# Patient Record
Sex: Male | Born: 1965
Health system: Southern US, Community
[De-identification: ages and names within clinical notes are randomized; demographics above are authoritative.]

## PROBLEM LIST (undated history)

## (undated) DIAGNOSIS — M7710 Lateral epicondylitis, unspecified elbow: Secondary | ICD-10-CM

## (undated) DIAGNOSIS — R918 Other nonspecific abnormal finding of lung field: Secondary | ICD-10-CM

## (undated) DIAGNOSIS — M5126 Other intervertebral disc displacement, lumbar region: Secondary | ICD-10-CM

## (undated) DIAGNOSIS — K222 Esophageal obstruction: Secondary | ICD-10-CM

## (undated) DIAGNOSIS — J45991 Cough variant asthma: Secondary | ICD-10-CM

## (undated) DIAGNOSIS — M5416 Radiculopathy, lumbar region: Secondary | ICD-10-CM

## (undated) DIAGNOSIS — K219 Gastro-esophageal reflux disease without esophagitis: Secondary | ICD-10-CM

## (undated) DIAGNOSIS — Z5189 Encounter for other specified aftercare: Secondary | ICD-10-CM

## (undated) DIAGNOSIS — K449 Diaphragmatic hernia without obstruction or gangrene: Secondary | ICD-10-CM

## (undated) DIAGNOSIS — Z86010 Personal history of colonic polyps: Secondary | ICD-10-CM

## (undated) DIAGNOSIS — Z9889 Other specified postprocedural states: Secondary | ICD-10-CM

## (undated) DIAGNOSIS — T7840XA Allergy, unspecified, initial encounter: Secondary | ICD-10-CM

## (undated) DIAGNOSIS — R945 Abnormal results of liver function studies: Secondary | ICD-10-CM

## (undated) DIAGNOSIS — I10 Essential (primary) hypertension: Secondary | ICD-10-CM

## (undated) DIAGNOSIS — K589 Irritable bowel syndrome without diarrhea: Secondary | ICD-10-CM

## (undated) DIAGNOSIS — R112 Nausea with vomiting, unspecified: Secondary | ICD-10-CM

## (undated) DIAGNOSIS — E785 Hyperlipidemia, unspecified: Secondary | ICD-10-CM

## (undated) DIAGNOSIS — K769 Liver disease, unspecified: Secondary | ICD-10-CM

## (undated) DIAGNOSIS — R7989 Other specified abnormal findings of blood chemistry: Secondary | ICD-10-CM

## (undated) DIAGNOSIS — E538 Deficiency of other specified B group vitamins: Secondary | ICD-10-CM

## (undated) HISTORY — DX: Cough variant asthma: J45.991

## (undated) HISTORY — DX: Irritable bowel syndrome, unspecified: K58.9

## (undated) HISTORY — PX: POLYPECTOMY: SHX149

## (undated) HISTORY — PX: COLON SURGERY: SHX602

## (undated) HISTORY — PX: HERNIA REPAIR: SHX51

## (undated) HISTORY — PX: LUMBAR LAMINECTOMY: SHX95

## (undated) HISTORY — DX: Diaphragmatic hernia without obstruction or gangrene: K44.9

## (undated) HISTORY — DX: Essential (primary) hypertension: I10

## (undated) HISTORY — DX: Hyperlipidemia, unspecified: E78.5

## (undated) HISTORY — DX: Other specified abnormal findings of blood chemistry: R79.89

## (undated) HISTORY — PX: SPINE SURGERY: SHX786

## (undated) HISTORY — DX: Lateral epicondylitis, unspecified elbow: M77.10

## (undated) HISTORY — DX: Gastro-esophageal reflux disease without esophagitis: K21.9

## (undated) HISTORY — DX: Deficiency of other specified B group vitamins: E53.8

## (undated) HISTORY — DX: Esophageal obstruction: K22.2

## (undated) HISTORY — DX: Personal history of colonic polyps: Z86.010

## (undated) HISTORY — PX: LEG SURGERY: SHX1003

## (undated) HISTORY — DX: Liver disease, unspecified: K76.9

## (undated) HISTORY — PX: VENTRAL HERNIA REPAIR: SHX424

## (undated) HISTORY — DX: Abnormal results of liver function studies: R94.5

## (undated) HISTORY — DX: Encounter for other specified aftercare: Z51.89

## (undated) HISTORY — PX: UPPER GASTROINTESTINAL ENDOSCOPY: SHX188

## (undated) HISTORY — PX: COLONOSCOPY: SHX174

## (undated) HISTORY — DX: Allergy, unspecified, initial encounter: T78.40XA

## (undated) HISTORY — PX: OTHER SURGICAL HISTORY: SHX169

---
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
## 1999-07-27 ENCOUNTER — Encounter: Payer: Self-pay | Admitting: Emergency Medicine

## 1999-07-27 ENCOUNTER — Inpatient Hospital Stay (HOSPITAL_COMMUNITY): Admission: EM | Admit: 1999-07-27 | Discharge: 1999-08-04 | Payer: Self-pay | Admitting: Emergency Medicine

## 1999-07-27 ENCOUNTER — Encounter (INDEPENDENT_AMBULATORY_CARE_PROVIDER_SITE_OTHER): Payer: Self-pay | Admitting: *Deleted

## 1999-07-28 ENCOUNTER — Encounter: Payer: Self-pay | Admitting: Orthopaedic Surgery

## 2000-05-22 ENCOUNTER — Encounter (INDEPENDENT_AMBULATORY_CARE_PROVIDER_SITE_OTHER): Payer: Self-pay | Admitting: *Deleted

## 2000-05-23 ENCOUNTER — Inpatient Hospital Stay (HOSPITAL_COMMUNITY): Admission: RE | Admit: 2000-05-23 | Discharge: 2000-05-24 | Payer: Self-pay | Admitting: Surgery

## 2000-10-29 ENCOUNTER — Ambulatory Visit (HOSPITAL_COMMUNITY): Admission: RE | Admit: 2000-10-29 | Discharge: 2000-10-29 | Payer: Self-pay | Admitting: Neurosurgery

## 2000-10-29 ENCOUNTER — Encounter: Payer: Self-pay | Admitting: Neurosurgery

## 2000-11-12 ENCOUNTER — Encounter: Payer: Self-pay | Admitting: Neurosurgery

## 2000-11-12 ENCOUNTER — Ambulatory Visit (HOSPITAL_COMMUNITY): Admission: RE | Admit: 2000-11-12 | Discharge: 2000-11-12 | Payer: Self-pay | Admitting: Neurosurgery

## 2000-11-26 ENCOUNTER — Ambulatory Visit (HOSPITAL_COMMUNITY): Admission: RE | Admit: 2000-11-26 | Discharge: 2000-11-26 | Payer: Self-pay | Admitting: Neurosurgery

## 2000-11-26 ENCOUNTER — Encounter: Payer: Self-pay | Admitting: Neurosurgery

## 2003-10-17 ENCOUNTER — Encounter (INDEPENDENT_AMBULATORY_CARE_PROVIDER_SITE_OTHER): Payer: Self-pay | Admitting: *Deleted

## 2003-10-17 ENCOUNTER — Inpatient Hospital Stay (HOSPITAL_COMMUNITY): Admission: RE | Admit: 2003-10-17 | Discharge: 2003-10-19 | Payer: Self-pay | Admitting: Surgery

## 2004-07-24 ENCOUNTER — Ambulatory Visit: Payer: Self-pay | Admitting: Internal Medicine

## 2004-09-03 ENCOUNTER — Ambulatory Visit: Payer: Self-pay | Admitting: Internal Medicine

## 2004-09-27 ENCOUNTER — Encounter: Admission: RE | Admit: 2004-09-27 | Discharge: 2004-10-17 | Payer: Self-pay | Admitting: Internal Medicine

## 2004-12-26 ENCOUNTER — Ambulatory Visit: Payer: Self-pay | Admitting: Internal Medicine

## 2005-01-02 ENCOUNTER — Ambulatory Visit: Payer: Self-pay | Admitting: Internal Medicine

## 2005-01-29 ENCOUNTER — Ambulatory Visit: Payer: Self-pay | Admitting: Internal Medicine

## 2005-03-06 ENCOUNTER — Emergency Department (HOSPITAL_COMMUNITY): Admission: EM | Admit: 2005-03-06 | Discharge: 2005-03-06 | Payer: Self-pay | Admitting: *Deleted

## 2005-05-22 ENCOUNTER — Ambulatory Visit: Payer: Self-pay | Admitting: Internal Medicine

## 2005-07-18 ENCOUNTER — Ambulatory Visit: Payer: Self-pay | Admitting: Internal Medicine

## 2005-07-29 ENCOUNTER — Ambulatory Visit: Payer: Self-pay | Admitting: Internal Medicine

## 2005-08-19 ENCOUNTER — Ambulatory Visit: Payer: Self-pay | Admitting: Internal Medicine

## 2005-10-16 ENCOUNTER — Ambulatory Visit: Payer: Self-pay | Admitting: Internal Medicine

## 2005-11-27 ENCOUNTER — Encounter: Admission: RE | Admit: 2005-11-27 | Discharge: 2005-11-27 | Payer: Self-pay | Admitting: Occupational Medicine

## 2006-02-05 ENCOUNTER — Ambulatory Visit: Payer: Self-pay | Admitting: Internal Medicine

## 2006-04-15 ENCOUNTER — Ambulatory Visit: Payer: Self-pay | Admitting: Internal Medicine

## 2006-05-01 ENCOUNTER — Ambulatory Visit: Payer: Self-pay | Admitting: Internal Medicine

## 2006-05-07 ENCOUNTER — Encounter: Admission: RE | Admit: 2006-05-07 | Discharge: 2006-05-07 | Payer: Self-pay | Admitting: Internal Medicine

## 2006-11-03 ENCOUNTER — Ambulatory Visit (HOSPITAL_COMMUNITY): Admission: RE | Admit: 2006-11-03 | Discharge: 2006-11-05 | Payer: Self-pay | Admitting: Neurosurgery

## 2007-03-05 ENCOUNTER — Ambulatory Visit: Payer: Self-pay | Admitting: Internal Medicine

## 2007-03-05 LAB — CONVERTED CEMR LAB
ALT: 40 units/L (ref 0–40)
Albumin: 4.4 g/dL (ref 3.5–5.2)
Bilirubin, Direct: 0.1 mg/dL (ref 0.0–0.3)
CO2: 32 meq/L (ref 19–32)
Direct LDL: 170.9 mg/dL
GFR calc non Af Amer: 87 mL/min
HDL: 38.6 mg/dL — ABNORMAL LOW (ref >39.0)
Lymphocytes Relative: 29.2 % (ref 12.0–46.0)
MCV: 91.2 fL (ref 78.0–100.0)
Monocytes Relative: 8.3 % (ref 3.0–11.0)
Neutro Abs: 3.4 10*3/uL (ref 1.4–7.7)
Neutrophils Relative %: 59.2 % (ref 43.0–77.0)
Platelets: 257 10*3/uL (ref 150–400)
Potassium: 4.2 meq/L (ref 3.5–5.1)
RBC: 5.05 M/uL (ref 4.22–5.81)
RDW: 11.8 % (ref 11.5–14.6)
Sodium: 143 meq/L (ref 135–145)
Total Bilirubin: 1.2 mg/dL (ref 0.3–1.2)
Total CHOL/HDL Ratio: 6.6
Total Protein: 7.1 g/dL (ref 6.0–8.3)
VLDL: 32 mg/dL (ref 0–40)

## 2007-03-12 ENCOUNTER — Ambulatory Visit: Payer: Self-pay | Admitting: Internal Medicine

## 2007-05-12 DIAGNOSIS — E785 Hyperlipidemia, unspecified: Secondary | ICD-10-CM | POA: Insufficient documentation

## 2007-05-12 DIAGNOSIS — K219 Gastro-esophageal reflux disease without esophagitis: Secondary | ICD-10-CM | POA: Insufficient documentation

## 2007-08-24 ENCOUNTER — Ambulatory Visit: Payer: Self-pay | Admitting: Internal Medicine

## 2007-08-24 DIAGNOSIS — M545 Low back pain, unspecified: Secondary | ICD-10-CM | POA: Insufficient documentation

## 2007-09-02 ENCOUNTER — Encounter: Payer: Self-pay | Admitting: Internal Medicine

## 2007-09-02 ENCOUNTER — Encounter: Admission: RE | Admit: 2007-09-02 | Discharge: 2007-09-02 | Payer: Self-pay | Admitting: Neurosurgery

## 2007-09-03 ENCOUNTER — Ambulatory Visit: Payer: Self-pay | Admitting: Internal Medicine

## 2007-09-15 ENCOUNTER — Encounter: Payer: Self-pay | Admitting: Internal Medicine

## 2007-09-27 ENCOUNTER — Encounter: Payer: Self-pay | Admitting: Internal Medicine

## 2007-10-12 ENCOUNTER — Ambulatory Visit: Payer: Self-pay | Admitting: Internal Medicine

## 2007-10-12 DIAGNOSIS — J45991 Cough variant asthma: Secondary | ICD-10-CM

## 2007-10-12 HISTORY — DX: Cough variant asthma: J45.991

## 2007-11-01 ENCOUNTER — Encounter: Payer: Self-pay | Admitting: Internal Medicine

## 2007-12-31 ENCOUNTER — Encounter: Payer: Self-pay | Admitting: Internal Medicine

## 2008-02-24 ENCOUNTER — Encounter: Payer: Self-pay | Admitting: Internal Medicine

## 2008-03-21 ENCOUNTER — Ambulatory Visit (HOSPITAL_COMMUNITY): Admission: RE | Admit: 2008-03-21 | Discharge: 2008-03-21 | Payer: Self-pay | Admitting: Neurosurgery

## 2008-04-03 ENCOUNTER — Encounter: Payer: Self-pay | Admitting: Internal Medicine

## 2008-05-08 ENCOUNTER — Encounter: Payer: Self-pay | Admitting: Internal Medicine

## 2008-06-07 ENCOUNTER — Ambulatory Visit: Payer: Self-pay | Admitting: Internal Medicine

## 2008-06-07 DIAGNOSIS — K644 Residual hemorrhoidal skin tags: Secondary | ICD-10-CM | POA: Insufficient documentation

## 2008-06-07 DIAGNOSIS — L255 Unspecified contact dermatitis due to plants, except food: Secondary | ICD-10-CM | POA: Insufficient documentation

## 2008-06-07 DIAGNOSIS — K5909 Other constipation: Secondary | ICD-10-CM | POA: Insufficient documentation

## 2008-08-14 ENCOUNTER — Encounter: Payer: Self-pay | Admitting: Internal Medicine

## 2008-08-18 ENCOUNTER — Ambulatory Visit: Payer: Self-pay | Admitting: Internal Medicine

## 2008-08-18 LAB — CONVERTED CEMR LAB
BUN: 20 mg/dL (ref 6–23)
Bilirubin Urine: NEGATIVE
Blood in Urine, dipstick: NEGATIVE
CO2: 30 meq/L (ref 19–32)
Calcium: 8.9 mg/dL (ref 8.4–10.5)
Chloride: 106 meq/L (ref 96–112)
Creatinine, Ser: 1.1 mg/dL (ref 0.4–1.5)
Direct LDL: 133.7 mg/dL
GFR calc Af Amer: 94 mL/min
GFR calc non Af Amer: 77 mL/min
HCT: 42.1 % (ref 39.0–52.0)
Ketones, urine, test strip: NEGATIVE
Lymphocytes Relative: 30 % (ref 12.0–46.0)
MCHC: 35 g/dL (ref 30.0–36.0)
MCV: 90.5 fL (ref 78.0–100.0)
Monocytes Absolute: 0.5 10*3/uL (ref 0.1–1.0)
Monocytes Relative: 8 % (ref 3.0–12.0)
Platelets: 193 10*3/uL (ref 150–400)
RDW: 11.7 % (ref 11.5–14.6)
Sodium: 142 meq/L (ref 135–145)
Specific Gravity, Urine: 1.01
Total Bilirubin: 0.9 mg/dL (ref 0.3–1.2)
Total CHOL/HDL Ratio: 4.9
Total Protein: 6.5 g/dL (ref 6.0–8.3)
Triglycerides: 151 mg/dL — ABNORMAL HIGH (ref 0–149)
Urobilinogen, UA: 0.2
VLDL: 30 mg/dL (ref 0–40)
WBC Urine, dipstick: NEGATIVE

## 2008-08-25 ENCOUNTER — Ambulatory Visit: Payer: Self-pay | Admitting: Internal Medicine

## 2009-02-01 ENCOUNTER — Ambulatory Visit: Payer: Self-pay | Admitting: Family Medicine

## 2009-02-01 DIAGNOSIS — J029 Acute pharyngitis, unspecified: Secondary | ICD-10-CM | POA: Insufficient documentation

## 2009-02-01 DIAGNOSIS — J069 Acute upper respiratory infection, unspecified: Secondary | ICD-10-CM | POA: Insufficient documentation

## 2009-02-13 ENCOUNTER — Ambulatory Visit: Payer: Self-pay | Admitting: Internal Medicine

## 2009-02-13 LAB — CONVERTED CEMR LAB
ALT: 66 units/L — ABNORMAL HIGH (ref 0–53)
Alkaline Phosphatase: 64 units/L (ref 39–117)
Cholesterol: 245 mg/dL — ABNORMAL HIGH (ref 0–200)
HDL: 45.3 mg/dL (ref >39.00)
Total Bilirubin: 1.1 mg/dL (ref 0.3–1.2)
Total CHOL/HDL Ratio: 5
VLDL: 55.6 mg/dL — ABNORMAL HIGH (ref 0.0–40.0)

## 2009-02-20 ENCOUNTER — Ambulatory Visit: Payer: Self-pay | Admitting: Internal Medicine

## 2009-04-30 ENCOUNTER — Telehealth: Payer: Self-pay | Admitting: Internal Medicine

## 2009-04-30 ENCOUNTER — Ambulatory Visit: Payer: Self-pay | Admitting: Internal Medicine

## 2009-04-30 DIAGNOSIS — T169XXA Foreign body in ear, unspecified ear, initial encounter: Secondary | ICD-10-CM | POA: Insufficient documentation

## 2009-04-30 DIAGNOSIS — M542 Cervicalgia: Secondary | ICD-10-CM | POA: Insufficient documentation

## 2009-04-30 DIAGNOSIS — IMO0002 Reserved for concepts with insufficient information to code with codable children: Secondary | ICD-10-CM | POA: Insufficient documentation

## 2009-07-24 ENCOUNTER — Ambulatory Visit: Payer: Self-pay | Admitting: Internal Medicine

## 2009-07-24 DIAGNOSIS — J301 Allergic rhinitis due to pollen: Secondary | ICD-10-CM | POA: Insufficient documentation

## 2009-07-26 ENCOUNTER — Encounter (INDEPENDENT_AMBULATORY_CARE_PROVIDER_SITE_OTHER): Payer: Self-pay | Admitting: *Deleted

## 2009-07-27 ENCOUNTER — Encounter (INDEPENDENT_AMBULATORY_CARE_PROVIDER_SITE_OTHER): Payer: Self-pay | Admitting: *Deleted

## 2009-08-06 ENCOUNTER — Encounter (INDEPENDENT_AMBULATORY_CARE_PROVIDER_SITE_OTHER): Payer: Self-pay | Admitting: *Deleted

## 2009-08-31 ENCOUNTER — Ambulatory Visit: Payer: Self-pay | Admitting: Gastroenterology

## 2009-08-31 ENCOUNTER — Encounter (INDEPENDENT_AMBULATORY_CARE_PROVIDER_SITE_OTHER): Payer: Self-pay | Admitting: *Deleted

## 2009-08-31 DIAGNOSIS — R1311 Dysphagia, oral phase: Secondary | ICD-10-CM | POA: Insufficient documentation

## 2009-08-31 DIAGNOSIS — R7401 Elevation of levels of liver transaminase levels: Secondary | ICD-10-CM | POA: Insufficient documentation

## 2009-08-31 DIAGNOSIS — R740 Nonspecific elevation of levels of transaminase and lactic acid dehydrogenase [LDH]: Secondary | ICD-10-CM

## 2009-08-31 DIAGNOSIS — K589 Irritable bowel syndrome without diarrhea: Secondary | ICD-10-CM | POA: Insufficient documentation

## 2009-08-31 DIAGNOSIS — R079 Chest pain, unspecified: Secondary | ICD-10-CM | POA: Insufficient documentation

## 2009-08-31 DIAGNOSIS — R197 Diarrhea, unspecified: Secondary | ICD-10-CM | POA: Insufficient documentation

## 2009-08-31 DIAGNOSIS — R110 Nausea: Secondary | ICD-10-CM | POA: Insufficient documentation

## 2009-08-31 LAB — CONVERTED CEMR LAB: Hepatitis B Surface Ag: NEGATIVE

## 2009-09-05 DIAGNOSIS — E538 Deficiency of other specified B group vitamins: Secondary | ICD-10-CM | POA: Insufficient documentation

## 2009-09-05 LAB — CONVERTED CEMR LAB
ALT: 83 units/L — ABNORMAL HIGH (ref 0–53)
AST: 54 units/L — ABNORMAL HIGH (ref 0–37)
Albumin: 4.6 g/dL (ref 3.5–5.2)
BUN: 16 mg/dL (ref 6–23)
Bilirubin, Direct: 0.2 mg/dL (ref 0.0–0.3)
CO2: 31 meq/L (ref 19–32)
Calcium: 9.7 mg/dL (ref 8.4–10.5)
Ferritin: 101.8 ng/mL (ref 22.0–322.0)
Folate: 10.5 ng/mL
GFR calc non Af Amer: 85.87 mL/min (ref >60)
HCT: 47.1 % (ref 39.0–52.0)
Hemoglobin: 16.4 g/dL (ref 13.0–17.0)
IgA: 197 mg/dL (ref 68–378)
Lymphs Abs: 1.5 10*3/uL (ref 0.7–4.0)
MCV: 92.1 fL (ref 78.0–100.0)
Monocytes Absolute: 0.4 10*3/uL (ref 0.1–1.0)
Neutro Abs: 3.3 10*3/uL (ref 1.4–7.7)
RBC: 5.11 M/uL (ref 4.22–5.81)
Saturation Ratios: 21.6 % (ref 20.0–50.0)
Sodium: 141 meq/L (ref 135–145)
TSH: 1.5 microintl units/mL (ref 0.35–5.50)
Total Protein: 7.3 g/dL (ref 6.0–8.3)

## 2009-09-07 ENCOUNTER — Ambulatory Visit: Payer: Self-pay | Admitting: Gastroenterology

## 2009-09-13 ENCOUNTER — Ambulatory Visit: Payer: Self-pay | Admitting: Gastroenterology

## 2009-09-24 ENCOUNTER — Ambulatory Visit: Payer: Self-pay | Admitting: Gastroenterology

## 2009-09-28 ENCOUNTER — Ambulatory Visit: Payer: Self-pay | Admitting: Gastroenterology

## 2009-09-28 DIAGNOSIS — Z860100 Personal history of colon polyps, unspecified: Secondary | ICD-10-CM

## 2009-09-28 HISTORY — DX: Personal history of colon polyps, unspecified: Z86.0100

## 2009-09-28 HISTORY — DX: Personal history of colonic polyps: Z86.010

## 2009-10-02 ENCOUNTER — Encounter: Payer: Self-pay | Admitting: Gastroenterology

## 2009-10-25 ENCOUNTER — Ambulatory Visit: Payer: Self-pay | Admitting: Gastroenterology

## 2009-11-22 ENCOUNTER — Ambulatory Visit: Payer: Self-pay | Admitting: Gastroenterology

## 2009-11-28 ENCOUNTER — Ambulatory Visit: Payer: Self-pay | Admitting: Family Medicine

## 2009-12-24 ENCOUNTER — Ambulatory Visit: Payer: Self-pay | Admitting: Gastroenterology

## 2010-01-23 ENCOUNTER — Ambulatory Visit: Payer: Self-pay | Admitting: Gastroenterology

## 2010-02-25 ENCOUNTER — Ambulatory Visit: Payer: Self-pay | Admitting: Gastroenterology

## 2010-03-07 ENCOUNTER — Ambulatory Visit: Payer: Self-pay | Admitting: Family Medicine

## 2010-03-07 DIAGNOSIS — M7710 Lateral epicondylitis, unspecified elbow: Secondary | ICD-10-CM

## 2010-03-07 HISTORY — DX: Lateral epicondylitis, unspecified elbow: M77.10

## 2010-03-26 ENCOUNTER — Ambulatory Visit: Payer: Self-pay | Admitting: Gastroenterology

## 2010-04-25 ENCOUNTER — Ambulatory Visit: Payer: Self-pay | Admitting: Gastroenterology

## 2010-05-23 ENCOUNTER — Ambulatory Visit: Payer: Self-pay | Admitting: Gastroenterology

## 2010-06-13 ENCOUNTER — Ambulatory Visit: Payer: Self-pay | Admitting: Internal Medicine

## 2010-06-20 ENCOUNTER — Ambulatory Visit: Payer: Self-pay | Admitting: Gastroenterology

## 2010-07-18 ENCOUNTER — Ambulatory Visit: Payer: Self-pay | Admitting: Gastroenterology

## 2010-08-14 ENCOUNTER — Ambulatory Visit: Payer: Self-pay | Admitting: Gastroenterology

## 2010-09-10 ENCOUNTER — Encounter: Payer: Self-pay | Admitting: Internal Medicine

## 2010-09-11 ENCOUNTER — Ambulatory Visit: Payer: Self-pay | Admitting: Gastroenterology

## 2010-09-11 ENCOUNTER — Telehealth: Payer: Self-pay | Admitting: Internal Medicine

## 2010-09-12 ENCOUNTER — Encounter: Payer: Self-pay | Admitting: Internal Medicine

## 2010-09-18 ENCOUNTER — Ambulatory Visit
Admission: RE | Admit: 2010-09-18 | Discharge: 2010-09-18 | Payer: Self-pay | Source: Home / Self Care | Attending: Family Medicine | Admitting: Family Medicine

## 2010-10-03 ENCOUNTER — Ambulatory Visit
Admission: RE | Admit: 2010-10-03 | Discharge: 2010-10-03 | Payer: Self-pay | Source: Home / Self Care | Attending: Gastroenterology | Admitting: Gastroenterology

## 2010-10-03 ENCOUNTER — Other Ambulatory Visit: Payer: Self-pay | Admitting: Gastroenterology

## 2010-10-03 DIAGNOSIS — Z86010 Personal history of colonic polyps: Secondary | ICD-10-CM | POA: Insufficient documentation

## 2010-10-03 LAB — HEPATIC FUNCTION PANEL
ALT: 61 U/L — ABNORMAL HIGH (ref 0–53)
AST: 34 U/L (ref 0–37)
Albumin: 4.3 g/dL (ref 3.5–5.2)
Alkaline Phosphatase: 86 U/L (ref 39–117)
Bilirubin, Direct: 0.1 mg/dL (ref 0.0–0.3)
Total Bilirubin: 0.8 mg/dL (ref 0.3–1.2)
Total Protein: 7.1 g/dL (ref 6.0–8.3)

## 2010-10-03 LAB — IBC PANEL
Iron: 116 ug/dL (ref 42–165)
Saturation Ratios: 24.2 % (ref 20.0–50.0)
Transferrin: 342.1 mg/dL (ref 212.0–360.0)

## 2010-10-03 LAB — CBC WITH DIFFERENTIAL/PLATELET
Basophils Absolute: 0 10*3/uL (ref 0.0–0.1)
Basophils Relative: 0.5 % (ref 0.0–3.0)
Eosinophils Absolute: 0.1 10*3/uL (ref 0.0–0.7)
Eosinophils Relative: 1.3 % (ref 0.0–5.0)
HCT: 45.3 % (ref 39.0–52.0)
Hemoglobin: 15.9 g/dL (ref 13.0–17.0)
Lymphocytes Relative: 25.4 % (ref 12.0–46.0)
Lymphs Abs: 1.4 10*3/uL (ref 0.7–4.0)
MCHC: 35 g/dL (ref 30.0–36.0)
MCV: 91.2 fl (ref 78.0–100.0)
Monocytes Absolute: 0.5 10*3/uL (ref 0.1–1.0)
Monocytes Relative: 8.8 % (ref 3.0–12.0)
Neutro Abs: 3.5 10*3/uL (ref 1.4–7.7)
Neutrophils Relative %: 64 % (ref 43.0–77.0)
Platelets: 229 10*3/uL (ref 150.0–400.0)
RBC: 4.97 Mil/uL (ref 4.22–5.81)
RDW: 13 % (ref 11.5–14.6)
WBC: 5.5 10*3/uL (ref 4.5–10.5)

## 2010-10-03 LAB — BASIC METABOLIC PANEL
BUN: 20 mg/dL (ref 6–23)
CO2: 31 mEq/L (ref 19–32)
Calcium: 9.3 mg/dL (ref 8.4–10.5)
Chloride: 100 mEq/L (ref 96–112)
Creatinine, Ser: 1 mg/dL (ref 0.4–1.5)
GFR: 85.45 mL/min (ref >60.00)
Glucose, Bld: 83 mg/dL (ref 70–99)
Potassium: 4.5 mEq/L (ref 3.5–5.1)
Sodium: 138 mEq/L (ref 135–145)

## 2010-10-03 LAB — FERRITIN: Ferritin: 106.1 ng/mL (ref 22.0–322.0)

## 2010-10-03 LAB — TSH: TSH: 1.68 u[IU]/mL (ref 0.35–5.50)

## 2010-10-03 LAB — FOLATE: Folate: 9.3 ng/mL

## 2010-10-03 LAB — VITAMIN B12: Vitamin B-12: 417 pg/mL (ref 211–911)

## 2010-10-07 ENCOUNTER — Ambulatory Visit
Admission: RE | Admit: 2010-10-07 | Discharge: 2010-10-07 | Payer: Self-pay | Source: Home / Self Care | Attending: Gastroenterology | Admitting: Gastroenterology

## 2010-10-07 ENCOUNTER — Encounter: Payer: Self-pay | Admitting: Gastroenterology

## 2010-10-07 LAB — CONVERTED CEMR LAB
Anti Nuclear Antibody(ANA): NEGATIVE
Ceruloplasmin: 26 mg/dL (ref 21–63)

## 2010-10-14 ENCOUNTER — Ambulatory Visit
Admission: RE | Admit: 2010-10-14 | Discharge: 2010-10-14 | Payer: Self-pay | Source: Home / Self Care | Attending: Internal Medicine | Admitting: Internal Medicine

## 2010-10-17 ENCOUNTER — Ambulatory Visit (HOSPITAL_COMMUNITY)
Admission: RE | Admit: 2010-10-17 | Discharge: 2010-10-17 | Payer: Self-pay | Source: Home / Self Care | Attending: Gastroenterology | Admitting: Gastroenterology

## 2010-10-17 ENCOUNTER — Encounter: Payer: Self-pay | Admitting: Gastroenterology

## 2010-10-21 ENCOUNTER — Ambulatory Visit (HOSPITAL_COMMUNITY)
Admission: RE | Admit: 2010-10-21 | Discharge: 2010-10-21 | Payer: Self-pay | Source: Home / Self Care | Attending: Gastroenterology | Admitting: Gastroenterology

## 2010-10-22 NOTE — Assessment & Plan Note (Signed)
Summary: elbow pain/njr   Vital Signs:  Patient profile:   45 year old male Height:      71 inches Weight:      192 pounds BMI:     26.88 Temp:     98.2 degrees F oral Pulse rate:   76 / minute Resp:     14 per minute BP sitting:   140 / 84  (left arm)  Vitals Entered By: Willy Eddy, LPN (June 13, 2010 11:41 AM) CC: c/o rt elbow pain- was given depo injection in June by Dr Caryl Never wchich helped and now pain has returned Is Patient Diabetic? No   Primary Care Provider:  Darryll Capers, MD  CC:  c/o rt elbow pain- was given depo injection in June by Dr Caryl Never wchich helped and now pain has returned.  History of Present Illness: Has been seen befor for epicodylitis prior injection helped but  the injury has recurred acute increase in pain no redness or cellulitis  Preventive Screening-Counseling & Management  Alcohol-Tobacco     Smoking Status: never     Tobacco Counseling: not indicated; no tobacco use  Problems Prior to Update: 1)  Lateral Epicondylitis  (ICD-726.32) 2)  B12 Deficiency  (ICD-266.2) 3)  Dysphagia Oral Phase  (ICD-787.21) 4)  Ibs  (ICD-564.1) 5)  Transaminases, Serum, Elevated  (ICD-790.4) 6)  Nausea  (ICD-787.02) 7)  Diarrhea  (ICD-787.91) 8)  Chest Pain Unspecified  (ICD-786.50) 9)  Allergic Rhinitis, Seasonal  (ICD-477.0) 10)  Foreign Body in Ear  (ICD-931) 11)  Fce Nck&sclp No Eye Abras/fric Burn w/o Inf  (ICD-910.0) 12)  Neck Pain, Acute  (ICD-723.1) 13)  Uri  (ICD-465.9) 14)  Acute Pharyngitis  (ICD-462) 15)  Health Maintenance Exam  (ICD-V70.0) 16)  Plant Dermatitis  (ICD-692.6) 17)  Hemorrhoids, External  (ICD-455.3) 18)  Constipation, Chronic  (ICD-564.09) 19)  Cough Variant Asthma  (ICD-493.82) 20)  Low Back Pain  (ICD-724.2) 21)  Hyperlipidemia  (ICD-272.4) 22)  Gerd  (ICD-530.81)  Medications Prior to Update: 1)  Zegerid 40-1100 Mg Caps (Omeprazole-Sodium Bicarbonate) .... One By Mouth Two Times A Day Failed Prilosec  and Protonix 2)  Niacin 500 Mg Tabs (Niacin) .... Take 1 Tablet By Mouth Once Daily 3)  Fish Oil 500 Mg Caps (Omega-3 Fatty Acids) .... Take 1 Capsule By Mouth Once Daily 4)  Flexeril 10 Mg Tabs (Cyclobenzaprine Hcl) .... Take One Tab Every 8 Hours As Needed For Muscle Spasm 5)  Lorcet 10/650 10-650 Mg Tabs (Hydrocodone-Acetaminophen) .... Take One Tab Every 6 Hours As Needed For Pain 6)  Benefiber  Pack (Guar Gum) .... One Tbsp Into Cereal Once Daily  Current Medications (verified): 1)  Zegerid 40-1100 Mg Caps (Omeprazole-Sodium Bicarbonate) .... One By Mouth Two Times A Day Failed Prilosec and Protonix 2)  Niacin 500 Mg Tabs (Niacin) .... Take 1 Tablet By Mouth Once Daily 3)  Fish Oil 500 Mg Caps (Omega-3 Fatty Acids) .... Take 1 Capsule By Mouth Once Daily 4)  Flexeril 10 Mg Tabs (Cyclobenzaprine Hcl) .... Take One Tab Every 8 Hours As Needed For Muscle Spasm 5)  Lorcet 10/650 10-650 Mg Tabs (Hydrocodone-Acetaminophen) .... Take One Tab Every 6 Hours As Needed For Pain 6)  Benefiber  Pack (Guar Gum) .... One Tbsp Into Cereal Once Daily  Allergies (verified): No Known Drug Allergies  Past History:  Family History: Last updated: 08/31/2009 Family History High cholesterol Family History Hypertension No FH of Colon Cancer:  Social History: Last updated: 08/31/2009 Occupation: Personnel officer  Married Never Smoked Alcohol use-yes-occsaion Daily Caffeine Use-1 drink daily Illicit Drug Use - no  Risk Factors: Smoking Status: never (06/13/2010)  Past medical, surgical, family and social histories (including risk factors) reviewed, and no changes noted (except as noted below). Family history reviewed for relevance to current acute and chronic problems.  Past Medical History: Reviewed history from 08/24/2007 and no changes required. GERD Hyperlipidemia Low back pain  Past Surgical History: Reviewed history from 08/31/2009 and no changes required. Ll2 back surgery trauma to  stomach/ Exploration Laparotomy and Small Bowel resection Had ventral hernia repairs Left leg surgery  Family History: Reviewed history from 08/31/2009 and no changes required. Family History High cholesterol Family History Hypertension No FH of Colon Cancer:  Social History: Reviewed history from 08/31/2009 and no changes required. Occupation: Personnel officer Married Never Smoked Alcohol use-yes-occsaion Daily Caffeine Use-1 drink daily Illicit Drug Use - no  Review of Systems  The patient denies anorexia, fever, weight loss, weight gain, vision loss, decreased hearing, hoarseness, chest pain, syncope, dyspnea on exertion, peripheral edema, prolonged cough, headaches, hemoptysis, abdominal pain, melena, hematochezia, severe indigestion/heartburn, hematuria, incontinence, genital sores, muscle weakness, suspicious skin lesions, transient blindness, difficulty walking, depression, unusual weight change, abnormal bleeding, enlarged lymph nodes, angioedema, breast masses, and testicular masses.    Physical Exam  General:  Well-developed,well-nourished,in no acute distress; alert,appropriate and cooperative throughout examination Eyes:  pupils equal and pupils round.   Ears:  R ear normal and L ear normal.   Nose:  no external deformity and no nasal discharge.   Mouth:  good dentition and pharynx pink and moist.   Lungs:  Normal respiratory effort, chest expands symmetrically. Lungs are clear to auscultation, no crackles or wheezes. Heart:  Normal rate and regular rhythm. S1 and S2 normal without gallop, murmur, click, rub or other extra sounds. Abdomen:  incisional hernia.   Msk:  decreased ROM and joint tenderness.   Pulses:  R and L carotid,radial,femoral,dorsalis pedis and posterior tibial pulses are full and equal bilaterally Extremities:  No clubbing, cyanosis, edema, or deformity noted with normal full range of motion of all joints.     Impression & Recommendations:  Problem #  1:  LATERAL EPICONDYLITIS (ICD-726.32) Assessment Deteriorated  instructed to wear the brace on a regular basis Informed consent obtained and then the reight epicodyle area and BR tendont was prepped in a sterile manor and 40 mg depo and 1/2 cc 1% lidocaine injected into the synovial space. After care discussed. Pt tolerated procedure well.  pt to ice the site at nigth fro 15 min  Orders: Trigger Point Injection Single Tendon Origin/Insertion [phone]) Depo- Medrol 40mg  (J1030)  Complete Medication List: 1)  Zegerid 40-1100 Mg Caps (Omeprazole-sodium bicarbonate) .... One by mouth two times a day failed prilosec and protonix 2)  Niacin 500 Mg Tabs (Niacin) .... Take 1 tablet by mouth once daily 3)  Fish Oil 500 Mg Caps (Omega-3 fatty acids) .... Take 1 capsule by mouth once daily 4)  Flexeril 10 Mg Tabs (Cyclobenzaprine hcl) .... Take one tab every 8 hours as needed for muscle spasm 5)  Lorcet 10/650 10-650 Mg Tabs (Hydrocodone-acetaminophen) .... Take one tab every 6 hours as needed for pain 6)  Benefiber Pack (Guar gum) .... One tbsp into cereal once daily

## 2010-10-22 NOTE — Procedures (Signed)
Summary: Upper Endoscopy  Patient: Bradley [name] Note: All result statuses are Final unless otherwise noted.  Tests: (1) Upper Endoscopy (EGD)   EGD Upper Endoscopy       DONE     [facility]     [address]     [place], [place]  [address]           ENDOSCOPY PROCEDURE REPORT           PATIENT:  Bradley [name], Bradley [name]  MR#:  [id number]     BIRTHDATE:  07-18-1966, 43 yrs. old  GENDER:  male           ENDOSCOPIST:  Vania Rea. Jarold Motto, MD, [facility]     Referred by:           PROCEDURE DATE:  09/28/2009     PROCEDURE:  EGD, diagnostic, Maloney Dilation of Esophagus     ASA CLASS:  Class II     INDICATIONS:  abdominal pain, dysphagia, GERD           MEDICATIONS:   Fentanyl 25 mcg IV, Versed 2 mg IV, There was     residual sedation effect present from prior procedure.     TOPICAL ANESTHETIC:  Exactacain Spray           DESCRIPTION OF PROCEDURE:   After the risks benefits and     alternatives of the procedure were thoroughly explained, informed     consent was obtained.  The LB [id number]-H180 [id number] endoscope was     introduced through the mouth and advanced to the second portion of     the duodenum, without limitations.  The instrument was slowly     withdrawn as the mucosa was fully examined.     <<PROCEDUREIMAGES>>           A hiatal hernia was found. large 6cm HH NOTED.FREE PROFUSE REFLUX     NOTED.  A Schatzki's ring was found at the gastroesophageal     junction. Dilation with maloney dilator 18mm NO HEME NOTED OR     RESISTANCE.  Normal duodenal folds were noted.  The stomach was     entered and closely examined. The antrum, angularis, and lesser     curvature were well visualized, including a retroflexed view of     the cardia and fundus. The stomach wall was normally distensable.     The scope passed easily through the pylorus into the duodenum.     Retroflexed views revealed a hiatal hernia.    The scope was then     withdrawn from the patient and the procedure completed.         COMPLICATIONS:  None           ENDOSCOPIC IMPRESSION:     1) Hiatal hernia     2) Schatzki's ring at the gastroesophageal junction     3) Normal duodenal folds     4) Normal stomach     5) A hiatal hernia     CHRONIC GERD AND LARGE HH     RECOMMENDATIONS:     1) anti-reflux regimen to be follow     2) continue PPI     3) post dilation instructions     CONSIDER FUNDOPLICATION.           REPEAT EXAM:  No           ______________________________     Vania Rea. Jarold Motto, MD, [facility]  CC:  Stacie Glaze, MD           n.     Rosalie DoctorVania Rea. Patterson at 09/28/2009 02:35 PM           [name], [id number]  Note: An exclamation mark (!) indicates a result that was not dispersed into the flowsheet. Document Creation Date: 09/28/2009 2:35 PM _______________________________________________________________________  (1) Order result status: Final Collection or observation date-time: 09/28/2009 14:26 Requested date-time:  Receipt date-time:  Reported date-time:  Referring Physician:   Ordering Physician: Sheryn Bison [phone]) Specimen Source:  Source: Launa Grill Order Number: [id number] Lab site:

## 2010-10-22 NOTE — Procedures (Signed)
Summary: Colonoscopy  Patient: [name] Note: All result statuses are Final unless otherwise noted.  Tests: (1) Colonoscopy (COL)   COL Colonoscopy           DONE     [facility]     [address]     [place], [place]  [address]           COLONOSCOPY PROCEDURE REPORT           PATIENT:  Bradley [name], Bradley [name]  MR#:  #[id number]     BIRTHDATE:  ,  yrs. old  GENDER:           ENDOSCOPIST:  Vania Rea. Jarold Motto, MD, [facility]     Referred by:           PROCEDURE DATE:  09/28/2009     PROCEDURE:  Average-risk screening colonoscopy     G0121     ASA CLASS:  Class II     INDICATIONS:  change in bowel habits, constipation, Routine Risk     Screening           MEDICATIONS:   Fentanyl 75 mcg IV, Versed 10 mg           DESCRIPTION OF PROCEDURE:   After the risks benefits and     alternatives of the procedure were thoroughly explained, informed     consent was obtained.  Digital rectal exam was performed and     revealed no abnormalities.   The  endoscope was introduced through     the anus and advanced to the cecum, which was identified by both     the appendix and ileocecal valve, without limitations.  The     quality of the prep was excellent, using MoviPrep.  The instrument     was then slowly withdrawn as the colon was fully examined.     <<PROCEDUREIMAGES>>           FINDINGS:  A sessile polyp was found. 5mm polyp cold snare     excised.  This was otherwise a normal examination of the colon.     Retroflexed views in the rectum revealed hypertrophied anal     papillae.    The scope was then withdrawn from the patient and the     procedure completed.           COMPLICATIONS:  None           ENDOSCOPIC IMPRESSION:     1) Sessile polyp     2) Otherwise normal examination     3) Hypertrophied anal papillae     RECOMMENDATIONS:     1) continue current medications     5y followup           REPEAT EXAM:  No           ______________________________     Vania Rea. Jarold Motto, MD,  [facility]           CC:  Stacie Glaze, MD           n.     Rosalie DoctorMarland Kitchen   Vania Rea. Lennis Korb at 09/28/2009 02:21 PM           [name], #[id number]  Note: An exclamation mark (!) indicates a result that was not dispersed into the flowsheet. Document Creation Date: 09/28/2009 3:44 PM _______________________________________________________________________  (1) Order result status: Final Collection or observation date-time: 09/28/2009 14:11 Requested date-time:  Receipt date-time:  Reported date-time:  Referring Physician:   Ordering  Physician: Sheryn Bison [phone]) Specimen Source:  Source: Launa Grill Order Number: [id number] Lab site:   Appended Document: Colonoscopy     Procedures Next Due Date:    Colonoscopy: 09/2014

## 2010-10-22 NOTE — Assessment & Plan Note (Signed)
Summary: R ELBOW PAIN // RS   Vital Signs:  Patient profile:   45 year old male Temp:     97.8 degrees F oral BP sitting:   150 / 100  (left arm) Cuff size:   regular  Vitals Entered By: Sid Falcon LPN (March 07, 2010 1:53 PM) CC: Right medial elbow pain   History of Present Illness: Patient seen right elbow pain. Right hand dominant. No injury. Duration 2 months. Moderate severity. Worse with gripping. Achy soreness. Pain is lateral elbow region. No significant radiation.  over-the-counter medications without relief. 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  Allergies (verified): No Known Drug Allergies  Past History:  Past Medical History: Last updated: 08/24/2007 GERD Hyperlipidemia Low back pain  Review of Systems      See HPI  Physical Exam  General:  Well-developed,well-nourished,in no acute distress; alert,appropriate and cooperative throughout examination Extremities:  right elbow reveals no visible swelling or erythema. Full range of motion. Tenderness lateral epicondylar region. No medial tenderness. Pain with wrist extension against resistance but not with flexion.   Impression & Recommendations:  Problem # 1:  LATERAL EPICONDYLITIS (ICD-726.32) Assessment New  risk and benefits of corticosteroid injection reviewed and patient consents.  Prepped right elbow with Betadine. Injected 40 mg Depo-Medrol and one and 1/2 cc plain Xylocaine. Recommend icing and tennis elbow strap.  Orders: Injection, Tendon / Ligament (04540) Depo- Medrol 40mg  (J1030)  Complete Medication List: 1)  Zegerid 40-1100 Mg Caps (Omeprazole-sodium bicarbonate) .... One by mouth two times a day failed prilosec and protonix 2)  Niacin 500 Mg Tabs (Niacin) .... Take 1 tablet by mouth once daily 3)  Fish Oil 500 Mg Caps (Omega-3 fatty acids) .... Take 1 capsule by mouth once daily 4)  Flexeril 10 Mg Tabs (Cyclobenzaprine hcl) .... Take one tab  every 8 hours as needed for muscle spasm 5)  Lorcet 10/650 10-650 Mg Tabs (Hydrocodone-acetaminophen) .... Take one tab every 6 hours as needed for pain 6)  Benefiber Pack (Guar gum) .... One tbsp into cereal once daily  Patient Instructions: 1)  Ice right elbow 2-3 times daily. 2)  Consider tennis elbow strap 3)  Avoid gripping with right hand is much as possible.

## 2010-10-22 NOTE — Assessment & Plan Note (Signed)
Summary: MONTHLY B12 SHOT...LSW.  Nurse Visit   Medication Administration  Injection # 1:    Medication: Vit B12 1000 mcg    Diagnosis: B12 DEFICIENCY (ICD-266.2)    Route: IM    Site: L deltoid    Exp Date: 04/2012    Lot #: [id number]    Mfr: [employer]    Comments: PT WILL RETURN ON 09/11/10 FOR NEXT INJECTION.    Patient tolerated injection without complications    Given by: Francee Piccolo CMA Duncan Dull) (August 14, 2010 8:48 AM)  Orders Added: 1)  Vit B12 1000 mcg [J3420]

## 2010-10-22 NOTE — Assessment & Plan Note (Signed)
Summary: MONTHLY B12 SHOT..LSW.  Nurse Visit   Allergies: No Known Drug Allergies  Medication Administration  Injection # 1:    Medication: Vit B12 1000 mcg    Diagnosis: B12 DEFICIENCY (ICD-266.2)    Route: IM    Site: L deltoid    Exp Date: 03/22/2012    Lot #: [id number]    Mfr: [employer]    Comments: Made pt appt for next monthly B12 on 08-14-10 @ 8:30 AM.    Patient tolerated injection without complications    Given by: Lowry Ram NCMA (July 19, 2010 8:21 AM)  Orders Added: 1)  Vit B12 1000 mcg [J3420]

## 2010-10-22 NOTE — Assessment & Plan Note (Signed)
Summary: MONTHLY B12 SHOT...LSW.  Nurse Visit   Medication Administration  Injection # 1:    Medication: Vit B12 1000 mcg    Diagnosis: B12 DEFICIENCY (ICD-266.2)    Route: IM    Site: R deltoid    Exp Date: 12/2011    Lot #: [id number]    Mfr: [employer]    Comments: Pt will return on 05/23/10 for next injection.    Patient tolerated injection without complications    Given by: Francee Piccolo CMA Duncan Dull) (April 25, 2010 9:16 AM)  Orders Added: 1)  Vit B12 1000 mcg [J3420]

## 2010-10-22 NOTE — Assessment & Plan Note (Signed)
Summary: MONTHLY B12 SHOT...LSW.  Nurse Visit  Medication Administration  Injection # 1:    Medication: Vit B12 1000 mcg    Diagnosis: B12 DEFICIENCY (ICD-266.2)    Route: IM    Site: L deltoid    Exp Date: 07/2011    Lot #: [id number]    Mfr: [employer]    Comments: Pt will return on 11/22/09 for next injection.    Patient tolerated injection without complications    Given by: Francee Piccolo CMA Duncan Dull) (October 25, 2009 8:38 AM)  Orders Added: 1)  Vit B12 1000 mcg [J3420]

## 2010-10-22 NOTE — Assessment & Plan Note (Signed)
Summary: MONTHLY B12 SHOT...LSW.  Nurse Visit   Allergies: No Known Drug Allergies  Medication Administration  Injection # 1:    Medication: Vit B12 1000 mcg    Diagnosis: B12 DEFICIENCY (ICD-266.2)    Route: IM    Site: L deltoid    Exp Date: 03/2012    Lot #: [id number]    Mfr: [employer]    Patient tolerated injection without complications    Given by: Milford Cage NCMA (June 20, 2010 8:36 AM)  Orders Added: 1)  Vit B12 1000 mcg [J3420]

## 2010-10-22 NOTE — Letter (Signed)
Summary: Patient Notice- Polyp Results  [facility]  [address] [place], [place] [address]   Phone: [phone]  Fax: [phone]        October 02, 2009 MRN: [id number]    Bradley [name] [address] [place], [place]  [address]    Dear Bradley [name],  I am pleased to inform you that the colon polyp(s) removed during your recent colonoscopy was (were) found to be benign (no cancer detected) upon pathologic examination.  I recommend you have a repeat colonoscopy examination in 5_ years to look for recurrent polyps, as having colon polyps increases your risk for having recurrent polyps or even colon cancer in the future.  Should you develop new or worsening symptoms of abdominal pain, bowel habit changes or bleeding from the rectum or bowels, please schedule an evaluation with either your primary care physician or with me.  Additional information/recommendations:  __ No further action with gastroenterology is needed at this time. Please      follow-up with your primary care physician for your other healthcare      needs.  __ Please call [phone] to schedule a return visit to review your      situation.  __ Please keep your follow-up visit as already scheduled.  x__ Continue treatment plan as outlined the day of your exam.  Please call us if you are having persistent problems or have questions about your condition that have not been fully answered at this time.  Sincerely,  Mardella Layman MD [facility]  This letter has been electronically signed by your physician.  Appended Document: Patient Notice- Polyp Results Letter mailed 01.12.11

## 2010-10-22 NOTE — Assessment & Plan Note (Signed)
Summary: Monthly B12  Nurse Visit   Allergies: No Known Drug Allergies  Medication Administration  Injection # 1:    Medication: Vit B12 1000 mcg    Diagnosis: B12 DEFICIENCY (ICD-266.2)    Route: IM    Site: L deltoid    Exp Date: 03/22/2012    Lot #: [id number]    Mfr: [employer]    Comments: Made pt appointment for next monthly B12 on 08-14-10 at 8:30 AM.     Patient tolerated injection without complications    Given by: Lowry Ram NCMA (July 18, 2010 8:39 AM)  Orders Added: 1)  Vit B12 1000 mcg [J3420]

## 2010-10-22 NOTE — Assessment & Plan Note (Signed)
Summary: Monthly B12/dfs  Nurse Visit   Allergies: No Known Drug Allergies  Medication Administration  Injection # 1:    Medication: Vit B12 1000 mcg    Diagnosis: B12 DEFICIENCY (ICD-266.2)    Route: IM    Site: L deltoid    Exp Date: 4/13    Lot #: [id number]    Mfr: [employer]    Patient tolerated injection without complications    Given by: Milford Cage NCMA (March 26, 2010 8:38 AM)  Orders Added: 1)  Vit B12 1000 mcg [J3420]

## 2010-10-22 NOTE — Assessment & Plan Note (Signed)
Summary: MONTHLY B12 SHOT...LSW.  Nurse Visit   Allergies: No Known Drug Allergies  Medication Administration  Injection # 1:    Medication: Vit B12 1000 mcg    Diagnosis: B12 DEFICIENCY (ICD-266.2)    Route: IM    Site: L deltoid    Exp Date: 12/12    Lot #: [id number]    Mfr: [employer]    Patient tolerated injection without complications    Given by: Milford Cage NCMA (Jan 23, 2010 9:02 AM)  Orders Added: 1)  Vit B12 1000 mcg [J3420]

## 2010-10-22 NOTE — Assessment & Plan Note (Signed)
Summary: B12 SHOT...AM.  Nurse Visit   Allergies: No Known Drug Allergies  Medication Administration  Injection # 1:    Medication: Vit B12 1000 mcg    Diagnosis: B12 DEFICIENCY (ICD-266.2)    Route: IM    Site: L deltoid    Exp Date: 08/23/2011    Lot #: [id number]    Mfr: [employer]    Patient tolerated injection without complications    Given by: Harlow Mares CMA (AAMA) (December 24, 2009 8:42 AM)

## 2010-10-22 NOTE — Assessment & Plan Note (Signed)
Summary: MONTHLY B12/266.2//SP  Nurse Visit   Allergies: No Known Drug Allergies  Medication Administration  Injection # 1:    Medication: Vit B12 1000 mcg    Diagnosis: B12 DEFICIENCY (ICD-266.2)    Route: IM    Site: L deltoid    Exp Date: 12/2011    Lot #: [id number]    Mfr: [employer]    Comments: pt to schedule the next monthly b12 at front desk    Patient tolerated injection without complications    Given by: Chales Abrahams CMA Duncan Dull) (May 23, 2010 8:37 AM)  Orders Added: 1)  Vit B12 1000 mcg [J3420]

## 2010-10-22 NOTE — Assessment & Plan Note (Signed)
Summary: 3RD B12 SHOT...AM.  Nurse Visit   Allergies: No Known Drug Allergies  Medication Administration  Injection # 1:    Medication: Vit B12 1000 mcg    Diagnosis: B12 DEFICIENCY (ICD-266.2)    Route: IM    Site: L deltoid    Exp Date: 8/12    Lot #: [id number]    Mfr: [employer]    Patient tolerated injection without complications    Given by: Milford Cage NCMA (September 24, 2009 10:14 AM)  Orders Added: 1)  Vit B12 1000 mcg [J3420]

## 2010-10-22 NOTE — Assessment & Plan Note (Signed)
Summary: MONTHLY B12 SHOT...LSW.  Nurse Visit   Allergies: No Known Drug Allergies  Medication Administration  Injection # 1:    Medication: Vit B12 1000 mcg    Diagnosis: B12 DEFICIENCY (ICD-266.2)    Route: IM    Site: L deltoid    Exp Date: 04/13    Lot #: [id number]    Mfr: [employer]    Patient tolerated injection without complications    Given by: Ashok Cordia RN (February 25, 2010 8:39 AM)  Orders Added: 1)  Vit B12 1000 mcg [J3420]

## 2010-10-22 NOTE — Assessment & Plan Note (Signed)
Summary: MONTHLY B12 INJ..266.2/SP  Nurse Visit   Allergies: No Known Drug Allergies  Medication Administration  Injection # 1:    Medication: Vit B12 1000 mcg    Diagnosis: B12 DEFICIENCY (ICD-266.2)    Route: IM    Site: L deltoid    Exp Date: 8/12    Lot #: [id number]    Mfr: [employer]    Patient tolerated injection without complications    Given by: Milford Cage NCMA (November 22, 2009 8:38 AM)  Orders Added: 1)  Vit B12 1000 mcg [J3420]

## 2010-10-22 NOTE — Assessment & Plan Note (Signed)
Summary: SEVERE LOWER BACK PAIN/RUPTURED DISK/CJR   Vital Signs:  Patient profile:   45 year old male Temp:     97.9 degrees F oral BP sitting:   130 / 88  (left arm) Cuff size:   large  Vitals Entered By: Sid Falcon LPN (November 29, 3555 12:02 PM) CC: severe low back pain, left side X 1 week   History of Present Illness: Acute visit for low back pain.  Started one week ago. No recurrent injury. Pain is sharp at times and severe in intensity at times. Radiates down toward the knee but not below. No weakness or numbness. No loss of bladder or bowel control. Worse with any movement.  Pain improved with Flexeril and Lorcet but recently ran out. History of back surgery approximately 2 years ago with right-sided herniated disc. Current symptoms somewhat similar but left side.  Allergies (verified): No Known Drug Allergies  Past History:  Past Medical History: Last updated: 08/24/2007 GERD Hyperlipidemia Low back pain  Past Surgical History: Last updated: 08/31/2009 Ll2 back surgery trauma to stomach/ Exploration Laparotomy and Small Bowel resection Had ventral hernia repairs Left leg surgery PMH reviewed for relevance  Review of Systems  The patient denies anorexia, fever, weight loss, hematuria, incontinence, and muscle weakness.    Physical Exam  General:  Well-developed,well-nourished,in no acute distress; alert,appropriate and cooperative throughout examination Heart:  Normal rate and regular rhythm. S1 and S2 normal without gallop, murmur, click, rub or other extra sounds. Msk:  Limited back flexion or extension secondary to pain Extremities:  straight leg raise is negative. No edema Neurologic:  patient has symmetric 2+ reflexes knee and ankle bilaterally. Possibly minimal weakness with dorsiflexion left compared with right. Full strength plantar flexion. No sensory impairment.   Impression & Recommendations:  Problem # 1:  LOW BACK PAIN (ICD-724.2) refill  medications below His updated medication list for this problem includes:    Flexeril 10 Mg Tabs (Cyclobenzaprine hcl) .Marland Kitchen... Take one tab every 8 hours as needed for muscle spasm    Lorcet 10/650 10-650 Mg Tabs (Hydrocodone-acetaminophen) .Marland Kitchen... Take one tab every 6 hours as needed for pain  Complete Medication List: 1)  Zegerid 40-1100 Mg Caps (Omeprazole-sodium bicarbonate) .... One by mouth two times a day failed prilosec and protonix 2)  Niacin 500 Mg Tabs (Niacin) .... Take 1 tablet by mouth once daily 3)  Fish Oil 500 Mg Caps (Omega-3 fatty acids) .... Take 1 capsule by mouth once daily 4)  Flexeril 10 Mg Tabs (Cyclobenzaprine hcl) .... Take one tab every 8 hours as needed for muscle spasm 5)  Lorcet 10/650 10-650 Mg Tabs (Hydrocodone-acetaminophen) .... Take one tab every 6 hours as needed for pain 6)  Benefiber Pack (Guar gum) .... One tbsp into cereal once daily  Patient Instructions: 1)  Followup with Dr. Lovell Sheehan in 2 weeks if pain not improving. Avoid any heavy lifting or back flexion in the meantime 2)  Most patients (90%) with low back pain will improve with time ( 2-6 weeks). Keep active but avoid activities that are painful. Apply moist heat and/or ice to lower back several times a day.  Prescriptions: LORCET 10/650 10-650 MG TABS (HYDROCODONE-ACETAMINOPHEN) Take as needed  #40 x 0   Entered and Authorized by:   Evelena Peat MD   Signed by:   Evelena Peat MD on 11/28/2009   Method used:   Print then Give to Patient   RxID:   [id number] FLEXERIL 10 MG TABS (CYCLOBENZAPRINE HCL)  Take as needed  #30 x 0   Entered and Authorized by:   Evelena Peat MD   Signed by:   Evelena Peat MD on 11/28/2009   Method used:   Print then Give to Patient   RxID:   [id number]

## 2010-10-24 NOTE — Assessment & Plan Note (Signed)
Summary: monthly b12 injection/dns  Nurse Visit   Allergies: No Known Drug Allergies  Medication Administration  Injection # 1:    Medication: Vit B12 1000 mcg    Diagnosis: B12 DEFICIENCY (ICD-266.2)    Route: IM    Site: L deltoid    Exp Date: 06/2012    Lot #: [id number]    Mfr: American Regent    Patient tolerated injection without complications    Given by: Harlow Mares CMA (AAMA) (October 14, 2010 9:50 AM)  Orders Added: 1)  Vit B12 1000 mcg [J3420]

## 2010-10-24 NOTE — Assessment & Plan Note (Signed)
Summary: cough//ccm   Vital Signs:  Patient profile:   45 year old male Temp:     98.2 degrees F BP sitting:   150 / 98  (left arm) Cuff size:   regular  Vitals Entered By: Sid Falcon LPN (September 18, 2010 2:05 PM)  History of Present Illness: Persistent cough for 2-3 weeks.  Mostly dry cough. Some PND symptoms.  Tried Delsym, Tussend without relief. No fever.  No dyspnea.  Also tried Allegra D.  Nonsmoker. Overall feels well.  No wheezing.  On Zegerid for GERD. GERD well controlled.  Allergies (verified): No Known Drug Allergies  Past History:  Past Medical History: Last updated: 08/24/2007 GERD Hyperlipidemia Low back pain  Social History: Last updated: 08/31/2009 Occupation: Personnel officer Married Never Smoked Alcohol use-yes-occsaion Daily Caffeine Use-1 drink daily Illicit Drug Use - no PMH reviewed for relevance, SH/Risk Factors reviewed for relevance  Review of Systems  The patient denies anorexia, fever, weight loss, hoarseness, chest pain, prolonged cough, and hemoptysis.    Physical Exam  General:  Well-developed,well-nourished,in no acute distress; alert,appropriate and cooperative throughout examination Ears:  External ear exam shows no significant lesions or deformities.  Otoscopic examination reveals clear canals, tympanic membranes are intact bilaterally without bulging, retraction, inflammation or discharge. Hearing is grossly normal bilaterally. Mouth:  Oral mucosa and oropharynx without lesions or exudates.  Teeth in good repair. Neck:  No deformities, masses, or tenderness noted. Lungs:  Normal respiratory effort, chest expands symmetrically. Lungs are clear to auscultation, no crackles or wheezes. Heart:  Normal rate and regular rhythm. S1 and S2 normal without gallop, murmur, click, rub or other extra sounds.   Impression & Recommendations:  Problem # 1:  COUGH (ICD-786.2) suspect viral bronchitis.  Cough supressant given.  Complete  Medication List: 1)  Zegerid 40-1100 Mg Caps (Omeprazole-sodium bicarbonate) .... One by mouth two times a day failed prilosec and protonix 2)  Niacin 500 Mg Tabs (Niacin) .... Take 1 tablet by mouth once daily 3)  Fish Oil 500 Mg Caps (Omega-3 fatty acids) .... Take 1 capsule by mouth once daily 4)  Flexeril 10 Mg Tabs (Cyclobenzaprine hcl) .... Take one tab every 8 hours as needed for muscle spasm 5)  Tussionex Pennkinetic Er 10-8 Mg/31ml Lqcr (Hydrocod polst-chlorphen polst) .... One tsp by mouth q 12 hours as needed cough 6)  Benefiber Pack (Guar gum) .... One tbsp into cereal once daily  Patient Instructions: 1)  Acute Bronchitis symptoms for less then 10 days are not  helped by antibiotics. Take over the counter cough medications. Call if no improvement in 5-7 days, sooner if increasing cough, fever, or new symptoms ( shortness of breath, chest pain) .  Prescriptions: TUSSIONEX PENNKINETIC ER 10-8 MG/5ML LQCR (HYDROCOD POLST-CHLORPHEN POLST) one tsp by mouth q 12 hours as needed cough  #120 ml x 0   Entered and Authorized by:   Evelena Peat MD   Signed by:   Evelena Peat MD on 09/18/2010   Method used:   Print then Give to Patient   RxID:   [id number]    Orders Added: 1)  Est. Patient Level III [14782]

## 2010-10-24 NOTE — Op Note (Signed)
Summary: Dr. Vilma Meckel ventral hernia repair with mesh.                         [facility]  Patient:    Bradley [name], Bradley [name]                            MRN: [id number] Proc. Date: 05/22/00 Adm. Date:  [id number] Attending:  Abigail Miyamoto A                           Operative Report  PREOPERATIVE DIAGNOSIS:  Ventral incisional hernia.  POSTOPERATIVE DIAGNOSIS:  Ventral incisional hernia.  PROCEDURE:  Laparoscopic ventral hernia repair with mesh.  SURGEON:  Abigail Miyamoto, M.D.  ASSISTANT:  Anselm Pancoast. Zachery Dakins, M.D.  ANESTHESIA:  General endotracheal and 1/4% Marcaine plain.  ESTIMATED BLOOD LOSS:  Minimal.  DESCRIPTION OF PROCEDURE:  The patient was brought to the operating room and identified as Bradley [name].  He was placed supine on the operating table, and general anesthesia was induced.  His abdomen was then prepped and draped in the usual sterile fashion.  Using a #15 blade, a small vertical incision was made just above the pubis.  The incision was carried down through the fascia. The peritoneum was then identified and opened.  The Hasson port was then placed into the abdominal cavity, and insufflation of the abdomen was begun. The camera was then inserted, and the patient was found to have a large amount of adhesions to his midline.  A 5 mm port was then placed in the patients left flank.  Blunt dissection was then used along with the electrocautery to take down multiple adhesions to the patients abdominal wall.  Once all adhesions were completely removed, another 5 mm port was placed in the patients right flank.  The 5 mm port in the left flank was changed to a 10 mm port for purposes of manipulating the camera.  The hernia defect in the upper midline was then identified and found to be approximately 9 x 7 cm in size.  A piece of 50 x 19 Gore-Tex DualMesh was brought onto the field.  Four separate 0 Novofil sutures were placed in the four corners of the  mesh.  The mesh was then inserted through the camera port.  Next, the mesh was unfolded in the abdominal cavity.  The suture passer was then used at the 12 oclock, 3 oclock, 6 oclock, and 9 oclock positions, pulling the Novofil up through the perineum and fascia and out of the skin.  All sutures were then tied in place, pulling the mesh up to the abdominal wall.  Next, the suture packer was brought onto the field, and the mesh was tacked down circumferentially to the abdominal wall.  Excellent coverage of the hernia defect appeared to be achieved.  Hemostasis also appeared to be achieved.  At this point, all ports were removed under direct vision, and the abdomen was deflated.  The mesh appeared to lay appropriately as the abdomen was deflated.  Next, all incisions were anesthetized with 1/4% Marcaine.  The fascia at the port at lower midline was then closed with several 0 Vicryl sutures.  All skin incisions were then closed with 4-0 Monocryl subcuticular stitches, and the skin incisions were then covered with Steri-Strips, gauze, and Tegaderm.  The patient tolerated the procedure well.  All sponge, needle and instrument  counts were correct at the end of the procedure.  The patient was then extubated in the operating room and taken in stable condition to the recovery room. DD:  05/22/00 TD:  05/24/00 Job: [id number]

## 2010-10-24 NOTE — Medication Information (Signed)
Summary: Prior Authorization Request for Zegerid  Prior Authorization Request for Zegerid   Imported By: Maryln Gottron 09/17/2010 10:26:58  _____________________________________________________________________  External Attachment:    Type:   Image     Comment:   External Document

## 2010-10-24 NOTE — Medication Information (Signed)
Summary: Approval for Zegerid  Approval for Zegerid   Imported By: Maryln Gottron 09/24/2010 10:25:01  _____________________________________________________________________  External Attachment:    Type:   Image     Comment:   External Document

## 2010-10-24 NOTE — Assessment & Plan Note (Signed)
Summary: MONTHLY B12//266.2//SP  Nurse Visit   Allergies: No Known Drug Allergies  Medication Administration  Injection # 1:    Medication: Vit B12 1000 mcg    Diagnosis: B12 DEFICIENCY (ICD-266.2)    Route: IM    Site: L deltoid    Exp Date: 10/13    Lot #: [id number]    Mfr: American Regent    Patient tolerated injection without complications    Given by: Lamona Curl CMA Duncan Dull) (September 11, 2010 8:37 AM)  Orders Added: 1)  Vit B12 1000 mcg [J3420]  I have asked patient to set up first available return office visit before leaving today (his last visit with Dr Jarold Motto was 08/31/09). Patient verbalizes understanding and will have b12 injection completed before the office visit if the next available is over 1 months time. Dottie Nelson-Smith CMA Duncan Dull)  September 11, 2010 8:37 AM

## 2010-10-24 NOTE — Assessment & Plan Note (Signed)
Summary: 1 YR FU/JMS   History of Present Illness Visit Type: Follow-up Visit Primary GI MD: Sheryn Bison MD FACP FAGA Primary Provider: Darryll Capers, MD Chief Complaint: Patient here for routine follow up GERD. He states that he is doing well without any current complaints. History of Present Illness:   Bradley [name] is a 45 year old Caucasian male with chronic acid reflux and a prominent hiatal hernia. He currently is asymptomatic on Zegerid 40 mg a day but does have some nocturnal breakthrough symptoms if he overeats. He denies dysphagia or any hepatobiliary complaints. He has had abnormal liver function tests with negative hepatitis C. antibody and negative hepatitis B surface antigen. He denies any hepatobiliary complaints.  Patient also had removal of adenomatous colon polyps at the time of colonoscopy a year ago. He had abdominal trauma 10 years ago and had a bowel resection by Dr. Rayburn Ma, and we will request those records. He denies abdominal pain, back irregularity, melena or hematochezia. He recently has had a pulmonary infection and was treated with antibiotics and decongestants. He does suffer from hyperlipidemia.Previous Endoscopy Had Shown a Large Hiatal Hernia, peptic stricture was dilated, but no evidence of Barrett's mucosa.   GI Review of Systems    Reports acid reflux and  chest pain.      Denies abdominal pain, belching, bloating, dysphagia with liquids, dysphagia with solids, heartburn, loss of appetite, nausea, vomiting, vomiting blood, weight loss, and  weight gain.        Denies anal fissure, black tarry stools, change in bowel habit, constipation, diarrhea, diverticulosis, fecal incontinence, heme positive stool, hemorrhoids, irritable bowel syndrome, jaundice, light color stool, liver problems, rectal bleeding, and  rectal pain.    Current Medications (verified): 1)  Zegerid 40-1100 Mg Caps (Omeprazole-Sodium Bicarbonate) .... One By Mouth Two Times A Day Failed Prilosec  and Protonix 2)  Niacin 500 Mg Tabs (Niacin) .... Take 1 Tablet By Mouth Once Daily 3)  Fish Oil 500 Mg Caps (Omega-3 Fatty Acids) .... Take 1 Capsule By Mouth Once Daily 4)  Flexeril 10 Mg Tabs (Cyclobenzaprine Hcl) .... Take One Tab Every 8 Hours As Needed For Muscle Spasm 5)  Tussionex Pennkinetic Er 10-8 Mg/24ml Lqcr (Hydrocod Polst-Chlorphen Polst) .... One Tsp By Mouth Q 12 Hours As Needed Cough 6)  Benefiber  Pack (Guar Gum) .... One Tbsp Into Cereal Once Daily  Allergies (verified): No Known Drug Allergies  Past History:  Past medical, surgical, family and social histories (including risk factors) reviewed for relevance to current acute and chronic problems.  Past Medical History: Reviewed history from 08/24/2007 and no changes required. GERD Hyperlipidemia Low back pain  Past Surgical History: Ll2 back surgery trauma to stomach/ Exploration Laparotomy and Small Bowel resection Ventral hernia repairs Left leg surgery  Family History: Reviewed history from 08/31/2009 and no changes required. Family History High cholesterol Family History Hypertension No FH of Colon Cancer:  Social History: Reviewed history from 08/31/2009 and no changes required. Occupation: Personnel officer Married Never Smoked Alcohol use-yes-occsaion Daily Caffeine Use-1 drink daily Illicit Drug Use - no  Review of Systems       The patient complains of back pain.  The patient denies allergy/sinus, anemia, anxiety-new, arthritis/joint pain, blood in urine, breast changes/lumps, change in vision, confusion, cough, coughing up blood, depression-new, fainting, fatigue, fever, headaches-new, hearing problems, heart murmur, heart rhythm changes, itching, menstrual pain, muscle pains/cramps, night sweats, nosebleeds, pregnancy symptoms, shortness of breath, skin rash, sleeping problems, sore throat, swelling of feet/legs, swollen lymph glands, thirst -  excessive , urination - excessive , urination  changes/pain, urine leakage, vision changes, and voice change.    Vital Signs:  Patient profile:   45 year old male Height:      71 inches Weight:      193.19 pounds BMI:     27.04 BSA:     2.08 Pulse rate:   80 / minute Pulse rhythm:   regular BP sitting:   124 / 84  (left arm)  Vitals Entered By: Lamona Curl CMA Duncan Dull) (October 03, 2010 8:56 AM)  Physical Exam  General:  Well developed, well nourished, no acute distress.healthy appearing.   Head:  Normocephalic and atraumatic. Eyes:  PERRLA, no icterus.exam deferred to patient's ophthalmologist.   Neck:  Supple; no masses or thyromegaly. Lungs:  Clear throughout to auscultation. Heart:  Regular rate and rhythm; no murmurs, rubs,  or bruits. Abdomen:  Soft, nontender and nondistended. No masses, hepatosplenomegaly or hernias noted. Normal bowel sounds.Prominent midline scar from upper abdomen to below the umbilicus. There is no evidence of an incisional hernia, organomegaly, masses or tenderness. Extremities:  No clubbing, cyanosis, edema or deformities noted. Neurologic:  Alert and  oriented x4;  grossly normal neurologically. Psych:  Alert and cooperative. Normal mood and affect.   Impression & Recommendations:  Problem # 1:  B12 DEFICIENCY (ICD-266.2) Assessment Improved Continue parenteral replacement therapy an anemia profile and CBC ordered today.  Problem # 2:  TRANSAMINASES, SERUM, ELEVATED (ICD-790.4) Assessment: Unchanged repeat liver enzymes ordered. If these are still elevated we'll proceed with further hepatic workup and upper abdominal ultrasound exam. Orders: TLB-CBC Platelet - w/Differential (85025-CBCD) TLB-BMP (Basic Metabolic Panel-BMET) (80048-METABOL) TLB-Hepatic/Liver Function Pnl (80076-HEPATIC) TLB-TSH (Thyroid Stimulating Hormone) (84443-TSH) TLB-B12, Serum-Total ONLY (45409-W11) TLB-Ferritin (82728-FER) TLB-IBC Pnl (Iron/FE;Transferrin) (83550-IBC) TLB-Folic Acid (Folate)  (82746-FOL)  Problem # 3:  GERD (ICD-530.81) Assessment: Improved Continue antireflux regime with the Zegerid 40 mg q.a.m. and p.r.n. Pepcid AC at bedtime. Orders: TLB-CBC Platelet - w/Differential (85025-CBCD) TLB-BMP (Basic Metabolic Panel-BMET) (80048-METABOL) TLB-Hepatic/Liver Function Pnl (80076-HEPATIC) TLB-TSH (Thyroid Stimulating Hormone) (84443-TSH) TLB-B12, Serum-Total ONLY (91478-G95) TLB-Ferritin (82728-FER) TLB-IBC Pnl (Iron/FE;Transferrin) (83550-IBC) TLB-Folic Acid (Folate) (82746-FOL)  Problem # 4:  PERSONAL HX COLONIC POLYPS (ICD-V12.72) Assessment: Unchanged Colonoscopy followup as per clinical protocol.  Patient Instructions: 1)  Copy sent to : Darryll Capers, MD 2)  Please go to the basement today for your labs.  3)  Please schedule a follow-up appointment in 1 year. 4)  Your prescription(s) have been sent to you pharmacy.  5)  The medication list was reviewed and reconciled.  All changed / newly prescribed medications were explained.  A complete medication list was provided to the patient / caregiver. 6)  Avoid foods high in acid content ( tomatoes, citrus juices, spicy foods) . Avoid eating within 3 to 4 hours of lying down or before exercising. Do not over eat; try smaller more frequent meals. Elevate head of bed four inches when sleeping.

## 2010-10-24 NOTE — Op Note (Signed)
Summary: Laparoscopic converted to open ventral hernia repair   NAME:  Bradley [name], Bradley [name]                             ACCOUNT NO.:  [id number]   MEDICAL RECORD NO.:  [id number]                   PATIENT TYPE:  OBV   LOCATION:  0452                                 [facility]ACILITY[facility]  [facility]   PHYSICIAN:  Abigail Miyamoto, M.D.              DATE OF BIRTH:  [birth year]   DATE OF PROCEDURE:  10/17/2003  DATE OF DISCHARGE:                                 OPERATIVE REPORT   PREOPERATIVE DIAGNOSIS:  Ventral incisional hernia.   POSTOPERATIVE DIAGNOSIS:  Ventral incisional hernia.   OPERATION/PROCEDURE:  Laparoscopic converted to open ventral hernia repair  with mesh.   SURGEON:  Abigail Miyamoto, M.D.   ASSISTANT:  Barney Drain, M.D.   ANESTHESIA:  General endotracheal anesthesia.   ESTIMATED BLOOD LOSS:  Minimal.   FINDINGS:  The patient was found to have multiple adhesions of the small  bowel to the abdominal wall necessitating conversion to an open procedure.   DESCRIPTION OF PROCEDURE:  The patient was brought to the operating room  identified as [name].  He was placed upon the operating table and general  anesthesia was induced.  His abdomen was then prepped and draped in the  usual sterile fashion.  Using a #15 blade, a small transverse incision was  made in the patient's left leg through a previous scar.  Incision was  carried down through the fascia which was opened with electrocautery.  Peritoneum was then opened with a scalpel.  The hemostat was able to be  passed into the peritoneal cavity.  A 0 Vicryl pursestring suture was then  placed around the fascia opening.  The Hasson port was placed through the  opening and insufflation of the abdomen was begun.  After the camera was  inserted in the abdominal cavity, a small fascial defect was identified and  small bowel was found to be stuck to the abdominal wall circumferentially  around this.  The patient's transverse colon was also stuck  to the abdominal  wall and the patient had multiple adhesions.  A 5 mm port was placed in the  patient's left lower quadrant under direct vision.  The small bowel was  manipulated slightly but was still stuck to the abdominal wall.  Decision  was made to terminate the laparoscopic approach and convert to an open  procedure.   At this point both ports were removed.  The midline scar around the  umbilicus was opened with a #10 scalpel.  Incision was carried down to the  hernia sac which was easily opened.  The fascia was identified and opened  further with the electrocautery as well as the Metzenbaum scissors.  Small  bowel was stuck to the abdominal wall.  Circumferentially was taken down  with Metzenbaum scissors, freeing up the fascia in its entirety.  The small  bowel was examined and no injury  was identified.  At this point the  overlying fascia was identified circumferentially, undermining the skin with  the electrocautery.  The midline fascia was then closed with a running #1  PDS suture.  A piece of 7.5 x 15 cm of mesh was then brought onto the field.  It was placed in on-lay fashion over the fascia and then sewn in place to  the fascia with interrupted 0 Novofil pop-off sutures.  Excellent coverage  of the fascia  appeared to be achieved.  A separate skin incision was then  made and a 19-French Blake drain was placed into the incision.  The  subcutaneous tissue layer was then closed with interrupted 3-0 Vicryl  sutures.  The skin was then closed with skin staples.  The drain was sewn in  place with a 3-0 nylon suture.  Another 0 Novofil suture was also placed in  the fascia at the lateral port site.  The port site incision was likewise  closed with staples.  The patient tolerated the procedure well.  All sponge,  needle and instrument counts were correct at the end of the procedure.  The  patient was then extubated in the operating room and taken in stable  condition to the  recovery room.                                               Abigail Miyamoto, M.D.    DB/MEDQ  D:  10/16/2003  T:  10/16/2003  Job:  [id number]

## 2010-10-24 NOTE — Miscellaneous (Signed)
Summary: Orders Update  Clinical Lists Changes  Orders: Added new Referral order of MRI Abdomen (MRI Abdomen) - Signed

## 2010-10-24 NOTE — Progress Notes (Signed)
Summary: cough  Phone Note Call from Patient   Caller: Patient Call For: Stacie Glaze MD Reason for Call: Acute Illness Summary of Call: Pt has had post nasal drainage and cough x one month, and needs RX.  Not sick.  Need cough RX.  Sleeps sitting up. [facility] [place]) [phone] Initial call taken by: Lynann Beaver CMA AAMA,  September 11, 2010 9:50 AM  Follow-up for Phone Call        use saline nasal spray, delsym and allegra d  Follow-up by: Willy Eddy, LPN,  September 11, 2010 10:00 AM

## 2010-11-04 ENCOUNTER — Encounter: Payer: Self-pay | Admitting: Gastroenterology

## 2010-11-04 ENCOUNTER — Encounter (INDEPENDENT_AMBULATORY_CARE_PROVIDER_SITE_OTHER): Payer: 59

## 2010-11-04 DIAGNOSIS — E538 Deficiency of other specified B group vitamins: Secondary | ICD-10-CM

## 2010-11-13 NOTE — Assessment & Plan Note (Signed)
Summary: MONTHLY B12 SHOT  Nurse Visit   Allergies: No Known Drug Allergies  Medication Administration  Injection # 1:    Medication: Vit B12 1000 mcg    Diagnosis: B12 DEFICIENCY (ICD-266.2)    Route: IM    Site: L deltoid    Exp Date: 07/2012    Lot #: [id number]    Mfr: American Regent    Patient tolerated injection without complications    Given by: Christie Nottingham CMA (AAMA) (November 04, 2010 9:07 AM)  Orders Added: 1)  Vit B12 1000 mcg [J3420]

## 2010-11-29 ENCOUNTER — Encounter: Payer: Self-pay | Admitting: Family Medicine

## 2010-11-29 ENCOUNTER — Ambulatory Visit (INDEPENDENT_AMBULATORY_CARE_PROVIDER_SITE_OTHER): Payer: 59 | Admitting: Family Medicine

## 2010-11-29 VITALS — BP 140/90 | Temp 98.3°F | Ht 70.75 in | Wt 199.0 lb

## 2010-11-29 DIAGNOSIS — M79606 Pain in leg, unspecified: Secondary | ICD-10-CM

## 2010-11-29 DIAGNOSIS — M79609 Pain in unspecified limb: Secondary | ICD-10-CM

## 2010-11-29 DIAGNOSIS — S8410XA Injury of peroneal nerve at lower leg level, unspecified leg, initial encounter: Secondary | ICD-10-CM

## 2010-11-29 NOTE — Progress Notes (Signed)
  Subjective:    Patient ID: Bradley [name], male    DOB: 1966-02-19, 45 y.o.   MRN: [id number]  HPI  Patient seen with left leg pain. Poorly localized. Describes achy pain from the knee down toward the foot. Prior history of peroneal nerve injury with surgery 2 years ago. Had some improvement recently. Feels that his foot drop is starting to progress again. He has weakness with dorsi flexion. Pain is relatively mild. Achy quality. Denies any burning. No recent injury.  No significant low back pain.   Review of Systems     Objective:   Physical Exam  patient is alert and healthy in appearance. Chest clear to auscultation Heart regular rhythm and rate Extremities no edema. Left knee full range of motion. Scar from previous surgery-lateral knee. No edema, erythema , or ecchymosis. No rash.  Neuro patient has some weakness with dorsiflexion on the left compared to the right.  reflexes are 2+ knee and ankle. Normal sensory function feet and legs.       Assessment & Plan:   recurrent left leg pain and weakness with dorsiflexion with prior history of peroneal nerve injury. Referral back to neurosurgeon for further evaluation

## 2010-12-01 ENCOUNTER — Encounter: Payer: Self-pay | Admitting: Family Medicine

## 2010-12-04 ENCOUNTER — Encounter: Payer: Self-pay | Admitting: Gastroenterology

## 2010-12-04 ENCOUNTER — Encounter (INDEPENDENT_AMBULATORY_CARE_PROVIDER_SITE_OTHER): Payer: 59

## 2010-12-04 DIAGNOSIS — E538 Deficiency of other specified B group vitamins: Secondary | ICD-10-CM

## 2010-12-10 NOTE — Assessment & Plan Note (Signed)
Summary: MONTHLY B 12 SHOT  Nurse Visit   Allergies: No Known Drug Allergies  Medication Administration  Injection # 1:    Medication: Vit B12 1000 mcg    Diagnosis: B12 DEFICIENCY (ICD-266.2)    Route: IM    Site: L deltoid    Exp Date: 07/2012    Lot #: [id number]    Mfr: American Regent    Patient tolerated injection without complications    Given by: Milford Cage NCMA (December 04, 2010 9:04 AM)  Orders Added: 1)  Vit B12 1000 mcg [J3420]

## 2011-01-01 ENCOUNTER — Encounter (HOSPITAL_COMMUNITY)
Admission: RE | Admit: 2011-01-01 | Discharge: 2011-01-01 | Disposition: A | Discharge status: Home / Self Care | Payer: 59 | Source: Ambulatory Visit | Attending: Neurosurgery | Admitting: Neurosurgery

## 2011-01-01 LAB — URINALYSIS, ROUTINE W REFLEX MICROSCOPIC
Glucose, UA: NEGATIVE mg/dL
Hgb urine dipstick: NEGATIVE
Ketones, ur: NEGATIVE mg/dL
Protein, ur: NEGATIVE mg/dL
pH: 7 (ref 5.0–8.0)

## 2011-01-01 LAB — BASIC METABOLIC PANEL
CO2: 30 mEq/L (ref 19–32)
Calcium: 9.1 mg/dL (ref 8.4–10.5)
Chloride: 106 mEq/L (ref 96–112)
GFR calc Af Amer: >60 mL/min (ref >60)
Glucose, Bld: 100 mg/dL — ABNORMAL HIGH (ref 70–99)
Potassium: 4.3 mEq/L (ref 3.5–5.1)

## 2011-01-01 LAB — CBC
HCT: 43.8 % (ref 39.0–52.0)
Hemoglobin: 15.5 g/dL (ref 13.0–17.0)
MCHC: 35.4 g/dL (ref 30.0–36.0)
Platelets: 200 10*3/uL (ref 150–400)

## 2011-01-01 LAB — PROTIME-INR: INR: 0.93 (ref 0.00–1.49)

## 2011-01-01 LAB — SURGICAL PCR SCREEN
MRSA, PCR: NEGATIVE
Staphylococcus aureus: POSITIVE — AB

## 2011-01-07 ENCOUNTER — Observation Stay (HOSPITAL_COMMUNITY)
Admission: RE | Admit: 2011-01-07 | Discharge: 2011-01-07 | Disposition: A | Discharge status: Home / Self Care | Payer: 59 | Source: Ambulatory Visit | Attending: Neurosurgery | Admitting: Neurosurgery

## 2011-01-07 ENCOUNTER — Ambulatory Visit (HOSPITAL_COMMUNITY): Payer: 59

## 2011-01-07 DIAGNOSIS — K219 Gastro-esophageal reflux disease without esophagitis: Secondary | ICD-10-CM | POA: Insufficient documentation

## 2011-01-07 DIAGNOSIS — M129 Arthropathy, unspecified: Secondary | ICD-10-CM | POA: Insufficient documentation

## 2011-01-07 DIAGNOSIS — Z01812 Encounter for preprocedural laboratory examination: Secondary | ICD-10-CM | POA: Insufficient documentation

## 2011-01-07 DIAGNOSIS — M5126 Other intervertebral disc displacement, lumbar region: Principal | ICD-10-CM | POA: Insufficient documentation

## 2011-01-07 DIAGNOSIS — M47817 Spondylosis without myelopathy or radiculopathy, lumbosacral region: Secondary | ICD-10-CM | POA: Insufficient documentation

## 2011-01-10 NOTE — Op Note (Signed)
[name], [name] NO.:  [id number]  MEDICAL RECORD NO.:  [id number]           PATIENT TYPE:  O  LOCATION:  3528                         FACILITY:  [facility]  PHYSICIAN:  Clydene Fake, M.D.  DATE OF BIRTH:  [birth year]  DATE OF PROCEDURE:  01/07/2011 DATE OF DISCHARGE:  01/07/2011                              OPERATIVE REPORT   PREOPERATIVE DIAGNOSIS:  Lumbar stenosis spondylosis, herniated nucleus pulposus with radiculopathy, left L4-5.  POSTOPERATIVE DIAGNOSIS:  Lumbar stenosis spondylosis, herniated nucleus pulposus with radiculopathy, left L4-5.  PROCEDURE:  Left decompressive laminectomy, decompressive of the L4-L5 roots (two levels) good, diskectomy, microdissection with microscope.  SURGEON:  Clydene Fake, MD  ASSISTANT:  Stefani Dama, MD  ANESTHESIA:  General endotracheal tube anesthesia.  ESTIMATED BLOOD LOSS:  Minimal.  BLOOD GIVEN:  None.  COMPLICATIONS:  None.  REASON FOR THE PROCEDURE:  The patient is a 45 year old gentleman who has been complaining of left leg weakness..  He has a history of left peroneal nerve decompression for his weakness in his leg that improved somewhat, but not all the way which was a few years ago, but these have progressive problems.  He has been through MRI lumbar, spine x-rays, EMG nerve conduction velocity of the lower extremities which shows lumbar radiculopathy, no sign of peroneal neuropathy with some multilevel spondylotic changes worse at L4-5 level, spondylitic change causing lateral recess stenosis and central to left-sided disk protrusion.  All this causes left to lateral recess stenosis.  The patient was brought in for decompression of the left L4-5 area.  PROCEDURE IN DETAIL:  The patient was brought to the operating and general anesthesia was induced.  The patient was placed in a prone position on Wilson frame with all pressure points padded.  The patient was prepped and draped in sterile  fashion and site of incision was injected with 18 mL of 1% lidocaine with epinephrine.  Needle was placed in this interspace.  X-rays were obtained showing the needle was pointed at the L5-1 level.  This was done prior to doing the injection of the local.  Incision was made higher than where the needle was placed. Incision was taken down to fascia.  Hemostasis was obtained with Bovie cauterization.  The Bovie was used for a subperiosteal dissection was spinous process and lamina up to the facet.  Self-retaining retractor was placed and was thought to be the L4-5 level and a marker was placed at the interspace and x-rays were obtained and this confirmed our positioning at the L4-5 level.  Microscope was brought in for microdissection.  A high-speed drill was used to start decompressive semi hemilaminectomy removing bottom part of the L4 lamina, medial facets and the top part of the L5 lamina.  Hypertrophic ligament was removed along with the hypertrophic medial facets decompressing the central canal.  Ensure we had good decompression of the L4-5 roots and bilateral foraminotomies were done.  We then explored the epidural space and then the disk space and found a broad-based subligamentous protrusion, boggy disk pushing up into the underside of L5 root and  thecal sac.  The disk space was incised with a 15-blade and diskectomy done with pituitary rongeurs and curettes.  When we were finished, we had good decompression of the central canal in L5-5 roots.  We got hemostasis with bipolar cauterization and Gelfoam thrombin. Gelfoam was irrigated out with antibiotic solution.  We had very good hemostasis. We had good decompression of the nerve roots.  The retractors were removed and fascia was closed with 0 Vicryl interrupted sutures. Subcutaneous tissue was closed with 2-0 and 3-0 Vicryl interrupted sutures.  Skin closed with benzoin and Steri-Strips.  Dressing was placed.  The patient was  placed back in the supine position, awoken from anesthesia and transferred to recovery room in stable condition.          ______________________________ Clydene Fake, M.D.     JRH/MEDQ  D:  01/07/2011  T:  01/07/2011  Job:  [id number]  Electronically Signed by Colon Branch M.D. on 01/10/2011 04:44:46 PM

## 2011-01-13 ENCOUNTER — Ambulatory Visit (INDEPENDENT_AMBULATORY_CARE_PROVIDER_SITE_OTHER): Payer: 59 | Admitting: Gastroenterology

## 2011-01-13 DIAGNOSIS — E538 Deficiency of other specified B group vitamins: Secondary | ICD-10-CM

## 2011-01-13 MED ORDER — CYANOCOBALAMIN 1000 MCG/ML IJ SOLN: 1000.0000 ug | Freq: Once | INTRAMUSCULAR | Status: DC

## 2011-01-13 MED ORDER — CYANOCOBALAMIN 1000 MCG/ML IJ SOLN
1000.0000 ug | INTRAMUSCULAR | Status: DC
  Administered 2011-01-13 – 2011-05-19 (×5): 1000 ug via INTRAMUSCULAR

## 2011-02-04 NOTE — Op Note (Signed)
NAMEELTON, HEID NO.:  [id number]   MEDICAL RECORD NO.:  [id number]          PATIENT TYPE:  AMB   LOCATION:  SDS                          FACILITY:  [facility]   PHYSICIAN:  Payton Doughty, M.D.      DATE OF BIRTH:  11-12-1965   DATE OF PROCEDURE:  03/21/2008  DATE OF DISCHARGE:                               OPERATIVE REPORT   PREOPERATIVE DIAGNOSIS:  Left peroneal nerve compression.   POSTOPERATIVE DIAGNOSIS:  Left peroneal nerve compression.   PROCEDURE:  Left peroneal nerve decompression.   SURGEON:  Payton Doughty, MD, Neurosurgery   ANESTHESIA:  General endotracheal.   PREPARATION:  Prepped and draped with alcohol wipe.   COMPLICATIONS:  None.   ASSISTANT:  [place].   BODY OF TEXT:  A 45 year old gentleman with an EMG diagnosed with left  peroneal neuropathy of his knee, taken to the operating room, had an LMA  placed.  Following shave, prepped and draped in usual sterile fashion,  skin and subcutaneous with 1% lidocaine with 1:100,000 epinephrine and  the skin was incised starting 3 fingerbreadths superomedial to the  fibular head carried down to a fingerbreadth below the fibular head and  then down paralleling the peroneus longus muscle.  The fascial plane was  obtained.  The nerve identified the fascia, over top of it divided.  The  nerve was followed down into the anterior compartment of leg.  The  peroneus longus muscle edge was identified with the compressing force  and divided.  The nerve was then followed down into leg without  difficulty, was free.  The wound was irrigated and hemostasis assured,  successive layer using 3-0 Vicryl and 3-0 nylon were used to close.  Betadine, Telfa dressing was applied and made occlusive with OpSite.  The patient returned to recovery room in good condition.           ______________________________  Payton Doughty, M.D.     MWR/MEDQ  D:  03/21/2008  T:  03/21/2008  Job:  [id number]

## 2011-02-04 NOTE — H&P (Signed)
NAMENEWT, LEVINGSTON NO.:  [id number]   MEDICAL RECORD NO.:  [id number]          PATIENT TYPE:  AMB   LOCATION:  SDS                          FACILITY:  [facility]   PHYSICIAN:  Payton Doughty, M.D.      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   A 45 year old right-handed white gentleman.  He has had 3-4 disk done.  He has also had a 2-3 disk done.  He has developed a foot drop on the  left side.  An EMG shows peroneal neuropathy on the left.  He is  admitted for peroneal nerve decompression.   MEDICAL HISTORY:  None.   MEDICATIONS:  1. Flexeril.  2. Hydrocodone.  3. Protonix.   SURGICAL HISTORY:  Exploration of his abdomen, following his fistula  injury in 2000.   ALLERGIES:  None.   SOCIAL HISTORY:  Does not smoke, drinks on social basis, and is a  Teaching laboratory technician.   FAMILY HISTORY:  Mom is 87 and dad is 51; both in good health.   REVIEW OF SYSTEMS:  Marked for back pain and leg pain.   PHYSICAL EXAMINATION:  HEENT:  Normal limits.  NECK:  Good range of motion.  CHEST:  Clear.  CARDIAC:  Regular rate and rhythm.  ABDOMEN:  Nontender.  No hepatosplenomegaly.  EXTREMITIES:  No clubbing or cyanosis.  Peripheral pulses are good.  GU:  Deferred.  NEUROLOGIC:  He is awake, alert, and oriented.  Cranial nerves are  intact.  Motor exam shows 5/5 strength throughout the upper and lower  extremities except with the dorsiflexion left foot is 4/5.  His  sensation is diminished in the left peroneal distribution.   EMG is positive for peroneal neuropathy of knee.   CLINICAL IMPRESSION:  Left peroneal neuropathy.   PLAN:  Left peroneal nerve decompression.  The risks and benefits have  been discussed with him, and he wishes proceed.           ______________________________  Payton Doughty, M.D.     MWR/MEDQ   D:  03/21/2008  T:  03/21/2008  Job:  [id number]

## 2011-02-07 NOTE — H&P (Signed)
[facility]  Patient:    Bradley [name], Bradley [name]                            MRN: [id number] Adm. Date:  [id number] Attending:  Abigail Miyamoto A                         History and Physical  CHIEF COMPLAINT:  Abdominal pain.  HISTORY OF PRESENT ILLNESS:  The patient is an 45 year old man who has, for the past several weeks, had increasing lower abdominal discomfort.  On the day of admission, he had severe cramping.  He has not vomited, but has felt quite nauseated.  He had had decreased stool and flatus.  Abdominal x-rays were consistent with small bowel obstruction.  CT scan was confirmatory.  This morning, his x-rays looked worse.  His white count had risen from 10.5 to 13.5.  He is admitted for care.  There is no history of chronic GI problems. He has had an appendectomy.  He has had radiation therapy for carcinoma of the prostate.  His PSAs have been running low.  PAST MEDICAL HISTORY:  He is generally healthy.  He takes Advil for pain.  He takes Timoptic for glaucoma.  He uses Ultram also for pain.  He is on Capoten or high blood pressure.  He has no known drug allergies.  The patient drinks about three alcoholic beverages daily.  He does not smoke.  He denies heart disease, lung disease or other serious chronic problems.  He has had transurethral prostate surgery.  No other abdominal operations except as above.  PHYSICAL EXAMINATION:  VITAL SIGNS:  Temperature and vital signs per nursing report.  GENERAL:  Mental status is normal.  HEENT:  Unremarkable.  NECK:  Unremarkable.  CHEST:  Clear to auscultation.  HEART:  Rate and rhythm normal.  No murmur or gallop.  ABDOMEN:  Distended.  Diffusely tender with slight rebound tenderness.  Very active bowel sounds with tinkles and rushes.  RECTAL:  Somewhat patulous sphincter.  Otherwise unremarkable.  The prostate is reduced in size.  EXTREMITIES:  Normal.  NEUROLOGIC:  Normal.  LYMPH NODES:  None  enlarged.  SKIN:  No lesions noted.  IMPRESSION: 1. Small bowel obstruction, possibly due to stricture, possibly due to    adhesions, possibly due to tumor. 2. Hypertension, stable. 3. History of prostate cancer.  PLAN:  Admission and exploratory laparotomy. DD:  05/23/00 TD:  05/25/00 Job: [id number]
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
## 2011-02-07 NOTE — Op Note (Signed)
NAMENESTOR, WIENEKE NO.:  [id number]   MEDICAL RECORD NO.:  [id number]                   PATIENT TYPE:  OBV   LOCATION:  0452                                 FACILITY:  [facility]   PHYSICIAN:  Abigail Miyamoto, M.D.              DATE OF BIRTH:  [birth year]   DATE OF PROCEDURE:  10/17/2003  DATE OF DISCHARGE:                                 OPERATIVE REPORT   PREOPERATIVE DIAGNOSIS:  Ventral incisional hernia.   POSTOPERATIVE DIAGNOSIS:  Ventral incisional hernia.   OPERATION/PROCEDURE:  Laparoscopic converted to open ventral hernia repair  with mesh.   SURGEON:  Abigail Miyamoto, M.D.   ASSISTANT:  Barney Drain, M.D.   ANESTHESIA:  General endotracheal anesthesia.   ESTIMATED BLOOD LOSS:  Minimal.   FINDINGS:  The patient was found to have multiple adhesions of the small  bowel to the abdominal wall necessitating conversion to an open procedure.   DESCRIPTION OF PROCEDURE:  The patient was brought to the operating room  identified as Bradley [name].  He was placed upon the operating table and general  anesthesia was induced.  His abdomen was then prepped and draped in the  usual sterile fashion.  Using a #15 blade, a small transverse incision was  made in the patient's left leg through a previous scar.  Incision was  carried down through the fascia which was opened with electrocautery.  Peritoneum was then opened with a scalpel.  The hemostat was able to be  passed into the peritoneal cavity.  A 0 Vicryl pursestring suture was then  placed around the fascia opening.  The Hasson port was placed through the  opening and insufflation of the abdomen was begun.  After the camera was  inserted in the abdominal cavity, a small fascial defect was identified and  small bowel was found to be stuck to the abdominal wall circumferentially  around this.  The patient's transverse colon was also stuck to the abdominal  wall and the patient had multiple adhesions.   A 5 mm port was placed in the  patient's left lower quadrant under direct vision.  The small bowel was  manipulated slightly but was still stuck to the abdominal wall.  Decision  was made to terminate the laparoscopic approach and convert to an open  procedure.   At this point both ports were removed.  The midline scar around the  umbilicus was opened with a #10 scalpel.  Incision was carried down to the  hernia sac which was easily opened.  The fascia was identified and opened  further with the electrocautery as well as the Metzenbaum scissors.  Small  bowel was stuck to the abdominal wall.  Circumferentially was taken down  with Metzenbaum scissors, freeing up the fascia in its entirety.  The small  bowel was examined and no injury was identified.  At this point the  overlying fascia  was identified circumferentially, undermining the skin with  the electrocautery.  The midline fascia was then closed with a running #1  PDS suture.  A piece of 7.5 x 15 cm of mesh was then brought onto the field.  It was placed in on-lay fashion over the fascia and then sewn in place to  the fascia with interrupted 0 Novofil pop-off sutures.  Excellent coverage  of the fascia  appeared to be achieved.  A separate skin incision was then  made and a 19-French Blake drain was placed into the incision.  The  subcutaneous tissue layer was then closed with interrupted 3-0 Vicryl  sutures.  The skin was then closed with skin staples.  The drain was sewn in  place with a 3-0 nylon suture.  Another 0 Novofil suture was also placed in  the fascia at the lateral port site.  The port site incision was likewise  closed with staples.  The patient tolerated the procedure well.  All sponge,  needle and instrument counts were correct at the end of the procedure.  The  patient was then extubated in the operating room and taken in stable  condition to the recovery room.                                               Abigail Miyamoto, M.D.    DB/MEDQ  D:  10/16/2003  T:  10/16/2003  Job:  [id number]

## 2011-02-07 NOTE — Op Note (Signed)
NAMERODDIE, RIEGLER NO.:  [id number]   MEDICAL RECORD NO.:  [id number]          PATIENT TYPE:  AMB   LOCATION:  SDS                          FACILITY:  [facility]   PHYSICIAN:  Payton Doughty, M.D.      DATE OF BIRTH:  11-20-1965   DATE OF PROCEDURE:  11/03/2006  DATE OF DISCHARGE:                               OPERATIVE REPORT   PREOPERATIVE DIAGNOSIS:  Spondylosis L2-3.   POSTOPERATIVE DIAGNOSIS:  Spondylosis L2-3.   PROCEDURES:  L2-3 laminotomy, foraminotomy.   SURGEON:  Payton Doughty, M.D.   ANESTHESIA:  General endotracheal.   PREP:  Sterile Betadine prep and scrub with alcohol wipe.   COMPLICATIONS:  None.   NURSE ASSISTANT:  [place].   DOCTOR ASSISTANT:  Hilda Lias, M.D.   BODY OF TEXT:  45 year old gentleman with severe lumbar spondylosis at 2-  3, taken to operating room, smoothly anesthetized, intubated, placed  prone on the operating table.  Following shave, prepped and draped in  the usual sterile fashion.  The skin was infiltrated with __________  epinephrine.  Skin was incised from mid L1 to the top of L3 and the  lamina of L2 was exposed in the subperiosteal plane.  Intraoperative x-  ray confirmed correctness of level.  Having confirmed correctness level,  laminotomy and foraminotomies carried out with high-speed drill thinning  the bone up to the top of ligamentum flavum that was then removed in  retrograde fashion laterally undercutting the pars although sparing a  substantial portion of the bone.  Following complete decompression, the  L2 and L3 neural foramina both were both explored and found to be open.  There was a small amount of 2-3 disk but not enough to warrant opening  the disk space.  Wound was irrigated.  Hemostasis assured.  Depo-Medrol  soaked fat was used to fill the laminotomy defects.  Successive layers  of 0-0 Vicryl, 2-0 Vicryl, 3-0 nylon were used to close.  Betadine Telfa  dressing was applied.  The patient returned  recovery room in good  condition.           ______________________________  Payton Doughty, M.D.     MWR/MEDQ  D:  11/03/2006  T:  11/04/2006  Job:  [id number]

## 2011-02-07 NOTE — Op Note (Signed)
[facility]  Patient:    Bradley [name], Bradley [name]                            MRN: [id number] Proc. Date: 05/22/00 Adm. Date:  [id number] Attending:  Abigail Miyamoto A                           Operative Report  PREOPERATIVE DIAGNOSIS:  Ventral incisional hernia.  POSTOPERATIVE DIAGNOSIS:  Ventral incisional hernia.  PROCEDURE:  Laparoscopic ventral hernia repair with mesh.  SURGEON:  Abigail Miyamoto, M.D.  ASSISTANT:  Anselm Pancoast. Zachery Dakins, M.D.  ANESTHESIA:  General endotracheal and 1/4% Marcaine plain.  ESTIMATED BLOOD LOSS:  Minimal.  DESCRIPTION OF PROCEDURE:  The patient was brought to the operating room and identified as Bradley [name].  He was placed supine on the operating table, and general anesthesia was induced.  His abdomen was then prepped and draped in the usual sterile fashion.  Using a #15 blade, a small vertical incision was made just above the pubis.  The incision was carried down through the fascia. The peritoneum was then identified and opened.  The Hasson port was then placed into the abdominal cavity, and insufflation of the abdomen was begun. The camera was then inserted, and the patient was found to have a large amount of adhesions to his midline.  A 5 mm port was then placed in the patients left flank.  Blunt dissection was then used along with the electrocautery to take down multiple adhesions to the patients abdominal wall.  Once all adhesions were completely removed, another 5 mm port was placed in the patients right flank.  The 5 mm port in the left flank was changed to a 10 mm port for purposes of manipulating the camera.  The hernia defect in the upper midline was then identified and found to be approximately 9 x 7 cm in size.  A piece of 50 x 19 Gore-Tex DualMesh was brought onto the field.  Four separate 0 Novofil sutures were placed in the four corners of the mesh.  The mesh was then inserted through the camera port.  Next, the mesh  was unfolded in the abdominal cavity.  The suture passer was then used at the 12 oclock, 3 oclock, 6 oclock, and 9 oclock positions, pulling the Novofil up through the perineum and fascia and out of the skin.  All sutures were then tied in place, pulling the mesh up to the abdominal wall.  Next, the suture packer was brought onto the field, and the mesh was tacked down circumferentially to the abdominal wall.  Excellent coverage of the hernia defect appeared to be achieved.  Hemostasis also appeared to be achieved.  At this point, all ports were removed under direct vision, and the abdomen was deflated.  The mesh appeared to lay appropriately as the abdomen was deflated.  Next, all incisions were anesthetized with 1/4% Marcaine.  The fascia at the port at lower midline was then closed with several 0 Vicryl sutures.  All skin incisions were then closed with 4-0 Monocryl subcuticular stitches, and the skin incisions were then covered with Steri-Strips, gauze, and Tegaderm.  The patient tolerated the procedure well.  All sponge, needle and instrument counts were correct at the end of the procedure.  The patient was then extubated in the operating room and taken in stable condition to the recovery room. DD:  05/22/00 TD:  05/24/00 Job: [id number]
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
## 2011-02-12 ENCOUNTER — Ambulatory Visit (INDEPENDENT_AMBULATORY_CARE_PROVIDER_SITE_OTHER): Payer: 59 | Admitting: Gastroenterology

## 2011-02-12 DIAGNOSIS — E538 Deficiency of other specified B group vitamins: Secondary | ICD-10-CM

## 2011-03-17 ENCOUNTER — Ambulatory Visit (INDEPENDENT_AMBULATORY_CARE_PROVIDER_SITE_OTHER): Payer: 59 | Admitting: Gastroenterology

## 2011-03-17 DIAGNOSIS — E538 Deficiency of other specified B group vitamins: Secondary | ICD-10-CM

## 2011-03-18 NOTE — Progress Notes (Signed)
b12 given

## 2011-04-17 ENCOUNTER — Ambulatory Visit (INDEPENDENT_AMBULATORY_CARE_PROVIDER_SITE_OTHER): Payer: 59 | Admitting: Gastroenterology

## 2011-04-17 DIAGNOSIS — E538 Deficiency of other specified B group vitamins: Secondary | ICD-10-CM

## 2011-04-18 ENCOUNTER — Encounter (HOSPITAL_COMMUNITY): Payer: Self-pay | Admitting: Radiology

## 2011-04-18 ENCOUNTER — Emergency Department (HOSPITAL_COMMUNITY): Payer: Worker's Compensation

## 2011-04-18 ENCOUNTER — Emergency Department (HOSPITAL_COMMUNITY)
Admission: EM | Admit: 2011-04-18 | Discharge: 2011-04-18 | Disposition: A | Discharge status: Home / Self Care | Payer: Worker's Compensation | Attending: General Surgery | Admitting: General Surgery

## 2011-04-18 DIAGNOSIS — M546 Pain in thoracic spine: Secondary | ICD-10-CM | POA: Insufficient documentation

## 2011-04-18 DIAGNOSIS — M545 Low back pain, unspecified: Secondary | ICD-10-CM

## 2011-04-18 DIAGNOSIS — IMO0002 Reserved for concepts with insufficient information to code with codable children: Secondary | ICD-10-CM | POA: Insufficient documentation

## 2011-04-18 DIAGNOSIS — R109 Unspecified abdominal pain: Secondary | ICD-10-CM | POA: Insufficient documentation

## 2011-04-18 DIAGNOSIS — W230XXA Caught, crushed, jammed, or pinched between moving objects, initial encounter: Secondary | ICD-10-CM | POA: Insufficient documentation

## 2011-04-18 DIAGNOSIS — S3981XA Other specified injuries of abdomen, initial encounter: Secondary | ICD-10-CM | POA: Insufficient documentation

## 2011-04-18 LAB — CBC
Hemoglobin: 14.7 g/dL (ref 13.0–17.0)
MCH: 30.4 pg (ref 26.0–34.0)
MCHC: 35.3 g/dL (ref 30.0–36.0)
MCV: 86 fL (ref 78.0–100.0)
Platelets: 270 10*3/uL (ref 150–400)
RBC: 4.84 MIL/uL (ref 4.22–5.81)

## 2011-04-18 LAB — COMPREHENSIVE METABOLIC PANEL
ALT: 49 U/L (ref 0–53)
Alkaline Phosphatase: 78 U/L (ref 39–117)
BUN: 22 mg/dL (ref 6–23)
CO2: 27 mEq/L (ref 19–32)
GFR calc Af Amer: >60 mL/min (ref >60)
GFR calc non Af Amer: >60 mL/min (ref >60)
Glucose, Bld: 127 mg/dL — ABNORMAL HIGH (ref 70–99)
Potassium: 3.2 mEq/L — ABNORMAL LOW (ref 3.5–5.1)
Sodium: 145 mEq/L (ref 135–145)

## 2011-04-18 LAB — POCT I-STAT, CHEM 8
BUN: 23 mg/dL (ref 6–23)
Creatinine, Ser: 1.2 mg/dL (ref 0.50–1.35)
Glucose, Bld: 128 mg/dL — ABNORMAL HIGH (ref 70–99)
Hemoglobin: 15 g/dL (ref 13.0–17.0)
Sodium: 145 meq/L (ref 135–145)
TCO2: 25 mmol/L (ref 0–100)

## 2011-04-18 LAB — TYPE AND SCREEN
ABO/RH(D): A NEG
Antibody Screen: NEGATIVE
Unit division: 0

## 2011-04-18 LAB — PROTIME-INR: Prothrombin Time: 13.2 seconds (ref 11.6–15.2)

## 2011-04-18 LAB — ABO/RH: ABO/RH(D): A NEG

## 2011-04-18 MED ORDER — IOHEXOL 300 MG/ML  SOLN
100.0000 mL | Freq: Once | INTRAMUSCULAR | Status: AC | PRN
  Administered 2011-04-18: 100 mL via INTRAVENOUS

## 2011-04-23 ENCOUNTER — Encounter: Payer: Self-pay | Admitting: Family Medicine
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
## 2011-05-19 ENCOUNTER — Ambulatory Visit (INDEPENDENT_AMBULATORY_CARE_PROVIDER_SITE_OTHER): Payer: 59 | Admitting: Gastroenterology

## 2011-05-19 DIAGNOSIS — E538 Deficiency of other specified B group vitamins: Secondary | ICD-10-CM

## 2011-06-16 ENCOUNTER — Ambulatory Visit (INDEPENDENT_AMBULATORY_CARE_PROVIDER_SITE_OTHER): Payer: 59 | Admitting: Family Medicine

## 2011-06-16 DIAGNOSIS — R059 Cough, unspecified: Secondary | ICD-10-CM

## 2011-06-16 DIAGNOSIS — R05 Cough: Secondary | ICD-10-CM

## 2011-06-16 DIAGNOSIS — J309 Allergic rhinitis, unspecified: Secondary | ICD-10-CM

## 2011-06-16 MED ORDER — METHYLPREDNISOLONE ACETATE 80 MG/ML IJ SUSP
80.0000 mg | Freq: Once | INTRAMUSCULAR | Status: AC
  Administered 2011-06-16: 80 mg via INTRAMUSCULAR

## 2011-06-16 MED ORDER — AZELASTINE HCL 0.1 % NA SOLN: 1.0000 | Freq: Two times a day (BID) | NASAL | Status: DC

## 2011-06-16 NOTE — Progress Notes (Signed)
  Subjective:    Patient ID: Bradley [name], male    DOB: [birth year], 45 y.o.   MRN: [id number]  HPI Patient seen with cough for one week duration. He states he has this every spring and fall. Cough is dry. He has postnasal drip mostly clear. No facial pain or headache. No fever. Mucinex DM without relief. Denies dyspnea. No hemoptysis. Nonsmoker. Takes Zegerid for reflux and reflux symptoms well controlled.   Review of Systems  Constitutional: Negative for fever, chills and unexpected weight change.  HENT: Positive for postnasal drip. Negative for sore throat and voice change.   Respiratory: Positive for cough. Negative for shortness of breath.   Cardiovascular: Negative for chest pain.  Neurological: Negative for headaches.       Objective:   Physical Exam  Constitutional: He appears well-developed and well-nourished.  HENT:  Right Ear: External ear normal.  Left Ear: External ear normal.  Mouth/Throat: Oropharynx is clear and moist.  Neck: Neck supple. No thyromegaly present.  Cardiovascular: Normal rate and regular rhythm.   Pulmonary/Chest: Effort normal and breath sounds normal. No respiratory distress. He has no rales.       Few very faint expiratory wheezes. No rales. No retractions.  Lymphadenopathy:    He has no cervical adenopathy.          Assessment & Plan:  Cough probably related to postnasal drip symptoms. Depo-Medrol 80 mg given. Try over-the-counter antihistamine such as Allegra and add Astelin nasal if no relief with that

## 2011-06-16 NOTE — Patient Instructions (Signed)
Consider over the counter antihistamine such as Allegra or Zyrtec and if no relief with that consider Astelin.

## 2011-06-17 ENCOUNTER — Encounter: Payer: Self-pay | Admitting: Family Medicine

## 2011-06-19 LAB — COMPREHENSIVE METABOLIC PANEL
ALT: 98 — ABNORMAL HIGH
Albumin: 4.2
Alkaline Phosphatase: 87
BUN: 13
Calcium: 9.5
Glucose, Bld: 103 — ABNORMAL HIGH
Potassium: 4
Sodium: 142
Total Protein: 6.5

## 2011-06-19 LAB — URINALYSIS, ROUTINE W REFLEX MICROSCOPIC
Ketones, ur: NEGATIVE
Nitrite: NEGATIVE
Specific Gravity, Urine: 1.018
pH: 7

## 2011-06-19 LAB — DIFFERENTIAL
Basophils Relative: 0
Lymphs Abs: 1.4
Monocytes Absolute: 0.5
Monocytes Relative: 9
Neutro Abs: 4
Neutrophils Relative %: 64

## 2011-06-19 LAB — APTT: aPTT: 27

## 2011-06-19 LAB — CBC
MCHC: 35.6
Platelets: 218
RDW: 12.4

## 2011-06-19 LAB — PROTIME-INR: INR: 0.9

## 2011-06-20 ENCOUNTER — Ambulatory Visit (INDEPENDENT_AMBULATORY_CARE_PROVIDER_SITE_OTHER): Payer: 59 | Admitting: Gastroenterology

## 2011-06-20 DIAGNOSIS — E538 Deficiency of other specified B group vitamins: Secondary | ICD-10-CM

## 2011-07-14 ENCOUNTER — Encounter: Payer: Self-pay | Admitting: Internal Medicine

## 2011-07-14 ENCOUNTER — Ambulatory Visit (INDEPENDENT_AMBULATORY_CARE_PROVIDER_SITE_OTHER): Payer: 59 | Admitting: Internal Medicine

## 2011-07-14 VITALS — BP 120/74 | Temp 98.1°F | Wt 196.0 lb

## 2011-07-14 DIAGNOSIS — Z23 Encounter for immunization: Secondary | ICD-10-CM

## 2011-07-14 DIAGNOSIS — Z0000 Encounter for general adult medical examination without abnormal findings: Secondary | ICD-10-CM

## 2011-07-14 DIAGNOSIS — R197 Diarrhea, unspecified: Secondary | ICD-10-CM

## 2011-07-14 MED ORDER — CIPROFLOXACIN HCL 500 MG PO TABS: 500.0000 mg | ORAL_TABLET | Freq: Two times a day (BID) | ORAL | Status: AC

## 2011-07-14 NOTE — Patient Instructions (Signed)
The main concern with gastroenteritis is dehydration.  Drink  plenty of  fluids, and advance your diet  slowly to solids as you feel improved.  If you are unable to keep anything down or  Show  signs of dehydration such as dry, cracked lips, or  not urinating, please notify our office.  ALIGN  One daily  Cipro-  Twice daily if symptoms worsen

## 2011-07-14 NOTE — Progress Notes (Signed)
  Subjective:    Patient ID: Bradley [name], male    DOB: Dec 01, 1965, 45 y.o.   MRN: [id number]  HPI  45 year old patient who has returned from a trip to [place] and has been plagued with diarrhea. He initially had some nausea and vomiting but presently diarrhea has greatly improved and is occurring only 2 or 3 times per day he's had no further abdominal pain or nausea. He has been using Imodium with some benefit. Denies any fever;  he has returned from his business trip 2 days ago.   Review of Systems  Constitutional: Negative for fever, chills, appetite change and fatigue.  HENT: Negative for hearing loss, ear pain, congestion, sore throat, trouble swallowing, neck stiffness, dental problem, voice change and tinnitus.   Eyes: Negative for pain, discharge and visual disturbance.  Respiratory: Negative for cough, chest tightness, wheezing and stridor.   Cardiovascular: Negative for chest pain, palpitations and leg swelling.  Gastrointestinal: Positive for diarrhea. Negative for nausea, vomiting, abdominal pain, constipation, blood in stool and abdominal distention.  Genitourinary: Negative for urgency, hematuria, flank pain, discharge, difficulty urinating and genital sores.  Musculoskeletal: Negative for myalgias, back pain, joint swelling, arthralgias and gait problem.  Skin: Negative for rash.  Neurological: Negative for dizziness, syncope, speech difficulty, weakness, numbness and headaches.  Hematological: Negative for adenopathy. Does not bruise/bleed easily.  Psychiatric/Behavioral: Negative for behavioral problems and dysphoric mood. The patient is not nervous/anxious.        Objective:   Physical Exam  Constitutional: He is oriented to person, place, and time. He appears well-developed.  HENT:  Head: Normocephalic.  Right Ear: External ear normal.  Left Ear: External ear normal.  Eyes: Conjunctivae and EOM are normal.  Neck: Normal range of motion.  Cardiovascular: Normal  rate and normal heart sounds.   Pulmonary/Chest: Breath sounds normal.  Abdominal: Soft. Bowel sounds are normal. He exhibits no distension. There is no tenderness. There is no rebound and no guarding.  Musculoskeletal: Normal range of motion. He exhibits no edema and no tenderness.  Neurological: He is alert and oriented to person, place, and time.  Psychiatric: He has a normal mood and affect. His behavior is normal.          Assessment & Plan:   Traveler's diarrhea. Improved. Will treat with Align one daily for 7 days. Hopefully his illness has largely resolved. He was given a prescription for Cipro if symptoms relapse. Presently he is tolerated a light diet which he will slowly advance

## 2011-07-22 ENCOUNTER — Ambulatory Visit (INDEPENDENT_AMBULATORY_CARE_PROVIDER_SITE_OTHER): Payer: 59 | Admitting: Gastroenterology

## 2011-07-22 DIAGNOSIS — E538 Deficiency of other specified B group vitamins: Secondary | ICD-10-CM

## 2011-07-22 MED ORDER — CYANOCOBALAMIN 1000 MCG/ML IJ SOLN
1000.0000 ug | INTRAMUSCULAR | Status: DC
  Administered 2011-07-22 – 2011-09-26 (×3): 1000 ug via INTRAMUSCULAR

## 2011-08-26 ENCOUNTER — Ambulatory Visit (INDEPENDENT_AMBULATORY_CARE_PROVIDER_SITE_OTHER): Payer: 59 | Admitting: Gastroenterology

## 2011-08-26 DIAGNOSIS — E538 Deficiency of other specified B group vitamins: Secondary | ICD-10-CM

## 2011-08-31 ENCOUNTER — Other Ambulatory Visit: Payer: Self-pay | Admitting: Internal Medicine

## 2011-09-24 ENCOUNTER — Telehealth: Payer: Self-pay | Admitting: *Deleted

## 2011-09-24 ENCOUNTER — Encounter: Payer: Self-pay | Admitting: Gastroenterology

## 2011-09-24 DIAGNOSIS — K760 Fatty (change of) liver, not elsewhere classified: Secondary | ICD-10-CM

## 2011-09-24 NOTE — Telephone Encounter (Signed)
Message copied by Leonette Monarch on Wed Sep 24, 2011  9:03 AM ------      Message from: Harlow Mares D      Created: Wed Nov 27, 2010 10:02 AM       Needs one year follow up ultrasound see MRI from 10/21/2010...Marland Kitchenfatty liver and gall stones.

## 2011-09-24 NOTE — Telephone Encounter (Signed)
abd ultrasound scheduled for 10/03/2011 9am.  pt aware of information and to be NPO. We will call him when results are back. I have left voicemail with informations. Advised him to call back with questions.

## 2011-09-24 NOTE — Telephone Encounter (Signed)
ERROR

## 2011-09-26 ENCOUNTER — Ambulatory Visit (INDEPENDENT_AMBULATORY_CARE_PROVIDER_SITE_OTHER): Payer: 59 | Admitting: Gastroenterology

## 2011-09-26 DIAGNOSIS — E538 Deficiency of other specified B group vitamins: Secondary | ICD-10-CM

## 2011-09-29 ENCOUNTER — Ambulatory Visit (INDEPENDENT_AMBULATORY_CARE_PROVIDER_SITE_OTHER): Payer: 59 | Admitting: Internal Medicine

## 2011-09-29 ENCOUNTER — Encounter: Payer: Self-pay | Admitting: Internal Medicine

## 2011-09-29 VITALS — BP 150/90 | HR 68 | Temp 98.2°F | Resp 16 | Ht 70.0 in | Wt 200.0 lb

## 2011-09-29 DIAGNOSIS — M479 Spondylosis, unspecified: Secondary | ICD-10-CM

## 2011-09-29 DIAGNOSIS — S139XXA Sprain of joints and ligaments of unspecified parts of neck, initial encounter: Secondary | ICD-10-CM

## 2011-09-29 DIAGNOSIS — S134XXA Sprain of ligaments of cervical spine, initial encounter: Secondary | ICD-10-CM

## 2011-09-29 MED ORDER — CYCLOBENZAPRINE HCL 10 MG PO TABS: 10.0000 mg | ORAL_TABLET | Freq: Three times a day (TID) | ORAL | Status: AC | PRN

## 2011-09-29 MED ORDER — METHYLPREDNISOLONE 4 MG PO KIT: PACK | ORAL | Status: AC
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
## 2011-10-03 ENCOUNTER — Other Ambulatory Visit (HOSPITAL_COMMUNITY): Payer: 59

## 2011-10-20 ENCOUNTER — Other Ambulatory Visit (INDEPENDENT_AMBULATORY_CARE_PROVIDER_SITE_OTHER): Payer: 59

## 2011-10-20 DIAGNOSIS — Z0000 Encounter for general adult medical examination without abnormal findings: Secondary | ICD-10-CM

## 2011-10-20 LAB — BASIC METABOLIC PANEL
BUN: 17 mg/dL (ref 6–23)
CO2: 30 mEq/L (ref 19–32)
Calcium: 9.3 mg/dL (ref 8.4–10.5)
GFR: 86.06 mL/min (ref >60.00)
Glucose, Bld: 98 mg/dL (ref 70–99)
Sodium: 144 mEq/L (ref 135–145)

## 2011-10-20 LAB — POCT URINALYSIS DIPSTICK
Blood, UA: NEGATIVE
Nitrite, UA: NEGATIVE
Protein, UA: NEGATIVE
Spec Grav, UA: 1.02
Urobilinogen, UA: 0.2
pH, UA: 7

## 2011-10-20 LAB — LDL CHOLESTEROL, DIRECT: Direct LDL: 191.1 mg/dL

## 2011-10-20 LAB — CBC WITH DIFFERENTIAL/PLATELET
Basophils Absolute: 0 10*3/uL (ref 0.0–0.1)
Eosinophils Absolute: 0.1 10*3/uL (ref 0.0–0.7)
HCT: 45.3 % (ref 39.0–52.0)
Hemoglobin: 15.7 g/dL (ref 13.0–17.0)
Lymphs Abs: 1.5 10*3/uL (ref 0.7–4.0)
MCHC: 34.7 g/dL (ref 30.0–36.0)
Monocytes Absolute: 0.4 10*3/uL (ref 0.1–1.0)
Monocytes Relative: 8.1 % (ref 3.0–12.0)
Neutro Abs: 3 10*3/uL (ref 1.4–7.7)
Platelets: 216 10*3/uL (ref 150.0–400.0)
RDW: 12.7 % (ref 11.5–14.6)

## 2011-10-20 LAB — LIPID PANEL
Cholesterol: 270 mg/dL — ABNORMAL HIGH (ref 0–200)
HDL: 49 mg/dL (ref >39.00)
Total CHOL/HDL Ratio: 6
Triglycerides: 144 mg/dL (ref 0.0–149.0)
VLDL: 28.8 mg/dL (ref 0.0–40.0)

## 2011-10-20 LAB — HEPATIC FUNCTION PANEL
AST: 34 U/L (ref 0–37)
Albumin: 4.4 g/dL (ref 3.5–5.2)
Total Bilirubin: 0.6 mg/dL (ref 0.3–1.2)

## 2011-10-27 ENCOUNTER — Encounter: Payer: 59 | Admitting: Internal Medicine

## 2011-10-28 ENCOUNTER — Telehealth: Payer: Self-pay | Admitting: *Deleted

## 2011-10-28 ENCOUNTER — Ambulatory Visit (INDEPENDENT_AMBULATORY_CARE_PROVIDER_SITE_OTHER): Payer: 59 | Admitting: Internal Medicine

## 2011-10-28 ENCOUNTER — Encounter: Payer: Self-pay | Admitting: Internal Medicine

## 2011-10-28 DIAGNOSIS — R7401 Elevation of levels of liver transaminase levels: Secondary | ICD-10-CM

## 2011-10-28 DIAGNOSIS — Z0000 Encounter for general adult medical examination without abnormal findings: Secondary | ICD-10-CM

## 2011-10-28 DIAGNOSIS — E785 Hyperlipidemia, unspecified: Secondary | ICD-10-CM

## 2011-10-28 DIAGNOSIS — R7402 Elevation of levels of lactic acid dehydrogenase (LDH): Secondary | ICD-10-CM

## 2011-10-28 MED ORDER — BISOPROLOL-HYDROCHLOROTHIAZIDE 2.5-6.25 MG PO TABS: 1.0000 | ORAL_TABLET | Freq: Every day | ORAL | Status: DC

## 2011-10-28 MED ORDER — PITAVASTATIN CALCIUM 4 MG PO TABS: 1.0000 | ORAL_TABLET | ORAL | Status: DC

## 2011-10-28 NOTE — Progress Notes (Signed)
Subjective:    Patient ID: Bradley [name], male    DOB: Jan 31, 1966, 46 y.o.   MRN: [id number]  HPI This is a 46 year old white male who presents for complete physical examination.  He recently has experienced labile hypertension after gaining 15 pounds after having a orthopedic surgery and neurosurgery.  He measures his blood pressure at home and at [facility] as well as the office and has been ranging in the 140-150 systolic range he denies any chest pain shortness breath PND or orthopnea his risk factors also include increasing her cholesterol with the added weight his lipids have moved into a higher risk stratification   Review of Systems  Constitutional: Negative for fever and fatigue.  HENT: Negative for hearing loss, congestion, neck pain and postnasal drip.   Eyes: Negative for discharge, redness and visual disturbance.  Respiratory: Negative for cough, shortness of breath and wheezing.   Cardiovascular: Negative for leg swelling.  Gastrointestinal: Negative for abdominal pain, constipation and abdominal distention.  Genitourinary: Negative for urgency and frequency.  Musculoskeletal: Negative for joint swelling and arthralgias.  Skin: Negative for color change and rash.  Neurological: Negative for weakness and light-headedness.  Hematological: Negative for adenopathy.  Psychiatric/Behavioral: Negative for behavioral problems.   No past medical history on file.  History   Social History  . Marital Status: Married    Spouse Name: N/A    Number of Children: N/A  . Years of Education: N/A   Occupational History  . Not on file.   Social History Main Topics  . Smoking status: Never Smoker   . Smokeless tobacco: Never Used  . Alcohol Use: No  . Drug Use: No  . Sexually Active: Not on file   Other Topics Concern  . Not on file   Social History Narrative   ** Merged History Encounter **     No past surgical history on file.  No family history on file.  No Known  Allergies  Current Outpatient Prescriptions on File Prior to Visit  Medication Sig Dispense Refill  . ZEGERID 40-1100 MG per capsule take 1 capsule twice a day  60 capsule  5   Current Facility-Administered Medications on File Prior to Visit  Medication Dose Route Frequency Provider Last Rate Last Dose  . DISCONTD: cyanocobalamin ((VITAMIN B-12)) injection 1,000 mcg  1,000 mcg Intramuscular Q30 days Sheryn Bison, MD   1,000 mcg at 09/26/11 0842    BP 144/90  Pulse 80  Temp 98.3 F (36.8 C)  Resp 16  Ht 5\' 11"  (1.803 m)  Wt 199 lb (90.266 kg)  BMI 27.75 kg/m2       Objective:   Physical Exam  Nursing note and vitals reviewed. Constitutional: He is oriented to person, place, and time. He appears well-developed and well-nourished.  HENT:  Head: Normocephalic and atraumatic.  Eyes: Conjunctivae are normal. Pupils are equal, round, and reactive to light.  Neck: Normal range of motion. Neck supple.  Cardiovascular: Normal rate and regular rhythm.   Pulmonary/Chest: Effort normal and breath sounds normal.  Abdominal: Soft. Bowel sounds are normal.  Genitourinary: Rectum normal and prostate normal.  Musculoskeletal:       Marked surgical scars of the back previous surgeries  Neurological: He is alert and oriented to person, place, and time.  Skin: Skin is dry.          Assessment & Plan:   Patient presents for yearly preventative medicine examination.   all immunizations and health maintenance protocols were reviewed  with the patient and they are up to date with these protocols.   screening laboratory values were reviewed with the patient including screening of hyperlipidemia PSA renal function and hepatic function.   There medications past medical history social history problem list and allergies were reviewed in detail.   Goals were established with regard to weight loss exercise diet in compliance with medications We will begin little low for milligrams by mouth  Monday and Friday as pulse therapy for hyperlipidemia to make sure he tolerates these in light of elevated liver enzymes in the past from fatty infiltration.  Weight loss is recommended a goal of 15 pounds over the next 4 months.  For his elevated blood pressure we'll begin ZIAC  one half by mouth daily and monitor his blood pressure if he achieves a weight loss of 15 pounds we may consider discontinuing this medication as we think is labile hypertension is due to the weight gain

## 2011-10-28 NOTE — Patient Instructions (Signed)
The patient is instructed to continue all medications as prescribed. Schedule followup with check out clerk upon leaving the clinic  

## 2011-10-28 NOTE — Telephone Encounter (Signed)
error 

## 2011-10-30 ENCOUNTER — Ambulatory Visit (INDEPENDENT_AMBULATORY_CARE_PROVIDER_SITE_OTHER): Payer: 59 | Admitting: Gastroenterology

## 2011-10-30 DIAGNOSIS — D518 Other vitamin B12 deficiency anemias: Secondary | ICD-10-CM

## 2011-10-30 MED ORDER — CYANOCOBALAMIN 1000 MCG/ML IJ SOLN
1000.0000 ug | Freq: Once | INTRAMUSCULAR | Status: AC
  Administered 2011-10-30: 1000 ug via INTRAMUSCULAR

## 2011-11-07 ENCOUNTER — Other Ambulatory Visit: Payer: Self-pay | Admitting: Internal Medicine

## 2011-11-07 MED ORDER — PANTOPRAZOLE SODIUM 40 MG PO TBEC: 40.0000 mg | DELAYED_RELEASE_TABLET | Freq: Every day | ORAL | Status: DC

## 2011-11-07 NOTE — Telephone Encounter (Signed)
Notified pt. 

## 2011-11-07 NOTE — Telephone Encounter (Signed)
May try protonix 40 1 qd #30 with 11 refills per dr Lovell Sheehan

## 2011-11-07 NOTE — Telephone Encounter (Signed)
Pt ins no longer covers zegerid. Pt is requesting another PPI call into [facility] [place] [phone]

## 2011-11-24 ENCOUNTER — Encounter: Payer: Self-pay | Admitting: *Deleted

## 2011-11-28 ENCOUNTER — Encounter: Payer: Self-pay | Admitting: Gastroenterology

## 2011-11-28 ENCOUNTER — Other Ambulatory Visit (INDEPENDENT_AMBULATORY_CARE_PROVIDER_SITE_OTHER): Payer: 59

## 2011-11-28 ENCOUNTER — Ambulatory Visit (INDEPENDENT_AMBULATORY_CARE_PROVIDER_SITE_OTHER): Payer: 59 | Admitting: Gastroenterology

## 2011-11-28 VITALS — BP 130/70 | HR 75 | Ht 71.0 in | Wt 195.8 lb

## 2011-11-28 DIAGNOSIS — R7989 Other specified abnormal findings of blood chemistry: Secondary | ICD-10-CM

## 2011-11-28 DIAGNOSIS — K219 Gastro-esophageal reflux disease without esophagitis: Secondary | ICD-10-CM

## 2011-11-28 DIAGNOSIS — R945 Abnormal results of liver function studies: Secondary | ICD-10-CM

## 2011-11-28 LAB — IBC PANEL
Iron: 88 ug/dL (ref 42–165)
Transferrin: 328.2 mg/dL (ref 212.0–360.0)

## 2011-11-28 LAB — AFP TUMOR MARKER: AFP-Tumor Marker: 3.9 ng/mL (ref 0.0–8.0)

## 2011-11-28 LAB — FERRITIN: Ferritin: 115.4 ng/mL (ref 22.0–322.0)

## 2011-11-28 MED ORDER — DEXLANSOPRAZOLE 60 MG PO CPDR: 60.0000 mg | DELAYED_RELEASE_CAPSULE | Freq: Every day | ORAL | Status: DC

## 2011-11-28 NOTE — Patient Instructions (Signed)
You have been scheduled for an abdominal ultrasound at [facility] (1st floor of hospital) on 12-02-11 at 8:30am. Please arrive 15 minutes prior to your appointment for registration. Make certain not to have anything to eat or drink after midnight. Should you need to reschedule your appointment, please contact radiology at [phone].  Your physician has requested that you go to the basement for lab work before leaving today  We have sent the following medications to your pharmacy for you to pick up at your convenience: Dexilant and we have also given you some samples to try

## 2011-11-28 NOTE — Progress Notes (Signed)
This is a very pleasant 46 year old Caucasian male status post several abdominal surgeries resulting from traumatic injury to his abdomen. He has had partial bowel resection and also to ventral hernia repairs by Dr. Rayburn Ma. He currently denies abdominal pain but does have acid reflux symptoms poorly controlled with OTC Protonix. He previously had done well on Zegerid which was denied by his insurance company. He denies dysphagia, painful swallowing, anorexia or weight loss. He has had mildly elevated liver function tests and has what appears to be several hemangiomas in his liver. Hepatitis C antibody and hepatitis B surface antigens have been negative. There is no family history of liver disease. He denies right upper quadrant pain, mental status changes, itching, or any other hepatobiliary complaints. He follows a regular diet and denies specific food intolerances, anorexia or weight loss. He is up-to-date on his endoscopy and colonoscopy exams.  Current Medications, Allergies, Past Medical History, Past Surgical History, Family History and Social History were reviewed in [employer] record.  Pertinent Review of Systems Negative   Physical Exam: Healthy patient with blood pressure 130/70 and pulse 75 and regular. BMI 27.31. I cannot appreciate stigmata of chronic liver disease. Chest is clear he appeared to be in a regular rhythm without murmurs gallops or rubs. I cannot appreciate hepatosplenomegaly, abdominal masses or tenderness or ascites. Bowel sounds are normal. Peripheral extremities are unremarkable and mental status is normal. Rectal exam was deferred.    Assessment and Plan:  Acid reflux poorly controlled all over-the-counter Protonix. We will try to get approval from his insurance company for a stronger PPI, and I have given him samples of Dexilant 60 mg every morning and reviewed standard antireflux maneuvers. If he has any dysphasia he is to notify us, and he may  need repeat endoscopic exam and dilations. I have set up followup liver ultrasound to assess his liver lesions, also iron levels, ANA, ceruloplasmin, alpha-1 antitrypsin, alpha-fetoprotein level and celiac panel. Further workup will depend on results of these tests. Encounter Diagnoses  Name Primary?  . Abnormal LFTs Yes  . GERD (gastroesophageal reflux disease)

## 2011-12-01 LAB — GLIA (IGA/G) + TTG IGA: Tissue Transglutaminase Ab, IgA: 3.3 U/mL (ref <20)

## 2011-12-02 ENCOUNTER — Ambulatory Visit (HOSPITAL_COMMUNITY)
Admission: RE | Admit: 2011-12-02 | Discharge: 2011-12-02 | Disposition: A | Discharge status: Home / Self Care | Payer: 59 | Source: Ambulatory Visit | Attending: Gastroenterology | Admitting: Gastroenterology

## 2011-12-02 DIAGNOSIS — Q619 Cystic kidney disease, unspecified: Secondary | ICD-10-CM | POA: Insufficient documentation

## 2011-12-02 DIAGNOSIS — K760 Fatty (change of) liver, not elsewhere classified: Secondary | ICD-10-CM

## 2011-12-02 DIAGNOSIS — K7689 Other specified diseases of liver: Secondary | ICD-10-CM | POA: Insufficient documentation

## 2011-12-02 DIAGNOSIS — N289 Disorder of kidney and ureter, unspecified: Secondary | ICD-10-CM | POA: Insufficient documentation

## 2011-12-02 DIAGNOSIS — K8020 Calculus of gallbladder without cholecystitis without obstruction: Secondary | ICD-10-CM | POA: Insufficient documentation

## 2011-12-02 LAB — CERULOPLASMIN: Ceruloplasmin: 30 mg/dL (ref 20–60)

## 2011-12-11 ENCOUNTER — Telehealth: Payer: Self-pay | Admitting: Gastroenterology

## 2011-12-11 NOTE — Telephone Encounter (Signed)
Pt with hx of several abdominal surgeries r/t traumatic injury to abdomen including partial bowel resection and ventral hernia repair. Pt has been on B12 injections since 08/31/09. B12 level was 190 2 years ago and is now 471. Pt wants to know if he has to continue the injections? Please advise. Thanks.

## 2011-12-12 NOTE — Telephone Encounter (Signed)
yes

## 2011-12-12 NOTE — Telephone Encounter (Signed)
Notified pt he needs to continue it B12 injections. Pt stated understanding and schedule one for next week.

## 2012-01-01 ENCOUNTER — Ambulatory Visit (INDEPENDENT_AMBULATORY_CARE_PROVIDER_SITE_OTHER): Payer: 59 | Admitting: Gastroenterology

## 2012-01-01 DIAGNOSIS — E538 Deficiency of other specified B group vitamins: Secondary | ICD-10-CM

## 2012-01-01 MED ORDER — CYANOCOBALAMIN 1000 MCG/ML IJ SOLN
1000.0000 ug | Freq: Once | INTRAMUSCULAR | Status: AC
  Administered 2012-01-01: 1000 ug via INTRAMUSCULAR

## 2012-01-29 ENCOUNTER — Ambulatory Visit (INDEPENDENT_AMBULATORY_CARE_PROVIDER_SITE_OTHER): Payer: 59 | Admitting: Gastroenterology

## 2012-01-29 DIAGNOSIS — E538 Deficiency of other specified B group vitamins: Secondary | ICD-10-CM

## 2012-01-29 MED ORDER — CYANOCOBALAMIN 1000 MCG/ML IJ SOLN
1000.0000 ug | Freq: Once | INTRAMUSCULAR | Status: AC
  Administered 2012-01-29: 1000 ug via INTRAMUSCULAR

## 2012-02-04 ENCOUNTER — Ambulatory Visit (INDEPENDENT_AMBULATORY_CARE_PROVIDER_SITE_OTHER): Payer: 59 | Admitting: Family Medicine

## 2012-02-04 ENCOUNTER — Encounter: Payer: Self-pay | Admitting: Family Medicine

## 2012-02-04 VITALS — BP 140/90 | Temp 98.0°F | Wt 202.0 lb

## 2012-02-04 DIAGNOSIS — M5416 Radiculopathy, lumbar region: Secondary | ICD-10-CM

## 2012-02-04 DIAGNOSIS — IMO0002 Reserved for concepts with insufficient information to code with codable children: Secondary | ICD-10-CM

## 2012-02-04 MED ORDER — PREDNISONE 10 MG PO TABS: ORAL_TABLET | ORAL | Status: DC

## 2012-02-04 NOTE — Patient Instructions (Signed)
Follow up promptly for any progressive pain or weakness. 

## 2012-02-04 NOTE — Progress Notes (Signed)
  Subjective:    Patient ID: Bradley [name], male    DOB: 1966-06-18, 46 y.o.   MRN: [id number]  HPI  Low back pain with left radiculopathy symptoms. 2 prior back surgeries. Most recently last April. Current pain 2 weeks duration. Noted after mowing his lawn. Has some left lumbar radiculopathy pains radiating to the foot. Occasional tingling sensations foot. Pain 7/10 at worst severity. Worse with changing positions and first getting up and actually improved somewhat further movement. Taken Aleve and Flexeril with mild relief. No incontinence symptoms. Patient relates some chronic weakness with left dorsi flexion since previous surgery.   Review of Systems  Constitutional: Negative for fever, activity change and appetite change.  Respiratory: Negative for cough and shortness of breath.   Cardiovascular: Negative for chest pain and leg swelling.  Gastrointestinal: Negative for vomiting and abdominal pain.  Genitourinary: Negative for dysuria, hematuria and flank pain.  Musculoskeletal: Positive for back pain. Negative for joint swelling.  Neurological: Positive for weakness and numbness.       Objective:   Physical Exam  Constitutional: He appears well-developed and well-nourished.  Cardiovascular: Normal rate and regular rhythm.   Pulmonary/Chest: Effort normal and breath sounds normal. No respiratory distress. He has no wheezes. He has no rales.  Musculoskeletal: He exhibits no edema.       Straight leg raise is negative  Neurological:       Patient has slightly diminished left ankle reflex compared to right which he states is since surgery and also has weakness of dorsi flexion left and question of mild weakness with left knee extension compared to right.          Assessment & Plan:  Left lumbar pain with left radiculopathy symptoms. Prednisone taper starting at 60 mg daily. Continue Flexeril. Consider repeat MRI or referral back to neurosurgeon if not improving over the next couple  of weeks.

## 2012-02-25 ENCOUNTER — Other Ambulatory Visit: Payer: 59

## 2012-02-27 ENCOUNTER — Other Ambulatory Visit: Payer: Self-pay | Admitting: *Deleted

## 2012-02-27 DIAGNOSIS — Z0000 Encounter for general adult medical examination without abnormal findings: Secondary | ICD-10-CM

## 2012-03-01 ENCOUNTER — Ambulatory Visit (INDEPENDENT_AMBULATORY_CARE_PROVIDER_SITE_OTHER): Payer: 59 | Admitting: Gastroenterology

## 2012-03-01 ENCOUNTER — Other Ambulatory Visit (INDEPENDENT_AMBULATORY_CARE_PROVIDER_SITE_OTHER): Payer: 59

## 2012-03-01 DIAGNOSIS — Z0000 Encounter for general adult medical examination without abnormal findings: Secondary | ICD-10-CM

## 2012-03-01 DIAGNOSIS — E538 Deficiency of other specified B group vitamins: Secondary | ICD-10-CM

## 2012-03-01 LAB — CBC WITH DIFFERENTIAL/PLATELET
Basophils Relative: 0.3 % (ref 0.0–3.0)
Eosinophils Relative: 1.4 % (ref 0.0–5.0)
HCT: 45.6 % (ref 39.0–52.0)
Hemoglobin: 15.2 g/dL (ref 13.0–17.0)
Lymphs Abs: 1.3 10*3/uL (ref 0.7–4.0)
MCV: 91.6 fl (ref 78.0–100.0)
Monocytes Absolute: 0.4 10*3/uL (ref 0.1–1.0)
Monocytes Relative: 8.8 % (ref 3.0–12.0)
Neutro Abs: 2.8 10*3/uL (ref 1.4–7.7)
WBC: 4.5 10*3/uL (ref 4.5–10.5)

## 2012-03-01 LAB — BASIC METABOLIC PANEL
BUN: 14 mg/dL (ref 6–23)
Chloride: 105 mEq/L (ref 96–112)
Creatinine, Ser: 1 mg/dL (ref 0.4–1.5)
GFR: 89.03 mL/min (ref >60.00)
Potassium: 4.2 mEq/L (ref 3.5–5.1)

## 2012-03-01 LAB — URINALYSIS
Hgb urine dipstick: NEGATIVE
Nitrite: NEGATIVE
Total Protein, Urine: NEGATIVE
Urine Glucose: NEGATIVE
pH: 7 (ref 5.0–8.0)

## 2012-03-01 LAB — LIPID PANEL
Cholesterol: 183 mg/dL (ref 0–200)
HDL: 47.5 mg/dL (ref >39.00)
VLDL: 39.4 mg/dL (ref 0.0–40.0)

## 2012-03-01 LAB — HEPATIC FUNCTION PANEL
ALT: 48 U/L (ref 0–53)
Albumin: 4.1 g/dL (ref 3.5–5.2)
Total Protein: 6.8 g/dL (ref 6.0–8.3)

## 2012-03-01 LAB — TSH: TSH: 1.53 u[IU]/mL (ref 0.35–5.50)

## 2012-03-01 MED ORDER — CYANOCOBALAMIN 1000 MCG/ML IJ SOLN
1000.0000 ug | Freq: Once | INTRAMUSCULAR | Status: AC
  Administered 2012-03-01: 1000 ug via INTRAMUSCULAR

## 2012-03-03 ENCOUNTER — Encounter: Payer: Self-pay | Admitting: Internal Medicine

## 2012-03-03 ENCOUNTER — Ambulatory Visit (INDEPENDENT_AMBULATORY_CARE_PROVIDER_SITE_OTHER): Payer: 59 | Admitting: Internal Medicine

## 2012-03-03 VITALS — BP 140/90 | HR 76 | Temp 98.2°F | Resp 16 | Ht 71.0 in | Wt 201.0 lb

## 2012-03-03 DIAGNOSIS — M549 Dorsalgia, unspecified: Secondary | ICD-10-CM

## 2012-03-03 DIAGNOSIS — E785 Hyperlipidemia, unspecified: Secondary | ICD-10-CM

## 2012-03-03 DIAGNOSIS — M25519 Pain in unspecified shoulder: Secondary | ICD-10-CM

## 2012-03-03 MED ORDER — PITAVASTATIN CALCIUM 4 MG PO TABS: 1.0000 | ORAL_TABLET | ORAL | Status: DC

## 2012-03-03 MED ORDER — MELOXICAM 15 MG PO TABS: 15.0000 mg | ORAL_TABLET | Freq: Every day | ORAL | Status: DC

## 2012-03-03 NOTE — Patient Instructions (Signed)
The patient is instructed to continue all medications as prescribed. Schedule followup with check out clerk upon leaving the clinic  

## 2012-03-03 NOTE — Progress Notes (Signed)
Subjective:    Patient ID: Bradley [name], male    DOB: 07/04/1966, 46 y.o.   MRN: [id number]  HPI is a 46 year old male who presents for followup of gastroesophageal reflux and hyperlipidemia P. visit labs drawn to look at his liver and lipids which were reviewed with patient.  Patient has a chief complaint today of persistent pain in his left shoulder he says has been going on for years it is worse when he lies on the shoulder. He also has sharp pain when he lifts his arm above his shoulder to do work over his head.    It is more constant discomfort rather than intermittent positional discomfort.  He also has a history of low back pain he is not on any current pain medications or anti-inflammatory medications    Review of Systems  Constitutional: Negative for fever and fatigue.  HENT: Negative for hearing loss, congestion, neck pain and postnasal drip.   Eyes: Negative for discharge, redness and visual disturbance.  Respiratory: Negative for cough, shortness of breath and wheezing.   Cardiovascular: Negative for leg swelling.  Gastrointestinal: Negative for abdominal pain, constipation and abdominal distention.  Genitourinary: Negative for urgency and frequency.  Musculoskeletal: Negative for joint swelling and arthralgias.  Skin: Negative for color change and rash.  Neurological: Negative for weakness and light-headedness.  Hematological: Negative for adenopathy.  Psychiatric/Behavioral: Negative for behavioral problems.   Past Medical History  Diagnosis Date  . Cough variant asthma 10/12/2007  . Vitamin B 12 deficiency   . Liver lesion   . Personal history of colonic polyps 09/28/2009     ADENOMATOUS POLYP  . Irritable bowel syndrome   . Elevated LFTs   . Esophageal reflux   . Hyperlipidemia   . Schatzki's ring   . Hiatal hernia   . GERD (gastroesophageal reflux disease)     History   Social History  . Marital Status: Married    Spouse Name: N/A    Number of  Children: 2  . Years of Education: N/A   Occupational History  . ELECTRICIAN    Social History Main Topics  . Smoking status: Never Smoker   . Smokeless tobacco: Never Used  . Alcohol Use: Yes     occ.  . Drug Use: No  . Sexually Active: Not on file   Other Topics Concern  . Not on file   Social History Narrative   ** Merged History Encounter **     Past Surgical History  Procedure Date  . Lumbar laminectomy     x2  . Exploration laparotomy and small bowel resection   . Ventral hernia repair   . Leg surgery     left    Family History  Problem Relation Age of Onset  . Colon cancer Neg Hx   . Breast cancer Mother   . Thyroid cancer Mother     No Known Allergies  Current Outpatient Prescriptions on File Prior to Visit  Medication Sig Dispense Refill  . DEXILANT 60 MG capsule Take 60 mg by mouth daily. Per Dr Jarold Motto      . OVER THE COUNTER MEDICATION [facility] brand of the zantac plus--use prn      . Pitavastatin Calcium (LIVALO) 4 MG TABS Take 1 tablet (4 mg total) by mouth 2 (two) times a week.  30 tablet    . DISCONTD: bisoprolol-hydrochlorothiazide (ZIAC) 2.5-6.25 MG per tablet Take 1 tablet by mouth daily.  30 tablet  11    BP 140/90  Pulse 76  Temp 98.2 F (36.8 C)  Resp 16  Ht 5\' 11"  (1.803 m)  Wt 201 lb (91.173 kg)  BMI 28.03 kg/m2       Objective:   Physical Exam  Nursing note and vitals reviewed. Constitutional: He appears well-developed and well-nourished.  HENT:  Head: Normocephalic and atraumatic.  Eyes: Conjunctivae are normal. Pupils are equal, round, and reactive to light.  Neck: Normal range of motion. Neck supple.  Cardiovascular: Normal rate and regular rhythm.   Pulmonary/Chest: Effort normal and breath sounds normal.  Abdominal: Soft. Bowel sounds are normal.  Musculoskeletal: He exhibits edema and tenderness.       Left  Shoulder with limited ROM          Assessment & Plan:  Persistent shoulder pain Refer to Dr.  supple to evaluate his shoulder Trial of Mobic as an anti-inflammatory for chronic pain the differential diagnosis includes a shoulder impingement syndrome persistent bursitis with spurring versus low cervical disc disease  Patient had blood work done prior to his office visit today including a liver panel and a renal panel which were both completely normal his cholesterol is remarkably improved with a total cholesterol 183 HDL 47 and an LDL of 96 meeting her goals for him

## 2012-04-02 ENCOUNTER — Ambulatory Visit (INDEPENDENT_AMBULATORY_CARE_PROVIDER_SITE_OTHER): Payer: 59 | Admitting: Gastroenterology

## 2012-04-02 DIAGNOSIS — E538 Deficiency of other specified B group vitamins: Secondary | ICD-10-CM

## 2012-04-02 MED ORDER — CYANOCOBALAMIN 1000 MCG/ML IJ SOLN
1000.0000 ug | INTRAMUSCULAR | Status: DC
  Administered 2012-04-02 – 2012-05-04 (×2): 1000 ug via INTRAMUSCULAR

## 2012-04-09 ENCOUNTER — Other Ambulatory Visit: Payer: Self-pay | Admitting: Internal Medicine

## 2012-04-19 ENCOUNTER — Other Ambulatory Visit: Payer: Self-pay | Admitting: Gastroenterology

## 2012-05-04 ENCOUNTER — Ambulatory Visit (INDEPENDENT_AMBULATORY_CARE_PROVIDER_SITE_OTHER): Payer: 59 | Admitting: Gastroenterology

## 2012-05-04 DIAGNOSIS — D518 Other vitamin B12 deficiency anemias: Secondary | ICD-10-CM

## 2012-05-04 DIAGNOSIS — E538 Deficiency of other specified B group vitamins: Secondary | ICD-10-CM

## 2012-05-04 MED ORDER — CYANOCOBALAMIN 1000 MCG/ML IJ SOLN: 1000.0000 ug | Freq: Once | INTRAMUSCULAR | Status: DC

## 2012-05-11 ENCOUNTER — Other Ambulatory Visit (HOSPITAL_COMMUNITY): Payer: Self-pay | Admitting: Orthopedic Surgery

## 2012-05-11 ENCOUNTER — Ambulatory Visit (HOSPITAL_COMMUNITY)
Admission: RE | Admit: 2012-05-11 | Discharge: 2012-05-11 | Disposition: A | Discharge status: Home / Self Care | Payer: 59 | Source: Ambulatory Visit | Attending: Orthopedic Surgery | Admitting: Orthopedic Surgery

## 2012-05-11 DIAGNOSIS — T1590XA Foreign body on external eye, part unspecified, unspecified eye, initial encounter: Secondary | ICD-10-CM

## 2012-05-11 DIAGNOSIS — Z1389 Encounter for screening for other disorder: Secondary | ICD-10-CM | POA: Insufficient documentation

## 2012-06-11 ENCOUNTER — Ambulatory Visit (INDEPENDENT_AMBULATORY_CARE_PROVIDER_SITE_OTHER): Payer: 59 | Admitting: Gastroenterology

## 2012-06-11 DIAGNOSIS — E538 Deficiency of other specified B group vitamins: Secondary | ICD-10-CM

## 2012-06-11 MED ORDER — CYANOCOBALAMIN 1000 MCG/ML IJ SOLN
1000.0000 ug | Freq: Once | INTRAMUSCULAR | Status: AC
  Administered 2012-06-11: 1000 ug via INTRAMUSCULAR

## 2012-07-12 ENCOUNTER — Ambulatory Visit (INDEPENDENT_AMBULATORY_CARE_PROVIDER_SITE_OTHER): Payer: 59 | Admitting: Gastroenterology

## 2012-07-12 DIAGNOSIS — E538 Deficiency of other specified B group vitamins: Secondary | ICD-10-CM

## 2012-07-12 MED ORDER — CYANOCOBALAMIN 1000 MCG/ML IJ SOLN
1000.0000 ug | Freq: Once | INTRAMUSCULAR | Status: AC
  Administered 2012-07-12: 1000 ug via INTRAMUSCULAR

## 2012-08-12 ENCOUNTER — Ambulatory Visit (INDEPENDENT_AMBULATORY_CARE_PROVIDER_SITE_OTHER): Payer: 59 | Admitting: Gastroenterology

## 2012-08-12 DIAGNOSIS — E538 Deficiency of other specified B group vitamins: Secondary | ICD-10-CM

## 2012-08-12 MED ORDER — CYANOCOBALAMIN 1000 MCG/ML IJ SOLN
1000.0000 ug | INTRAMUSCULAR | Status: AC
  Administered 2012-09-13 – 2012-11-15 (×3): 1000 ug via INTRAMUSCULAR

## 2012-08-20 ENCOUNTER — Other Ambulatory Visit: Payer: Self-pay | Admitting: Gastroenterology

## 2012-09-13 ENCOUNTER — Ambulatory Visit (INDEPENDENT_AMBULATORY_CARE_PROVIDER_SITE_OTHER): Payer: 59 | Admitting: Gastroenterology

## 2012-09-13 DIAGNOSIS — E538 Deficiency of other specified B group vitamins: Secondary | ICD-10-CM

## 2012-09-19 ENCOUNTER — Other Ambulatory Visit: Payer: Self-pay | Admitting: Family Medicine

## 2012-10-14 ENCOUNTER — Ambulatory Visit (INDEPENDENT_AMBULATORY_CARE_PROVIDER_SITE_OTHER): Payer: PRIVATE HEALTH INSURANCE | Admitting: Gastroenterology

## 2012-10-14 DIAGNOSIS — E538 Deficiency of other specified B group vitamins: Secondary | ICD-10-CM

## 2012-11-01 ENCOUNTER — Telehealth: Payer: Self-pay | Admitting: *Deleted

## 2012-11-01 MED ORDER — CLONAZEPAM 0.5 MG PO TABS: 0.5000 mg | ORAL_TABLET | Freq: Every evening | ORAL | Status: DC | PRN

## 2012-11-01 NOTE — Telephone Encounter (Signed)
Per dr Yvonne Kendall have klonopin .5 qhs and ov with dr Kirtland Bouchard next week- pt informed and ov made

## 2012-11-01 NOTE — Telephone Encounter (Signed)
Pt calling asking for antidepressant, increased stress at work, marriage under stress.  Pt requesting antidepressant-  Per dr Lovell Sheehan may have klonopin .5 qhs amd see provider in next week

## 2012-11-09 ENCOUNTER — Ambulatory Visit (INDEPENDENT_AMBULATORY_CARE_PROVIDER_SITE_OTHER): Payer: PRIVATE HEALTH INSURANCE | Admitting: Internal Medicine

## 2012-11-09 ENCOUNTER — Encounter: Payer: Self-pay | Admitting: Internal Medicine

## 2012-11-09 VITALS — BP 180/100 | HR 80 | Temp 98.0°F | Resp 18 | Wt 194.0 lb

## 2012-11-09 DIAGNOSIS — F4323 Adjustment disorder with mixed anxiety and depressed mood: Secondary | ICD-10-CM

## 2012-11-09 DIAGNOSIS — G4700 Insomnia, unspecified: Secondary | ICD-10-CM

## 2012-11-09 MED ORDER — CLONAZEPAM 0.5 MG PO TABS: ORAL_TABLET | ORAL | Status: DC

## 2012-11-09 MED ORDER — ESCITALOPRAM OXALATE 10 MG PO TABS: 10.0000 mg | ORAL_TABLET | Freq: Every day | ORAL | Status: DC

## 2012-11-09 NOTE — Progress Notes (Signed)
  Subjective:    Patient ID: Bradley [name], male    DOB: 1965/11/30, 47 y.o.   MRN: [id number]  HPI 47 year old patient who presents today with a chief complaint of stress and insomnia. He states that he has done poorly for over one year. He has considerable situational stressors including work related demands as well as relationship conflicts.  He has been on Klonopin at bedtime for approximately one week without much benefit. He still is sleeping poorly. No prior history of hypertension  BP Readings from Last 3 Encounters:  11/09/12 180/100  03/03/12 140/90  02/04/12 140/90      Review of Systems  Constitutional: Negative for fever, chills, appetite change and fatigue.  HENT: Negative for hearing loss, ear pain, congestion, sore throat, trouble swallowing, neck stiffness, dental problem, voice change and tinnitus.   Eyes: Negative for pain, discharge and visual disturbance.  Respiratory: Negative for cough, chest tightness, wheezing and stridor.   Cardiovascular: Negative for chest pain, palpitations and leg swelling.  Gastrointestinal: Negative for nausea, vomiting, abdominal pain, diarrhea, constipation, blood in stool and abdominal distention.  Genitourinary: Negative for urgency, hematuria, flank pain, discharge, difficulty urinating and genital sores.  Musculoskeletal: Negative for myalgias, back pain, joint swelling, arthralgias and gait problem.  Skin: Negative for rash.  Neurological: Negative for dizziness, syncope, speech difficulty, weakness, numbness and headaches.  Hematological: Negative for adenopathy. Does not bruise/bleed easily.  Psychiatric/Behavioral: Positive for sleep disturbance and dysphoric mood. Negative for behavioral problems. The patient is nervous/anxious.        Objective:   Physical Exam  Constitutional: He appears well-developed and well-nourished. No distress.  Blood pressure 170/100  Psychiatric: He has a normal mood and affect. His behavior is  normal. Judgment and thought content normal.          Assessment & Plan:    Situational stress/insomnia. We'll increase Klonopin to 2 tablets at bedtime. We'll start Lexapro 10 mg every morning.  We'll set up for behavioral health consult. Rule out sustained hypertension. We'll recheck blood pressure and  general status in 6 weeks

## 2012-11-09 NOTE — Patient Instructions (Addendum)
Followup in 6 weeks  Behavior health referral as discussed

## 2012-11-15 ENCOUNTER — Ambulatory Visit (INDEPENDENT_AMBULATORY_CARE_PROVIDER_SITE_OTHER): Payer: PRIVATE HEALTH INSURANCE | Admitting: Gastroenterology

## 2012-11-15 DIAGNOSIS — E538 Deficiency of other specified B group vitamins: Secondary | ICD-10-CM

## 2012-11-24 ENCOUNTER — Other Ambulatory Visit: Payer: Self-pay | Admitting: Internal Medicine

## 2012-12-21 ENCOUNTER — Encounter: Payer: Self-pay | Admitting: Internal Medicine

## 2012-12-21 ENCOUNTER — Ambulatory Visit (INDEPENDENT_AMBULATORY_CARE_PROVIDER_SITE_OTHER): Payer: PRIVATE HEALTH INSURANCE | Admitting: Internal Medicine

## 2012-12-21 VITALS — BP 140/90 | HR 69 | Temp 97.8°F | Resp 18 | Wt 194.0 lb

## 2012-12-21 DIAGNOSIS — F430 Acute stress reaction: Secondary | ICD-10-CM

## 2012-12-21 DIAGNOSIS — G4700 Insomnia, unspecified: Secondary | ICD-10-CM

## 2012-12-21 DIAGNOSIS — F4320 Adjustment disorder, unspecified: Secondary | ICD-10-CM

## 2012-12-21 NOTE — Progress Notes (Signed)
Subjective:    Patient ID: Bradley [name], male    DOB: 17-Jul-1966, 47 y.o.   MRN: [id number]  HPI  48 year old patient who is seen today in followup. He was seen 6 weeks ago with significant insomnia as well as situational stress.  Klonopin was increased to 1 mg at bedtime and he was also placed on Lexapro 10 mg daily. He states that he is sleeping much better and in general has improved. He feels the stress is now manageable and he is quite pleased with his progress. A behavioral health referral was discussed 6 weeks ago but he feels he does not need this at the present time. Work related and interpersonal stressors have improved. He has tolerated medication well  Past Medical History  Diagnosis Date  . Cough variant asthma 10/12/2007  . Vitamin B 12 deficiency   . Liver lesion   . Personal history of colonic polyps 09/28/2009     ADENOMATOUS POLYP  . Irritable bowel syndrome   . Elevated LFTs   . Esophageal reflux   . Hyperlipidemia   . Schatzki's ring   . Hiatal hernia   . GERD (gastroesophageal reflux disease)     History   Social History  . Marital Status: Married    Spouse Name: N/A    Number of Children: 2  . Years of Education: N/A   Occupational History  . ELECTRICIAN    Social History Main Topics  . Smoking status: Never Smoker   . Smokeless tobacco: Never Used  . Alcohol Use: Yes     Comment: occ.  . Drug Use: No  . Sexually Active: Not on file   Other Topics Concern  . Not on file   Social History Narrative   ** Merged History Encounter **        Past Surgical History  Procedure Laterality Date  . Lumbar laminectomy      x2  . Exploration laparotomy and small bowel resection    . Ventral hernia repair    . Leg surgery      left    Family History  Problem Relation Age of Onset  . Colon cancer Neg Hx   . Breast cancer Mother   . Thyroid cancer Mother     Allergies  Allergen Reactions  . Livalo (Pitavastatin) Other (See Comments)    Legs ache     Current Outpatient Prescriptions on File Prior to Visit  Medication Sig Dispense Refill  . clonazePAM (KLONOPIN) 0.5 MG tablet take 1 tablet by mouth at bedtime if needed  30 tablet  2  . DEXILANT 60 MG capsule take 1 capsule by mouth once daily  30 capsule  3  . escitalopram (LEXAPRO) 10 MG tablet Take 1 tablet (10 mg total) by mouth daily.  90 tablet  2  . meloxicam (MOBIC) 15 MG tablet       . [DISCONTINUED] bisoprolol-hydrochlorothiazide (ZIAC) 2.5-6.25 MG per tablet Take 1 tablet by mouth daily.  30 tablet  11   Current Facility-Administered Medications on File Prior to Visit  Medication Dose Route Frequency Provider Last Rate Last Dose  . cyanocobalamin ((VITAMIN B-12)) injection 1,000 mcg  1,000 mcg Intramuscular Q30 days Mardella Layman, MD   1,000 mcg at 11/15/12 0857    BP 140/90  Pulse 69  Temp(Src) 97.8 F (36.6 C) (Oral)  Resp 18  Wt 194 lb (87.998 kg)  BMI 27.07 kg/m2  SpO2 98%       Review of Systems  Psychiatric/Behavioral: Positive for sleep disturbance. The patient is nervous/anxious.        Objective:   Physical Exam  Constitutional: He appears well-developed and well-nourished. No distress.  Blood pressure 140/80  Psychiatric: He has a normal mood and affect. His behavior is normal. Judgment and thought content normal.          Assessment & Plan:   Insomnia/situational stress improved Blood pressure has normalized  We'll continue present regimen at least until followup in 4 months. We'll reassess at that time. The patient will call if any clinical change

## 2012-12-21 NOTE — Patient Instructions (Signed)
It is important that you exercise regularly, at least 20 minutes 3 to 4 times per week.  If you develop chest pain or shortness of breath seek  medical attention.  Followup with Dr. Lovell Sheehan in 4 months

## 2012-12-24 ENCOUNTER — Other Ambulatory Visit: Payer: Self-pay | Admitting: Gastroenterology

## 2012-12-24 NOTE — Telephone Encounter (Signed)
PATIENT NEEDS OFFICE VISIT FOR FURTHER REFILLS  

## 2013-01-24 ENCOUNTER — Other Ambulatory Visit: Payer: Self-pay | Admitting: Gastroenterology

## 2013-01-25 NOTE — Telephone Encounter (Signed)
PATIENT WILL NEED AN OFFICE VISIT FOR FURTHER REFILLS  

## 2013-02-17 ENCOUNTER — Encounter: Payer: Self-pay | Admitting: Internal Medicine

## 2013-02-17 ENCOUNTER — Ambulatory Visit (INDEPENDENT_AMBULATORY_CARE_PROVIDER_SITE_OTHER): Payer: PRIVATE HEALTH INSURANCE | Admitting: Internal Medicine

## 2013-02-17 VITALS — BP 170/100 | HR 72 | Temp 98.2°F | Resp 20 | Wt 192.0 lb

## 2013-02-17 DIAGNOSIS — L03031 Cellulitis of right toe: Secondary | ICD-10-CM

## 2013-02-17 DIAGNOSIS — L03039 Cellulitis of unspecified toe: Secondary | ICD-10-CM

## 2013-02-17 MED ORDER — CLONAZEPAM 0.5 MG PO TABS: ORAL_TABLET | ORAL | Status: DC

## 2013-02-17 MED ORDER — CEPHALEXIN 500 MG PO CAPS: 500.0000 mg | ORAL_CAPSULE | Freq: Three times a day (TID) | ORAL | Status: DC

## 2013-02-17 MED ORDER — DEXLANSOPRAZOLE 60 MG PO CPDR: DELAYED_RELEASE_CAPSULE | ORAL | Status: DC

## 2013-02-17 NOTE — Progress Notes (Signed)
  Subjective:    Patient ID: Bradley [name], male    DOB: 08/30/1966, 47 y.o.   MRN: [id number]  HPI  47 year old patient who presents with a chief complaint of right great toe pain. He states he sustained trauma in November and has had intermittent toe pain since that time. Over the past month he has had worsening pain involving the lateral aspect of the right great toe.  He has similar pain and swelling involving the medial aspect of the same toe that had drainage in resolution of what sounds like an acute paronychia.  Past Medical History  Diagnosis Date  . Cough variant asthma 10/12/2007  . Vitamin B 12 deficiency   . Liver lesion   . Personal history of colonic polyps 09/28/2009     ADENOMATOUS POLYP  . Irritable bowel syndrome   . Elevated LFTs   . Esophageal reflux   . Hyperlipidemia   . Schatzki's ring   . Hiatal hernia   . GERD (gastroesophageal reflux disease)     History   Social History  . Marital Status: Married    Spouse Name: N/A    Number of Children: 2  . Years of Education: N/A   Occupational History  . ELECTRICIAN    Social History Main Topics  . Smoking status: Never Smoker   . Smokeless tobacco: Never Used  . Alcohol Use: Yes     Comment: occ.  . Drug Use: No  . Sexually Active: Not on file   Other Topics Concern  . Not on file   Social History Narrative   ** Merged History Encounter **        Past Surgical History  Procedure Laterality Date  . Lumbar laminectomy      x2  . Exploration laparotomy and small bowel resection    . Ventral hernia repair    . Leg surgery      left    Family History  Problem Relation Age of Onset  . Colon cancer Neg Hx   . Breast cancer Mother   . Thyroid cancer Mother     Allergies  Allergen Reactions  . Livalo (Pitavastatin) Other (See Comments)    Legs ache    Current Outpatient Prescriptions on File Prior to Visit  Medication Sig Dispense Refill  . azelastine (ASTELIN) 137 MCG/SPRAY nasal spray        . escitalopram (LEXAPRO) 10 MG tablet Take 1 tablet (10 mg total) by mouth daily.  90 tablet  2  . [DISCONTINUED] bisoprolol-hydrochlorothiazide (ZIAC) 2.5-6.25 MG per tablet Take 1 tablet by mouth daily.  30 tablet  11   No current facility-administered medications on file prior to visit.    BP 170/100  Pulse 72  Temp(Src) 98.2 F (36.8 C) (Oral)  Resp 20  Wt 192 lb (87.091 kg)  BMI 26.79 kg/m2  SpO2 98%       Review of Systems  Skin: Positive for wound.       Objective:   Physical Exam  Constitutional: He appears well-developed and well-nourished.  HENT:  Repeat blood pressure 144/90  States home blood pressure readings are generally normal  Skin:  Chronic paronychia of the right great lateral toe          Assessment & Plan:   Chronic paronychia. Local wound care discussed;  we'll place on short-term antibiotics

## 2013-02-17 NOTE — Patient Instructions (Signed)
Paronychia Paronychia is an inflammatory reaction involving the folds of the skin surrounding the fingernail. This is commonly caused by an infection in the skin around a nail. The most common cause of paronychia is frequent wetting of the hands (as seen with bartenders, food servers, nurses or others who wet their hands). This makes the skin around the fingernail susceptible to infection by bacteria (germs) or fungus. Other predisposing factors are:  Aggressive manicuring.  Nail biting.  Thumb sucking. The most common cause is a staphylococcal (a type of germ) infection, or a fungal (Candida) infection. When caused by a germ, it usually comes on suddenly with redness, swelling, pus and is often painful. It may get under the nail and form an abscess (collection of pus), or form an abscess around the nail. If the nail itself is infected with a fungus, the treatment is usually prolonged and may require oral medicine for up to one year. Your caregiver will determine the length of time treatment is required. The paronychia caused by bacteria (germs) may largely be avoided by not pulling on hangnails or picking at cuticles. When the infection occurs at the tips of the finger it is called felon. When the cause of paronychia is from the herpes simplex virus (HSV) it is called herpetic whitlow. TREATMENT  When an abscess is present treatment is often incision and drainage. This means that the abscess must be cut open so the pus can get out. When this is done, the following home care instructions should be followed. HOME CARE INSTRUCTIONS   It is important to keep the affected fingers very dry. Rubber or plastic gloves over cotton gloves should be used whenever the hand must be placed in water.  Keep wound clean, dry and dressed as suggested by your caregiver between warm soaks or warm compresses.  Soak in warm water for fifteen to twenty minutes three to four times per day for bacterial infections. Fungal  infections are very difficult to treat, so often require treatment for long periods of time.  For bacterial (germ) infections take antibiotics (medicine which kill germs) as directed and finish the prescription, even if the problem appears to be solved before the medicine is gone.  Only take over-the-counter or prescription medicines for pain, discomfort, or fever as directed by your caregiver. SEEK IMMEDIATE MEDICAL CARE IF:  You have redness, swelling, or increasing pain in the wound.  You notice pus coming from the wound.  You have a fever.  You notice a bad smell coming from the wound or dressing. Document Released: 03/04/2001 Document Revised: 12/01/2011 Document Reviewed: 11/03/2008 [employer] Patient Information 2014 [employer].  

## 2013-02-28 ENCOUNTER — Ambulatory Visit (INDEPENDENT_AMBULATORY_CARE_PROVIDER_SITE_OTHER): Payer: PRIVATE HEALTH INSURANCE | Admitting: Internal Medicine

## 2013-02-28 DIAGNOSIS — D518 Other vitamin B12 deficiency anemias: Secondary | ICD-10-CM

## 2013-02-28 DIAGNOSIS — D519 Vitamin B12 deficiency anemia, unspecified: Secondary | ICD-10-CM

## 2013-02-28 MED ORDER — CYANOCOBALAMIN 1000 MCG/ML IJ SOLN
1000.0000 ug | INTRAMUSCULAR | Status: AC
  Administered 2013-02-28: 1000 ug via INTRAMUSCULAR

## 2013-03-15 ENCOUNTER — Other Ambulatory Visit: Payer: Self-pay | Admitting: Internal Medicine

## 2013-03-28 ENCOUNTER — Telehealth: Payer: Self-pay | Admitting: Gastroenterology

## 2013-03-28 DIAGNOSIS — E538 Deficiency of other specified B group vitamins: Secondary | ICD-10-CM

## 2013-03-28 MED ORDER — CYANOCOBALAMIN 1000 MCG/ML IJ SOLN: INTRAMUSCULAR | Status: DC

## 2013-03-28 NOTE — Telephone Encounter (Signed)
Pt was upset after leaving Dr Lovell Sheehan ofc; he stated they gave him a hard time stating our ofc should be giving B12 injections. Pt states his stepmother and stepsister can give the injections; ordered.

## 2013-05-02 ENCOUNTER — Ambulatory Visit: Payer: PRIVATE HEALTH INSURANCE | Admitting: Internal Medicine

## 2013-06-27 ENCOUNTER — Encounter: Payer: Self-pay | Admitting: Family Medicine

## 2013-06-27 ENCOUNTER — Ambulatory Visit (INDEPENDENT_AMBULATORY_CARE_PROVIDER_SITE_OTHER): Payer: PRIVATE HEALTH INSURANCE | Admitting: Family Medicine

## 2013-06-27 VITALS — BP 142/90 | HR 72 | Temp 97.6°F | Wt 193.0 lb

## 2013-06-27 DIAGNOSIS — IMO0002 Reserved for concepts with insufficient information to code with codable children: Secondary | ICD-10-CM

## 2013-06-27 DIAGNOSIS — Z23 Encounter for immunization: Secondary | ICD-10-CM

## 2013-06-27 DIAGNOSIS — M5416 Radiculopathy, lumbar region: Secondary | ICD-10-CM

## 2013-06-27 MED ORDER — PREDNISONE 10 MG PO TABS: ORAL_TABLET | ORAL | Status: DC

## 2013-06-27 NOTE — Progress Notes (Signed)
  Subjective:    Patient ID: Bradley [name], male    DOB: Feb 03, 1966, 47 y.o.   MRN: [id number]  HPI Patient seen with low back pain right lumbar area with right radiculopathy symptoms He's had some chronic low back pain has had 2 previous lumbosacral disc surgeries. Started having right-sided lumbar back pain about a week ago. No specific injury. Pain radiates to the right knee. No numbness. No weakness. Pain is 6/10 severity and achy quality. Improved with Aleve. Worsened with positional change. Generally tolerating sitting and walking without too much difficulty. Denies any associated urine or stool incontinence, appetite changes, weight changes, fever, chills, or dysuria  Past Medical History  Diagnosis Date  . Cough variant asthma 10/12/2007  . Vitamin B 12 deficiency   . Liver lesion   . Personal history of colonic polyps 09/28/2009     ADENOMATOUS POLYP  . Irritable bowel syndrome   . Elevated LFTs   . Esophageal reflux   . Hyperlipidemia   . Schatzki's ring   . Hiatal hernia   . GERD (gastroesophageal reflux disease)    Past Surgical History  Procedure Laterality Date  . Lumbar laminectomy      x2  . Exploration laparotomy and small bowel resection    . Ventral hernia repair    . Leg surgery      left    reports that he has never smoked. He has never used smokeless tobacco. He reports that  drinks alcohol. He reports that he does not use illicit drugs. family history includes Breast cancer in his mother; Thyroid cancer in his mother. There is no history of Colon cancer. Allergies  Allergen Reactions  . Livalo [Pitavastatin] Other (See Comments)    Legs ache      Review of Systems  Constitutional: Negative for fever, activity change, appetite change and unexpected weight change.  Respiratory: Negative for cough and shortness of breath.   Cardiovascular: Negative for chest pain and leg swelling.  Gastrointestinal: Negative for vomiting and abdominal pain.   Genitourinary: Negative for dysuria, hematuria and flank pain.  Musculoskeletal: Positive for back pain. Negative for joint swelling.  Neurological: Negative for weakness and numbness.       Objective:   Physical Exam  Constitutional: He appears well-developed and well-nourished.  Cardiovascular: Normal rate and regular rhythm.   Pulmonary/Chest: Effort normal and breath sounds normal. No respiratory distress. He has no wheezes. He has no rales.  Musculoskeletal: He exhibits no edema.  Straight leg raises are negative bilaterally  Neurological:  Full-strength lower extremities throughout. 2+ reflexes knee and ankle bilaterally. Normal sensory function.          Assessment & Plan:  Right lumbar radiculopathy symptoms with nonfocal neurologic exam. Prednisone taper starting at 60 mg daily. Touch base in 2 weeks if not improved further.

## 2013-06-27 NOTE — Patient Instructions (Addendum)

## 2013-07-27 ENCOUNTER — Encounter: Payer: Self-pay | Admitting: Family

## 2013-07-27 ENCOUNTER — Ambulatory Visit (INDEPENDENT_AMBULATORY_CARE_PROVIDER_SITE_OTHER): Payer: PRIVATE HEALTH INSURANCE | Admitting: Family

## 2013-07-27 VITALS — BP 130/80 | HR 71 | Wt 196.0 lb

## 2013-07-27 DIAGNOSIS — IMO0002 Reserved for concepts with insufficient information to code with codable children: Secondary | ICD-10-CM

## 2013-07-27 DIAGNOSIS — M545 Low back pain, unspecified: Secondary | ICD-10-CM

## 2013-07-27 DIAGNOSIS — M5416 Radiculopathy, lumbar region: Secondary | ICD-10-CM

## 2013-07-27 MED ORDER — HYDROCODONE-ACETAMINOPHEN 10-325 MG PO TABS: 1.0000 | ORAL_TABLET | Freq: Three times a day (TID) | ORAL | Status: DC | PRN

## 2013-07-27 MED ORDER — PREDNISONE 20 MG PO TABS: ORAL_TABLET | ORAL | Status: AC

## 2013-07-27 NOTE — Progress Notes (Signed)
Subjective:    Patient ID: Bradley [name], male    DOB: Jun 15, 1966, 47 y.o.   MRN: [id number]  HPI 47 year old white male, patient Dr. Lovell Sheehan in today with complaints of low back pain. He was seen by Dr. Caryl Never one month ago was treated with prednisone and got better. Patient reports walking a dog that may have aggravated his pain. Subsequently, he reports bending over to pick up something off the floor and developing excruciating pain to his left lower back. He rates the pain a 4-5/10, worse at night up to a 10 out of 10 radiating down the left leg. Patient is not interested in MRI at this point due to insurance reasons.  Review of Systems  Constitutional: Negative.   Respiratory: Negative.   Cardiovascular: Negative.   Gastrointestinal: Negative.   Genitourinary: Negative.   Musculoskeletal: Positive for back pain. Negative for arthralgias.  Skin: Negative.   Neurological: Negative.   Hematological: Negative.   Psychiatric/Behavioral: Negative.    Past Medical History  Diagnosis Date  . Cough variant asthma 10/12/2007  . Vitamin B 12 deficiency   . Liver lesion   . Personal history of colonic polyps 09/28/2009     ADENOMATOUS POLYP  . Irritable bowel syndrome   . Elevated LFTs   . Esophageal reflux   . Hyperlipidemia   . Schatzki's ring   . Hiatal hernia   . GERD (gastroesophageal reflux disease)     History   Social History  . Marital Status: Married    Spouse Name: N/A    Number of Children: 2  . Years of Education: N/A   Occupational History  . ELECTRICIAN    Social History Main Topics  . Smoking status: Never Smoker   . Smokeless tobacco: Never Used  . Alcohol Use: Yes     Comment: occ.  . Drug Use: No  . Sexual Activity: Not on file   Other Topics Concern  . Not on file   Social History Narrative   ** Merged History Encounter **        Past Surgical History  Procedure Laterality Date  . Lumbar laminectomy      x2  . Exploration laparotomy and  small bowel resection    . Ventral hernia repair    . Leg surgery      left    Family History  Problem Relation Age of Onset  . Colon cancer Neg Hx   . Breast cancer Mother   . Thyroid cancer Mother     Allergies  Allergen Reactions  . Livalo [Pitavastatin] Other (See Comments)    Legs ache    Current Outpatient Prescriptions on File Prior to Visit  Medication Sig Dispense Refill  . azelastine (ASTELIN) 137 MCG/SPRAY nasal spray       . clonazePAM (KLONOPIN) 0.5 MG tablet take 1 tablet by mouth at bedtime if needed  30 tablet  5  . cyanocobalamin (,VITAMIN B-12,) 1000 MCG/ML injection Inject 1 ml into a muscle monthly  1 mL  6  . dexlansoprazole (DEXILANT) 60 MG capsule take 1 capsule by mouth once daily  90 capsule  3  . escitalopram (LEXAPRO) 10 MG tablet Take 1 tablet (10 mg total) by mouth daily.  90 tablet  2  . LIVALO 4 MG TABS TAKE ONE TABLET TWICE A WEEK  10 tablet  11  . [DISCONTINUED] bisoprolol-hydrochlorothiazide (ZIAC) 2.5-6.25 MG per tablet Take 1 tablet by mouth daily.  30 tablet  11   No  current facility-administered medications on file prior to visit.    BP 130/80  Pulse 71  Wt 196 lb (88.905 kg)chart    Objective:   Physical Exam  Constitutional: He is oriented to person, place, and time. He appears well-developed and well-nourished.  Neck: Normal range of motion. Neck supple.  Cardiovascular: Normal rate, regular rhythm and normal heart sounds.   Pulmonary/Chest: Effort normal and breath sounds normal.  Abdominal: Soft. Bowel sounds are normal.  Musculoskeletal: He exhibits tenderness.  Pain to palpation of the left lower back. Worse with SLR on the left. Worse with flexion at the hips.   Neurological: He is alert and oriented to person, place, and time.  Skin: Skin is warm and dry.  Psychiatric: He has a normal mood and affect.          Assessment & Plan:  Assessment:  1. Lumbar radiculopathy 2. Low back pain  Plan: Prednisone 60x3, 40x3,  20x3. Norco as needed for pain at bedtime. Warned of drowsiness. Consider MRI. Patient upon the office with any questions or concerns. Low back strengthening exercises. Recheck as scheduled.

## 2013-07-27 NOTE — Patient Instructions (Signed)
Back Exercises These exercises may help you when beginning to rehabilitate your injury. Your symptoms may resolve with or without further involvement from your physician, physical therapist or athletic trainer. While completing these exercises, remember:   Restoring tissue flexibility helps normal motion to return to the joints. This allows healthier, less painful movement and activity.  An effective stretch should be held for at least 30 seconds.  A stretch should never be painful. You should only feel a gentle lengthening or release in the stretched tissue. STRETCH  Extension, Prone on Elbows   Lie on your stomach on the floor, a bed will be too soft. Place your palms about shoulder width apart and at the height of your head.  Place your elbows under your shoulders. If this is too painful, stack pillows under your chest.  Allow your body to relax so that your hips drop lower and make contact more completely with the floor.  Hold this position for __________ seconds.  Slowly return to lying flat on the floor. Repeat __________ times. Complete this exercise __________ times per day.  RANGE OF MOTION  Extension, Prone Press Ups   Lie on your stomach on the floor, a bed will be too soft. Place your palms about shoulder width apart and at the height of your head.  Keeping your back as relaxed as possible, slowly straighten your elbows while keeping your hips on the floor. You may adjust the placement of your hands to maximize your comfort. As you gain motion, your hands will come more underneath your shoulders.  Hold this position __________ seconds.  Slowly return to lying flat on the floor. Repeat __________ times. Complete this exercise __________ times per day.  RANGE OF MOTION- Quadruped, Neutral Spine   Assume a hands and knees position on a firm surface. Keep your hands under your shoulders and your knees under your hips. You may place padding under your knees for comfort.  Drop  your head and point your tail bone toward the ground below you. This will round out your low back like an angry cat. Hold this position for __________ seconds.  Slowly lift your head and release your tail bone so that your back sags into a large arch, like an old horse.  Hold this position for __________ seconds.  Repeat this until you feel limber in your low back.  Now, find your "sweet spot." This will be the most comfortable position somewhere between the two previous positions. This is your neutral spine. Once you have found this position, tense your stomach muscles to support your low back.  Hold this position for __________ seconds. Repeat __________ times. Complete this exercise __________ times per day.  STRETCH  Flexion, Single Knee to Chest   Lie on a firm bed or floor with both legs extended in front of you.  Keeping one leg in contact with the floor, bring your opposite knee to your chest. Hold your leg in place by either grabbing behind your thigh or at your knee.  Pull until you feel a gentle stretch in your low back. Hold __________ seconds.  Slowly release your grasp and repeat the exercise with the opposite side. Repeat __________ times. Complete this exercise __________ times per day.  STRETCH - Hamstrings, Standing  Stand or sit and extend your right / left leg, placing your foot on a chair or foot stool  Keeping a slight arch in your low back and your hips straight forward.  Lead with your chest and   lean forward at the waist until you feel a gentle stretch in the back of your right / left knee or thigh. (When done correctly, this exercise requires leaning only a small distance.)  Hold this position for __________ seconds. Repeat __________ times. Complete this stretch __________ times per day. STRENGTHENING  Deep Abdominals, Pelvic Tilt   Lie on a firm bed or floor. Keeping your legs in front of you, bend your knees so they are both pointed toward the ceiling and  your feet are flat on the floor.  Tense your lower abdominal muscles to press your low back into the floor. This motion will rotate your pelvis so that your tail bone is scooping upwards rather than pointing at your feet or into the floor.  With a gentle tension and even breathing, hold this position for __________ seconds. Repeat __________ times. Complete this exercise __________ times per day.  STRENGTHENING  Abdominals, Crunches   Lie on a firm bed or floor. Keeping your legs in front of you, bend your knees so they are both pointed toward the ceiling and your feet are flat on the floor. Cross your arms over your chest.  Slightly tip your chin down without bending your neck.  Tense your abdominals and slowly lift your trunk high enough to just clear your shoulder blades. Lifting higher can put excessive stress on the low back and does not further strengthen your abdominal muscles.  Control your return to the starting position. Repeat __________ times. Complete this exercise __________ times per day.  STRENGTHENING  Quadruped, Opposite UE/LE Lift   Assume a hands and knees position on a firm surface. Keep your hands under your shoulders and your knees under your hips. You may place padding under your knees for comfort.  Find your neutral spine and gently tense your abdominal muscles so that you can maintain this position. Your shoulders and hips should form a rectangle that is parallel with the floor and is not twisted.  Keeping your trunk steady, lift your right hand no higher than your shoulder and then your left leg no higher than your hip. Make sure you are not holding your breath. Hold this position __________ seconds.  Continuing to keep your abdominal muscles tense and your back steady, slowly return to your starting position. Repeat with the opposite arm and leg. Repeat __________ times. Complete this exercise __________ times per day. Document Released: 09/26/2005 Document  Revised: 12/01/2011 Document Reviewed: 12/21/2008 [employer] Patient Information 2014 [employer].  

## 2013-09-12 ENCOUNTER — Other Ambulatory Visit: Payer: Self-pay | Admitting: Family

## 2013-09-12 ENCOUNTER — Ambulatory Visit: Payer: PRIVATE HEALTH INSURANCE | Admitting: Family

## 2013-09-12 DIAGNOSIS — M5416 Radiculopathy, lumbar region: Secondary | ICD-10-CM

## 2013-09-23 ENCOUNTER — Ambulatory Visit (INDEPENDENT_AMBULATORY_CARE_PROVIDER_SITE_OTHER): Payer: BC Managed Care – PPO | Admitting: Internal Medicine

## 2013-09-23 ENCOUNTER — Encounter: Payer: Self-pay | Admitting: Internal Medicine

## 2013-09-23 VITALS — BP 123/81 | HR 81 | Temp 98.0°F | Wt 193.0 lb

## 2013-09-23 DIAGNOSIS — J209 Acute bronchitis, unspecified: Secondary | ICD-10-CM

## 2013-09-23 MED ORDER — AZITHROMYCIN 250 MG PO TABS
ORAL_TABLET | ORAL | Status: DC
Start: 2013-09-23 — End: 2014-01-31

## 2013-09-23 MED ORDER — HYDROCODONE-HOMATROPINE 5-1.5 MG/5ML PO SYRP: 5.0000 mL | ORAL_SOLUTION | Freq: Four times a day (QID) | ORAL | Status: DC | PRN

## 2013-09-23 MED ORDER — PREDNISONE 20 MG PO TABS: 20.0000 mg | ORAL_TABLET | Freq: Two times a day (BID) | ORAL | Status: DC

## 2013-09-23 NOTE — Progress Notes (Signed)
   Subjective:    Patient ID: Bradley [name], male    DOB: 12/14/1965, 48 y.o.   MRN: [id number]  HPI   Symptoms began 09/18/13 as malaise followed by a cough which had been productive. He does not observe sputum. The cough has been associated with some shortness of breath but not significant wheezing.  He notes that some coworkers have been ill but he did not have direct contact with them  Other symptoms including head congestion with postnasal drainage. He's had some watery eyes and sneezing. Sore throat , sweats and chills  present without fever.  He's had some discomfort at the anterior temporal bilaterally.  He's had only partial response to DayQuil and NyQuil  He does have reflux but this is well-controlled with Dexilant.  He has never smoked and has no history of asthma.  He did take the flu shot   Review of Systems   He denies frontal headache, facial pain, nasal purulence, otic pain, or otic discharge.     Objective:   Physical Exam General appearance:good health ;well nourished; no acute distress or increased work of breathing is present.  No  lymphadenopathy about the head, neck, or axilla noted.   Eyes: No conjunctival inflammation or lid edema is present.  Ears:  External ear exam shows no significant lesions or deformities.  Otoscopic examination reveals clear canals, tympanic membranes are intact bilaterally without bulging, retraction, inflammation or discharge.  Nose:  External nasal examination shows no deformity or inflammation. Nasal mucosa are pink and moist without lesions or exudates. No septal dislocation or deviation.No obstruction to airflow.   Oral exam: Dental hygiene is good; lips and gums are healthy appearing.There is no oropharyngeal erythema or exudate noted. There is erythema of the uvula.  Neck:  No deformities,  masses, or tenderness noted.    Heart:  Normal rate and regular rhythm. S1 and S2 normal without gallop, murmur, click, rub or other  extra sounds.   Lungs:Chest clear to auscultation; no wheezes, rhonchi,rales ,or rubs present.No increased work of breathing. Significant paroxysms of nonproductive cough.   Extremities:  No cyanosis, edema, or clubbing  noted    Skin: Slightly damp.         Assessment & Plan:  #1 acute bronchitis without rhinosinusitis  Plan: See orders

## 2013-09-23 NOTE — Progress Notes (Signed)
Pre visit review using our clinic review tool, if applicable. No additional management support is needed unless otherwise documented below in the visit note. 

## 2013-09-23 NOTE — Patient Instructions (Signed)

## 2013-09-26 ENCOUNTER — Ambulatory Visit: Payer: PRIVATE HEALTH INSURANCE | Admitting: Family

## 2013-09-30 ENCOUNTER — Other Ambulatory Visit: Payer: Self-pay | Admitting: Family

## 2013-09-30 ENCOUNTER — Ambulatory Visit
Admission: RE | Admit: 2013-09-30 | Discharge: 2013-09-30 | Disposition: A | Discharge status: Home / Self Care | Payer: BC Managed Care – PPO | Source: Ambulatory Visit | Attending: Family | Admitting: Family

## 2013-09-30 DIAGNOSIS — Z139 Encounter for screening, unspecified: Secondary | ICD-10-CM

## 2013-10-02 ENCOUNTER — Ambulatory Visit
Admission: RE | Admit: 2013-10-02 | Discharge: 2013-10-02 | Disposition: A | Discharge status: Home / Self Care | Payer: BC Managed Care – PPO | Source: Ambulatory Visit | Attending: Family | Admitting: Family

## 2013-10-02 DIAGNOSIS — M5416 Radiculopathy, lumbar region: Secondary | ICD-10-CM

## 2013-10-05 ENCOUNTER — Other Ambulatory Visit: Payer: Self-pay

## 2013-10-05 DIAGNOSIS — IMO0002 Reserved for concepts with insufficient information to code with codable children: Secondary | ICD-10-CM

## 2013-10-10 ENCOUNTER — Other Ambulatory Visit: Payer: Self-pay | Admitting: *Deleted

## 2013-10-10 ENCOUNTER — Telehealth: Payer: Self-pay | Admitting: Internal Medicine

## 2013-10-10 MED ORDER — PITAVASTATIN CALCIUM 4 MG PO TABS: ORAL_TABLET | ORAL | Status: DC

## 2013-10-10 MED ORDER — DEXLANSOPRAZOLE 60 MG PO CPDR: DELAYED_RELEASE_CAPSULE | ORAL | Status: DC

## 2013-10-10 NOTE — Telephone Encounter (Signed)
Pt's wife calling to request  refill of  LIVALO 4 MG TABS and dexlansoprazole (DEXILANT) 60 MG capsule sent to [facility]

## 2013-10-11 ENCOUNTER — Other Ambulatory Visit: Payer: Self-pay | Admitting: *Deleted

## 2013-10-11 MED ORDER — PRAVASTATIN SODIUM 40 MG PO TABS: 40.0000 mg | ORAL_TABLET | Freq: Every day | ORAL | Status: DC

## 2013-11-07 ENCOUNTER — Other Ambulatory Visit: Payer: Self-pay

## 2013-11-07 DIAGNOSIS — J209 Acute bronchitis, unspecified: Secondary | ICD-10-CM

## 2013-11-07 MED ORDER — PREDNISONE 20 MG PO TABS
20.0000 mg | ORAL_TABLET | Freq: Two times a day (BID) | ORAL | Status: DC
Start: 2013-11-07 — End: 2014-01-11

## 2013-11-13 ENCOUNTER — Other Ambulatory Visit: Payer: Self-pay | Admitting: Gastroenterology

## 2013-11-13 ENCOUNTER — Other Ambulatory Visit: Payer: Self-pay | Admitting: Internal Medicine

## 2013-11-14 ENCOUNTER — Other Ambulatory Visit: Payer: Self-pay | Admitting: Gastroenterology

## 2013-11-22 ENCOUNTER — Other Ambulatory Visit (INDEPENDENT_AMBULATORY_CARE_PROVIDER_SITE_OTHER): Payer: 59

## 2013-11-22 DIAGNOSIS — Z0000 Encounter for general adult medical examination without abnormal findings: Secondary | ICD-10-CM

## 2013-11-22 LAB — LIPID PANEL
Cholesterol: 174 mg/dL (ref 0–200)
HDL: 43.9 mg/dL (ref >39.00)
LDL Cholesterol: 96 mg/dL (ref 0–99)
TRIGLYCERIDES: 169 mg/dL — AB (ref 0.0–149.0)
Total CHOL/HDL Ratio: 4
VLDL: 33.8 mg/dL (ref 0.0–40.0)

## 2013-11-22 LAB — CBC WITH DIFFERENTIAL/PLATELET
Basophils Absolute: 0 10*3/uL (ref 0.0–0.1)
Basophils Relative: 0.4 % (ref 0.0–3.0)
EOS ABS: 0 10*3/uL (ref 0.0–0.7)
Eosinophils Relative: 0.7 % (ref 0.0–5.0)
HCT: 45.4 % (ref 39.0–52.0)
Hemoglobin: 15 g/dL (ref 13.0–17.0)
LYMPHS ABS: 1.6 10*3/uL (ref 0.7–4.0)
Lymphocytes Relative: 28.3 % (ref 12.0–46.0)
MCHC: 33 g/dL (ref 30.0–36.0)
MCV: 91.6 fl (ref 78.0–100.0)
Monocytes Absolute: 0.4 10*3/uL (ref 0.1–1.0)
Monocytes Relative: 7.8 % (ref 3.0–12.0)
NEUTROS PCT: 62.8 % (ref 43.0–77.0)
Neutro Abs: 3.6 10*3/uL (ref 1.4–7.7)
Platelets: 226 10*3/uL (ref 150.0–400.0)
RBC: 4.96 Mil/uL (ref 4.22–5.81)
RDW: 12.4 % (ref 11.5–14.6)
WBC: 5.7 10*3/uL (ref 4.5–10.5)

## 2013-11-22 LAB — HEPATIC FUNCTION PANEL
ALBUMIN: 4.3 g/dL (ref 3.5–5.2)
ALT: 33 U/L (ref 0–53)
AST: 23 U/L (ref 0–37)
Alkaline Phosphatase: 59 U/L (ref 39–117)
Bilirubin, Direct: 0.1 mg/dL (ref 0.0–0.3)
Total Bilirubin: 0.9 mg/dL (ref 0.3–1.2)
Total Protein: 6.9 g/dL (ref 6.0–8.3)

## 2013-11-22 LAB — POCT URINALYSIS DIPSTICK
BILIRUBIN UA: NEGATIVE
Blood, UA: NEGATIVE
GLUCOSE UA: NEGATIVE
KETONES UA: NEGATIVE
LEUKOCYTES UA: NEGATIVE
Nitrite, UA: NEGATIVE
Protein, UA: NEGATIVE
Spec Grav, UA: <=1.005
Urobilinogen, UA: 0.2
pH, UA: 7

## 2013-11-22 LAB — TSH: TSH: 2.05 u[IU]/mL (ref 0.35–5.50)

## 2013-11-22 LAB — BASIC METABOLIC PANEL
BUN: 16 mg/dL (ref 6–23)
CHLORIDE: 105 meq/L (ref 96–112)
CO2: 29 meq/L (ref 19–32)
Calcium: 9.1 mg/dL (ref 8.4–10.5)
Creatinine, Ser: 0.9 mg/dL (ref 0.4–1.5)
GFR: 94.01 mL/min (ref >60.00)
Glucose, Bld: 85 mg/dL (ref 70–99)
Potassium: 4.1 mEq/L (ref 3.5–5.1)
Sodium: 139 mEq/L (ref 135–145)

## 2013-11-29 ENCOUNTER — Encounter: Payer: Self-pay | Admitting: Family

## 2013-11-29 ENCOUNTER — Ambulatory Visit (INDEPENDENT_AMBULATORY_CARE_PROVIDER_SITE_OTHER): Payer: 59 | Admitting: Family

## 2013-11-29 VITALS — BP 142/88 | HR 62 | Ht 71.0 in | Wt 200.0 lb

## 2013-11-29 DIAGNOSIS — E7800 Pure hypercholesterolemia, unspecified: Secondary | ICD-10-CM

## 2013-11-29 DIAGNOSIS — Z0000 Encounter for general adult medical examination without abnormal findings: Secondary | ICD-10-CM

## 2013-11-29 DIAGNOSIS — E538 Deficiency of other specified B group vitamins: Secondary | ICD-10-CM

## 2013-11-29 DIAGNOSIS — D510 Vitamin B12 deficiency anemia due to intrinsic factor deficiency: Secondary | ICD-10-CM

## 2013-11-29 MED ORDER — CYANOCOBALAMIN 1000 MCG/ML IJ SOLN: INTRAMUSCULAR | Status: DC

## 2013-11-29 NOTE — Progress Notes (Signed)
Subjective:    Patient ID: Bradley [name], male    DOB: Aug 20, 1966, 48 y.o.   MRN: [id number]  HPI 48 year old WM, nonsmoker, is in for a routine wellness  examination for this patient.  I have reviewed all health maintenance to include colonoscopy.  Needed referrals were placed. Age and diagnosis  appropriate screening labs were ordered. His immunization history was reviewed and appropriate vaccinations were ordered. Her current medications and allergies were reviewed and needed refills of her chronic medications were ordered. The plan for yearly health maintenance was discussed all orders and referrals were made as appropriate.  Review of Systems  Constitutional: Negative.   HENT: Negative.   Eyes: Negative.   Respiratory: Negative.   Cardiovascular: Negative.   Gastrointestinal: Negative.   Endocrine: Negative.   Genitourinary: Negative.   Musculoskeletal: Negative.   Skin: Negative.   Allergic/Immunologic: Negative.   Neurological: Negative.   Hematological: Negative.   Psychiatric/Behavioral: Negative.    Past Medical History  Diagnosis Date  . Cough variant asthma 10/12/2007  . Vitamin B 12 deficiency   . Liver lesion   . Personal history of colonic polyps 09/28/2009     ADENOMATOUS POLYP  . Irritable bowel syndrome   . Elevated LFTs   . Esophageal reflux   . Hyperlipidemia   . Schatzki's ring   . Hiatal hernia   . GERD (gastroesophageal reflux disease)     History   Social History  . Marital Status: Married    Spouse Name: N/A    Number of Children: 2  . Years of Education: N/A   Occupational History  . ELECTRICIAN    Social History Main Topics  . Smoking status: Never Smoker   . Smokeless tobacco: Never Used  . Alcohol Use: Yes     Comment: occ.  . Drug Use: No  . Sexual Activity: Not on file   Other Topics Concern  . Not on file   Social History Narrative   ** Merged History Encounter **        Past Surgical History  Procedure Laterality Date    . Lumbar laminectomy      x2  . Exploration laparotomy and small bowel resection    . Ventral hernia repair    . Leg surgery      left    Family History  Problem Relation Age of Onset  . Colon cancer Neg Hx   . Breast cancer Mother   . Thyroid cancer Mother     No Known Allergies  Current Outpatient Prescriptions on File Prior to Visit  Medication Sig Dispense Refill  . azelastine (ASTELIN) 137 MCG/SPRAY nasal spray       . escitalopram (LEXAPRO) 10 MG tablet Take 1 tablet (10 mg total) by mouth daily.  90 tablet  2  . HYDROcodone-acetaminophen (NORCO) 10-325 MG per tablet Take 1 tablet by mouth every 8 (eight) hours as needed.  15 tablet  0  . pravastatin (PRAVACHOL) 40 MG tablet Take 1 tablet (40 mg total) by mouth daily.  90 tablet  3  . azithromycin (ZITHROMAX Z-PAK) 250 MG tablet 2 day 1, then 1 qd  6 each  0  . clonazePAM (KLONOPIN) 0.5 MG tablet take 1 tablet by mouth at bedtime  30 tablet  5  . dexlansoprazole (DEXILANT) 60 MG capsule take 1 capsule by mouth once daily  90 capsule  3  . HYDROcodone-homatropine (HYDROMET) 5-1.5 MG/5ML syrup Take 5 mLs by mouth every 6 (  six) hours as needed for cough.  120 mL  0  . predniSONE (DELTASONE) 20 MG tablet Take 1 tablet (20 mg total) by mouth 2 (two) times daily.  10 tablet  0  . [DISCONTINUED] bisoprolol-hydrochlorothiazide (ZIAC) 2.5-6.25 MG per tablet Take 1 tablet by mouth daily.  30 tablet  11   No current facility-administered medications on file prior to visit.    BP 142/88  Pulse 62  Ht 5\' 11"  (1.803 m)  Wt 200 lb (90.719 kg)  BMI 27.91 kg/m2chart    Objective:   Physical Exam  Constitutional: He is oriented to person, place, and time. He appears well-developed and well-nourished.  HENT:  Head: Normocephalic and atraumatic.  Right Ear: External ear normal.  Left Ear: External ear normal.  Nose: Nose normal.  Mouth/Throat: Oropharynx is clear and moist.  Eyes: Conjunctivae and EOM are normal. Pupils are equal,  round, and reactive to light.  Neck: Normal range of motion. Neck supple. No thyromegaly present.  Cardiovascular: Normal rate, regular rhythm and normal heart sounds.   Pulmonary/Chest: Effort normal and breath sounds normal.  Abdominal: Soft. Bowel sounds are normal.  Genitourinary: Rectum normal, prostate normal and penis normal.  Musculoskeletal: Normal range of motion. He exhibits no edema and no tenderness.  Neurological: He is alert and oriented to person, place, and time. He has normal reflexes. He displays normal reflexes. No cranial nerve deficit. Coordination normal.  Skin: Skin is warm and dry.  Psychiatric: He has a normal mood and affect.          Assessment & Plan:  Bradley [name] was seen today for annual exam.  Diagnoses and associated orders for this visit:  Routine general medical examination at a health care facility  Pernicious anemia  B12 deficiency - cyanocobalamin (,VITAMIN B-12,) 1000 MCG/ML injection; Inject 1 ml into a muscle monthly  Pure hypercholesterolemia   Call the office with any questions or concerns. Recheck as scheduled and as needed. Will need colonoscopy next year

## 2013-11-29 NOTE — Progress Notes (Signed)
Pre visit review using our clinic review tool, if applicable. No additional management support is needed unless otherwise documented below in the visit note. 

## 2013-11-29 NOTE — Patient Instructions (Signed)
Exercise to Stay Healthy Exercise helps you become and stay healthy. EXERCISE IDEAS AND TIPS Choose exercises that:  You enjoy.  Fit into your day. You do not need to exercise really hard to be healthy. You can do exercises at a slow or medium level and stay healthy. You can:  Stretch before and after working out.  Try yoga, Pilates, or tai chi.  Lift weights.  Walk fast, swim, jog, run, climb stairs, bicycle, dance, or rollerskate.  Take aerobic classes. Exercises that burn about 150 calories:  Running 1  miles in 15 minutes.  Playing volleyball for 45 to 60 minutes.  Washing and waxing a car for 45 to 60 minutes.  Playing touch football for 45 minutes.  Walking 1  miles in 35 minutes.  Pushing a stroller 1  miles in 30 minutes.  Playing basketball for 30 minutes.  Raking leaves for 30 minutes.  Bicycling 5 miles in 30 minutes.  Walking 2 miles in 30 minutes.  Dancing for 30 minutes.  Shoveling snow for 15 minutes.  Swimming laps for 20 minutes.  Walking up stairs for 15 minutes.  Bicycling 4 miles in 15 minutes.  Gardening for 30 to 45 minutes.  Jumping rope for 15 minutes.  Washing windows or floors for 45 to 60 minutes. Document Released: 10/11/2010 Document Revised: 12/01/2011 Document Reviewed: 10/11/2010 [employer] Patient Information 2014 [employer].  

## 2014-01-11 ENCOUNTER — Other Ambulatory Visit: Payer: Self-pay | Admitting: Internal Medicine

## 2014-01-13 ENCOUNTER — Telehealth: Payer: Self-pay | Admitting: Internal Medicine

## 2014-01-13 MED ORDER — DEXLANSOPRAZOLE 60 MG PO CPDR: DELAYED_RELEASE_CAPSULE | ORAL | Status: DC

## 2014-01-13 NOTE — Telephone Encounter (Signed)
Pt needs new rx dexilant 60mg #90 with refills sent to new pharm [facility]

## 2014-01-13 NOTE — Telephone Encounter (Signed)
Refill for dexilant sent to pt's pharmacy

## 2014-01-17 ENCOUNTER — Other Ambulatory Visit: Payer: Self-pay

## 2014-01-17 MED ORDER — DEXLANSOPRAZOLE 60 MG PO CPDR: DELAYED_RELEASE_CAPSULE | ORAL | Status: DC

## 2014-01-31 ENCOUNTER — Ambulatory Visit (INDEPENDENT_AMBULATORY_CARE_PROVIDER_SITE_OTHER): Payer: 59 | Admitting: Family

## 2014-01-31 ENCOUNTER — Encounter: Payer: Self-pay | Admitting: Family

## 2014-01-31 VITALS — BP 120/82 | HR 70 | Wt 194.0 lb

## 2014-01-31 DIAGNOSIS — M545 Low back pain, unspecified: Secondary | ICD-10-CM

## 2014-01-31 DIAGNOSIS — M5416 Radiculopathy, lumbar region: Secondary | ICD-10-CM

## 2014-01-31 DIAGNOSIS — IMO0002 Reserved for concepts with insufficient information to code with codable children: Secondary | ICD-10-CM

## 2014-01-31 MED ORDER — PREDNISONE 20 MG PO TABS: ORAL_TABLET | ORAL | Status: AC

## 2014-01-31 MED ORDER — HYDROCODONE-ACETAMINOPHEN 10-325 MG PO TABS: 0.5000 | ORAL_TABLET | Freq: Three times a day (TID) | ORAL | Status: DC | PRN

## 2014-01-31 MED ORDER — PRAVASTATIN SODIUM 40 MG PO TABS: 40.0000 mg | ORAL_TABLET | Freq: Every day | ORAL | Status: DC

## 2014-01-31 NOTE — Patient Instructions (Signed)

## 2014-02-01 NOTE — Progress Notes (Signed)
Subjective:    Patient ID: Bradley [name], male    DOB: 08/04/1966, 48 y.o.   MRN: [id number]  HPI  48 year old white male, nonsmoker exam with complaints of low back pain that radiates down his right leg. He has a history of herniated disc, annular tear of the lower lumbar spine for which he was referred to neurosurgery in January. Patient cancrlled appointment. However, he has an appointment on June 15 to see neurosurgery. States the pain is 7/10, worse with movement. Has not taken any medication for relief. 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  Review of Systems  Constitutional: Negative.   Respiratory: Negative.   Cardiovascular: Negative.   Genitourinary: Negative.   Skin: Negative.   Neurological: Negative.   Psychiatric/Behavioral: Negative.    Past Medical History  Diagnosis Date  . Cough variant asthma 10/12/2007  . Vitamin B 12 deficiency   . Liver lesion   . Personal history of colonic polyps 09/28/2009     ADENOMATOUS POLYP  . Irritable bowel syndrome   . Elevated LFTs   . Esophageal reflux   . Hyperlipidemia   . Schatzki's ring   . Hiatal hernia   . GERD (gastroesophageal reflux disease)     History   Social History  . Marital Status: Married    Spouse Name: N/A    Number of Children: 2  . Years of Education: N/A   Occupational History  . ELECTRICIAN    Social History Main Topics  . Smoking status: Never Smoker   . Smokeless tobacco: Never Used  . Alcohol Use: Yes     Comment: occ.  . Drug Use: No  . Sexual Activity: Not on file   Other Topics Concern  . Not on file   Social History Narrative   ** Merged History Encounter **        Past Surgical History  Procedure Laterality Date  . Lumbar laminectomy      x2  . Exploration laparotomy and small bowel resection    . Ventral hernia repair    . Leg surgery      left    Family History  Problem Relation Age of Onset  . Colon cancer Neg Hx   . Breast cancer Mother   . Thyroid cancer Mother      No Known Allergies  Current Outpatient Prescriptions on File Prior to Visit  Medication Sig Dispense Refill  . cyanocobalamin (,VITAMIN B-12,) 1000 MCG/ML injection Inject 1 ml into a muscle monthly  1 mL  6  . dexlansoprazole (DEXILANT) 60 MG capsule take 1 capsule by mouth once daily  90 capsule  3  . azelastine (ASTELIN) 137 MCG/SPRAY nasal spray       . clonazePAM (KLONOPIN) 0.5 MG tablet take 1 tablet by mouth at bedtime  30 tablet  5  . escitalopram (LEXAPRO) 10 MG tablet Take 1 tablet (10 mg total) by mouth daily.  90 tablet  2  . [DISCONTINUED] bisoprolol-hydrochlorothiazide (ZIAC) 2.5-6.25 MG per tablet Take 1 tablet by mouth daily.  30 tablet  11   No current facility-administered medications on file prior to visit.    BP 120/82  Pulse 70  Wt 194 lb (87.998 kg)  SpO2 98%chart    Objective:   Physical Exam  Constitutional: He is oriented to person, place, and time. He appears well-developed and well-nourished.  Neck: Neck supple.  Cardiovascular: Normal rate, regular rhythm and normal heart sounds.   Pulmonary/Chest: Effort normal and breath sounds normal.  Musculoskeletal: He exhibits tenderness.  Positive straight leg raise maneuver. Tenderness to palpation of the lower lumbar spine.   Neurological: He is alert and oriented to person, place, and time. He has normal reflexes. He displays normal reflexes. No cranial nerve deficit. Coordination normal.  Skin: Skin is warm and dry.  Psychiatric: He has a normal mood and affect.          Assessment & Plan:  Cordarrius was seen today for back pain.  Diagnoses and associated orders for this visit:  Lumbar radiculopathy  Low back pain  Other Orders - pravastatin (PRAVACHOL) 40 MG tablet; Take 1 tablet (40 mg total) by mouth daily. - predniSONE (DELTASONE) 20 MG tablet; 60mg  PO qam x 3 days, 40mg  po qam x 3 days, 20mg  qam x 3 days - HYDROcodone-acetaminophen (NORCO) 10-325 MG per tablet; Take 0.5-1 tablets by mouth  every 8 (eight) hours as needed.   Call the office with any questions or concerns. Follow with neurosurgery as scheduled

## 2014-07-18 ENCOUNTER — Other Ambulatory Visit: Payer: Self-pay | Admitting: Family

## 2014-09-21 ENCOUNTER — Other Ambulatory Visit: Payer: Self-pay | Admitting: Family

## 2014-11-10 ENCOUNTER — Encounter: Payer: Self-pay | Admitting: Gastroenterology

## 2014-11-22 ENCOUNTER — Other Ambulatory Visit: Payer: Self-pay | Admitting: Family

## 2014-12-19 ENCOUNTER — Telehealth: Payer: Self-pay | Admitting: Family Medicine

## 2014-12-19 MED ORDER — DEXLANSOPRAZOLE 60 MG PO CPDR: DELAYED_RELEASE_CAPSULE | ORAL | Status: DC

## 2014-12-19 NOTE — Telephone Encounter (Signed)
Refill request for Dexilant 60 mg take 1 po qd [facility]  Pt has appointment to establish with Dr. Maudie Mercury on 03/14/2015.

## 2014-12-20 ENCOUNTER — Encounter: Payer: Self-pay | Admitting: Family

## 2014-12-20 ENCOUNTER — Ambulatory Visit (INDEPENDENT_AMBULATORY_CARE_PROVIDER_SITE_OTHER): Payer: 59 | Admitting: Family

## 2014-12-20 VITALS — BP 140/90 | HR 81 | Temp 97.9°F | Wt 199.0 lb

## 2014-12-20 DIAGNOSIS — Z86010 Personal history of colonic polyps: Secondary | ICD-10-CM

## 2014-12-20 DIAGNOSIS — E538 Deficiency of other specified B group vitamins: Secondary | ICD-10-CM | POA: Diagnosis not present

## 2014-12-20 DIAGNOSIS — G4700 Insomnia, unspecified: Secondary | ICD-10-CM | POA: Diagnosis not present

## 2014-12-20 DIAGNOSIS — E7800 Pure hypercholesterolemia, unspecified: Secondary | ICD-10-CM

## 2014-12-20 DIAGNOSIS — M5416 Radiculopathy, lumbar region: Secondary | ICD-10-CM | POA: Diagnosis not present

## 2014-12-20 MED ORDER — CYANOCOBALAMIN 1000 MCG/ML IJ SOLN: INTRAMUSCULAR | Status: DC

## 2014-12-20 MED ORDER — PREDNISONE 20 MG PO TABS: ORAL_TABLET | ORAL | Status: AC

## 2014-12-20 MED ORDER — CLONAZEPAM 0.5 MG PO TABS
0.5000 mg | ORAL_TABLET | Freq: Every day | ORAL | Status: DC
Start: 2014-12-20 — End: 2015-07-03

## 2014-12-20 MED ORDER — CYANOCOBALAMIN 1000 MCG/ML IJ SOLN
1000.0000 ug | Freq: Once | INTRAMUSCULAR | Status: AC
  Administered 2014-12-20: 1000 ug via INTRAMUSCULAR

## 2014-12-20 MED ORDER — AZELASTINE HCL 0.1 % NA SOLN: 1.0000 | Freq: Two times a day (BID) | NASAL | Status: DC

## 2014-12-20 NOTE — Patient Instructions (Signed)
Sciatica Sciatica is pain, weakness, numbness, or tingling along the path of the sciatic nerve. The nerve starts in the lower back and runs down the back of each leg. The nerve controls the muscles in the lower leg and in the back of the knee, while also providing sensation to the back of the thigh, lower leg, and the sole of your foot. Sciatica is a symptom of another medical condition. For instance, nerve damage or certain conditions, such as a herniated disk or bone spur on the spine, pinch or put pressure on the sciatic nerve. This causes the pain, weakness, or other sensations normally associated with sciatica. Generally, sciatica only affects one side of the body. CAUSES   Herniated or slipped disc.  Degenerative disk disease.  A pain disorder involving the narrow muscle in the buttocks (piriformis syndrome).  Pelvic injury or fracture.  Pregnancy.  Tumor (rare). SYMPTOMS  Symptoms can vary from mild to very severe. The symptoms usually travel from the low back to the buttocks and down the back of the leg. Symptoms can include:  Mild tingling or dull aches in the lower back, leg, or hip.  Numbness in the back of the calf or sole of the foot.  Burning sensations in the lower back, leg, or hip.  Sharp pains in the lower back, leg, or hip.  Leg weakness.  Severe back pain inhibiting movement. These symptoms may get worse with coughing, sneezing, laughing, or prolonged sitting or standing. Also, being overweight may worsen symptoms. DIAGNOSIS  Your caregiver will perform a physical exam to look for common symptoms of sciatica. He or she may ask you to do certain movements or activities that would trigger sciatic nerve pain. Other tests may be performed to find the cause of the sciatica. These may include:  Blood tests.  X-rays.  Imaging tests, such as an MRI or CT scan. TREATMENT  Treatment is directed at the cause of the sciatic pain. Sometimes, treatment is not necessary  and the pain and discomfort goes away on its own. If treatment is needed, your caregiver may suggest:  Over-the-counter medicines to relieve pain.  Prescription medicines, such as anti-inflammatory medicine, muscle relaxants, or narcotics.  Applying heat or ice to the painful area.  Steroid injections to lessen pain, irritation, and inflammation around the nerve.  Reducing activity during periods of pain.  Exercising and stretching to strengthen your abdomen and improve flexibility of your spine. Your caregiver may suggest losing weight if the extra weight makes the back pain worse.  Physical therapy.  Surgery to eliminate what is pressing or pinching the nerve, such as a bone spur or part of a herniated disk. HOME CARE INSTRUCTIONS   Only take over-the-counter or prescription medicines for pain or discomfort as directed by your caregiver.  Apply ice to the affected area for 20 minutes, 3-4 times a day for the first 48-72 hours. Then try heat in the same way.  Exercise, stretch, or perform your usual activities if these do not aggravate your pain.  Attend physical therapy sessions as directed by your caregiver.  Keep all follow-up appointments as directed by your caregiver.  Do not wear high heels or shoes that do not provide proper support.  Check your mattress to see if it is too soft. A firm mattress may lessen your pain and discomfort. SEEK IMMEDIATE MEDICAL CARE IF:   You lose control of your bowel or bladder (incontinence).  You have increasing weakness in the lower back, pelvis, buttocks,   or legs.  You have redness or swelling of your back.  You have a burning sensation when you urinate.  You have pain that gets worse when you lie down or awakens you at night.  Your pain is worse than you have experienced in the past.  Your pain is lasting longer than 4 weeks.  You are suddenly losing weight without reason. MAKE SURE YOU:  Understand these  instructions.  Will watch your condition.  Will get help right away if you are not doing well or get worse. Document Released: 09/02/2001 Document Revised: 03/09/2012 Document Reviewed: 01/18/2012 Exit[employer] Patient Information 2015 [employer]. This information is not intended to replace advice given to you by your health care provider. Make sure you discuss any questions you have with your health care provider.  

## 2014-12-20 NOTE — Progress Notes (Signed)
Subjective:    Patient ID: Bradley [name], male    DOB: 1966-08-10, 49 y.o.   MRN: [id number]  HPI 49 year old white male, nonsmoker with a history of hyperlipidemia, lumbar radiculopathy is in today requesting a refill on Klonopin that he takes for insomnia. Has been out of the medication for approximately 6 months. Wife encouraged him to come because she's not sleeping well at night. Medication works well the past. Sees neurosurgery for lumbar radiculopathy and had a steroid injection that worked well until now. Last injection was December 2015. Requesting prednisone to help until he is able to get back to neurosurgery. Pain in his back a 6 out of 10 radiates down the left leg. Worse with movement.   Review of Systems  Constitutional: Negative.   HENT: Negative.   Respiratory: Negative.   Cardiovascular: Negative.   Gastrointestinal: Negative.   Endocrine: Negative.   Genitourinary: Negative.   Musculoskeletal: Positive for back pain. Negative for arthralgias.  Skin: Negative.   Allergic/Immunologic: Negative.   Neurological: Negative.   Psychiatric/Behavioral: Negative.    Past Medical History  Diagnosis Date  . Cough variant asthma 10/12/2007  . Vitamin B 12 deficiency   . Liver lesion   . Personal history of colonic polyps 09/28/2009     ADENOMATOUS POLYP  . Irritable bowel syndrome   . Elevated LFTs   . Esophageal reflux   . Hyperlipidemia   . Schatzki's ring   . Hiatal hernia   . GERD (gastroesophageal reflux disease)     History   Social History  . Marital Status: Married    Spouse Name: N/A  . Number of Children: 2  . Years of Education: N/A   Occupational History  . ELECTRICIAN    Social History Main Topics  . Smoking status: Never Smoker   . Smokeless tobacco: Never Used  . Alcohol Use: Yes     Comment: occ.  . Drug Use: No  . Sexual Activity: Not on file   Other Topics Concern  . Not on file   Social History Narrative   ** Merged History Encounter  **        Past Surgical History  Procedure Laterality Date  . Lumbar laminectomy      x2  . Exploration laparotomy and small bowel resection    . Ventral hernia repair    . Leg surgery      left    Family History  Problem Relation Age of Onset  . Colon cancer Neg Hx   . Breast cancer Mother   . Thyroid cancer Mother     No Known Allergies  Current Outpatient Prescriptions on File Prior to Visit  Medication Sig Dispense Refill  . dexlansoprazole (DEXILANT) 60 MG capsule take 1 capsule by mouth once daily 90 capsule 0  . HYDROcodone-acetaminophen (NORCO) 10-325 MG per tablet Take 0.5-1 tablets by mouth every 8 (eight) hours as needed. 15 tablet 0  . pravastatin (PRAVACHOL) 40 MG tablet Take 1 tablet (40 mg total) by mouth daily. 90 tablet 3  . escitalopram (LEXAPRO) 10 MG tablet Take 1 tablet (10 mg total) by mouth daily. (Patient not taking: Reported on 12/20/2014) 90 tablet 2  . [DISCONTINUED] bisoprolol-hydrochlorothiazide (ZIAC) 2.5-6.25 MG per tablet Take 1 tablet by mouth daily. 30 tablet 11   No current facility-administered medications on file prior to visit.    BP 140/90 mmHg  Pulse 81  Temp(Src) 97.9 F (36.6 C) (Oral)  Wt 199 lb (90.266 kg)chart  Objective:   Physical Exam  Constitutional: He is oriented to person, place, and time. He appears well-developed and well-nourished.  HENT:  Right Ear: External ear normal.  Left Ear: External ear normal.  Nose: Nose normal.  Mouth/Throat: Oropharynx is clear and moist.  Neck: Normal range of motion. Neck supple.  Cardiovascular: Normal rate, regular rhythm and normal heart sounds.   Pulmonary/Chest: Effort normal and breath sounds normal.  Abdominal: Soft. Bowel sounds are normal.  Musculoskeletal: Normal range of motion.  Neurological: He is alert and oriented to person, place, and time.  Skin: Skin is warm and dry.  Psychiatric: He has a normal mood and affect.          Assessment & Plan:  Diagnoses  and all orders for this visit:  Insomnia  Lumbar radiculopathy  Pure hypercholesterolemia  History of colonic polyps Orders: -     Ambulatory referral to Gastroenterology  B12 deficiency Orders: -     cyanocobalamin ((VITAMIN B-12)) injection 1,000 mcg; Inject 1 mL (1,000 mcg total) into the muscle once.  Other orders -     cyanocobalamin (,VITAMIN B-12,) 1000 MCG/ML injection; INJECT 1ML IM EVERY MONTH -     clonazePAM (KLONOPIN) 0.5 MG tablet; Take 1 tablet (0.5 mg total) by mouth at bedtime. -     azelastine (ASTELIN) 0.1 % nasal spray; Place 1 spray into both nostrils 2 (two) times daily. -     predniSONE (DELTASONE) 20 MG tablet; 60mg  PO qam x 3 days, 40mg  po qam x 3 days, 20mg  qam x 3 days   Encouraged healthy diet and exercise. Core strengthening exercises to reduce back pain. Medications renewed today. Encouraged complete physical exam with fasting labs. Call the office with any questions or concerns.

## 2014-12-21 ENCOUNTER — Telehealth: Payer: Self-pay

## 2014-12-21 NOTE — Telephone Encounter (Signed)
Rosanna, with [facility] called to advise that pt Rx was "miswritten" by the pharmacy. Prednisone instructions from provider were to take 60mg  x 3 days, 40mg  x 3 days and 20mg  x 3 days. Per Ardine Bjork, pharmacy wrote instructions for pt to take 6 tablets x 3 days, 4 tablets x 3 days and 2 tablets x 3 days. Rosanna wanted to make provider aware of mistake and ask what provider would like for pt to do going forward.  I spoke with Padonda to let her know and ask for further instructions. She advised have pt hold tomorrows dose then continue with taper on Saturday, therefore pt will take 60mg  Saturday then 40mg  x 3 days and 20mg  x 3 days.  Called back to advise Rosanna and she will contact pt.

## 2015-01-03 ENCOUNTER — Encounter: Payer: Self-pay | Admitting: Internal Medicine

## 2015-01-03 ENCOUNTER — Ambulatory Visit (INDEPENDENT_AMBULATORY_CARE_PROVIDER_SITE_OTHER): Payer: 59 | Admitting: Internal Medicine

## 2015-01-03 VITALS — BP 140/90 | HR 84 | Temp 98.1°F | Wt 195.9 lb

## 2015-01-03 DIAGNOSIS — J069 Acute upper respiratory infection, unspecified: Secondary | ICD-10-CM | POA: Diagnosis not present

## 2015-01-03 MED ORDER — HYDROCODONE-HOMATROPINE 5-1.5 MG/5ML PO SYRP: 5.0000 mL | ORAL_SOLUTION | Freq: Two times a day (BID) | ORAL | Status: DC | PRN

## 2015-01-03 NOTE — Assessment & Plan Note (Signed)
49 year old white male with signs and symptoms of probable viral upper respiratory infection. Treat symptomatically for now. If symptoms persist or worsen beyond 10 days to 2 weeks, we discussed treating with cefuroxime 500 mg twice daily for 10 days for possible sinus infection.

## 2015-01-03 NOTE — Progress Notes (Signed)
Subjective:    Patient ID: Bradley [name], male    DOB: 1965-11-23, 49 y.o.   MRN: [id number]  HPI  49 year old white male complains of nasal congestion and a mild sore throat. His symptoms started 5 days ago. He thought his symptoms may have been related to allergic rhinitis. He started using Flonase over-the-counter. His symptoms seem to worsen. He then self treated with Mucinex cold and flu. He has been coughing up yellowish sputum. He also noticed a little bit of blood after blowing his nose. He has mild bilateral sinus pressure.  He is nonsmoker. He works as Clinical biochemist.   Review of Systems Low grade fever, no shortness of breath    Past Medical History  Diagnosis Date  . Cough variant asthma 10/12/2007  . Vitamin B 12 deficiency   . Liver lesion   . Personal history of colonic polyps 09/28/2009     ADENOMATOUS POLYP  . Irritable bowel syndrome   . Elevated LFTs   . Esophageal reflux   . Hyperlipidemia   . Schatzki's ring   . Hiatal hernia   . GERD (gastroesophageal reflux disease)     History   Social History  . Marital Status: Married    Spouse Name: N/A  . Number of Children: 2  . Years of Education: N/A   Occupational History  . ELECTRICIAN    Social History Main Topics  . Smoking status: Never Smoker   . Smokeless tobacco: Never Used  . Alcohol Use: Yes     Comment: occ.  . Drug Use: No  . Sexual Activity: Not on file   Other Topics Concern  . Not on file   Social History Narrative   ** Merged History Encounter **        Past Surgical History  Procedure Laterality Date  . Lumbar laminectomy      x2  . Exploration laparotomy and small bowel resection    . Ventral hernia repair    . Leg surgery      left    Family History  Problem Relation Age of Onset  . Colon cancer Neg Hx   . Breast cancer Mother   . Thyroid cancer Mother     No Known Allergies  Current Outpatient Prescriptions on File Prior to Visit  Medication Sig Dispense Refill    . clonazePAM (KLONOPIN) 0.5 MG tablet Take 1 tablet (0.5 mg total) by mouth at bedtime. 30 tablet 3  . cyanocobalamin (,VITAMIN B-12,) 1000 MCG/ML injection INJECT 1ML IM EVERY MONTH 1 mL 0  . dexlansoprazole (DEXILANT) 60 MG capsule take 1 capsule by mouth once daily 90 capsule 0  . HYDROcodone-acetaminophen (NORCO) 10-325 MG per tablet Take 0.5-1 tablets by mouth every 8 (eight) hours as needed. 15 tablet 0  . PATADAY 0.2 % SOLN Place 0.2 drops into both eyes daily.   6  . pravastatin (PRAVACHOL) 40 MG tablet Take 1 tablet (40 mg total) by mouth daily. 90 tablet 3  . [DISCONTINUED] bisoprolol-hydrochlorothiazide (ZIAC) 2.5-6.25 MG per tablet Take 1 tablet by mouth daily. 30 tablet 11   No current facility-administered medications on file prior to visit.    BP 140/90 mmHg  Pulse 84  Temp(Src) 98.1 F (36.7 C) (Oral)  Wt 195 lb 14.4 oz (88.86 kg)    Objective:   Physical Exam  Constitutional: He is oriented to person, place, and time. He appears well-developed and well-nourished.  Cardiovascular: Normal rate, regular rhythm and normal heart sounds.  No murmur heard. Pulmonary/Chest: Effort normal and breath sounds normal. He has no wheezes.  Musculoskeletal: He exhibits no edema.  Neurological: He is alert and oriented to person, place, and time. No cranial nerve deficit.          Assessment & Plan:

## 2015-01-03 NOTE — Patient Instructions (Signed)
Gargle with warm salt water and use over the counter nasal saline 3-4 times per day as directed Use mucinex DM twice daily as needed Increase fluid intake and get plenty of rest Please contact our office if your symptoms do not improve or gets worse.

## 2015-01-03 NOTE — Progress Notes (Signed)
Pre visit review using our clinic review tool, if applicable. No additional management support is needed unless otherwise documented below in the visit note. 

## 2015-02-21 ENCOUNTER — Other Ambulatory Visit: Payer: Self-pay | Admitting: Family

## 2015-03-14 ENCOUNTER — Ambulatory Visit: Payer: Self-pay | Admitting: Family Medicine

## 2015-03-16 ENCOUNTER — Other Ambulatory Visit: Payer: Self-pay | Admitting: Family

## 2015-03-16 NOTE — Telephone Encounter (Signed)
[facility] has an appt with Dr Yong Channel on 8/18 to establish care.

## 2015-04-22 ENCOUNTER — Other Ambulatory Visit: Payer: Self-pay | Admitting: Family Medicine

## 2015-04-23 NOTE — Telephone Encounter (Signed)
Patient has establish care visit on 8/18 with Dr Yong Channel.

## 2015-05-10 ENCOUNTER — Encounter: Payer: Self-pay | Admitting: Gastroenterology

## 2015-05-10 ENCOUNTER — Ambulatory Visit (INDEPENDENT_AMBULATORY_CARE_PROVIDER_SITE_OTHER): Payer: 59 | Admitting: Family Medicine

## 2015-05-10 ENCOUNTER — Encounter: Payer: Self-pay | Admitting: Family Medicine

## 2015-05-10 DIAGNOSIS — E785 Hyperlipidemia, unspecified: Secondary | ICD-10-CM | POA: Diagnosis not present

## 2015-05-10 DIAGNOSIS — G4700 Insomnia, unspecified: Secondary | ICD-10-CM | POA: Insufficient documentation

## 2015-05-10 DIAGNOSIS — K219 Gastro-esophageal reflux disease without esophagitis: Secondary | ICD-10-CM

## 2015-05-10 DIAGNOSIS — Z1211 Encounter for screening for malignant neoplasm of colon: Secondary | ICD-10-CM | POA: Diagnosis not present

## 2015-05-10 DIAGNOSIS — K8020 Calculus of gallbladder without cholecystitis without obstruction: Secondary | ICD-10-CM | POA: Insufficient documentation

## 2015-05-10 DIAGNOSIS — I10 Essential (primary) hypertension: Secondary | ICD-10-CM

## 2015-05-10 MED ORDER — DEXLANSOPRAZOLE 30 MG PO CPDR: 30.0000 mg | DELAYED_RELEASE_CAPSULE | Freq: Every day | ORAL | Status: DC

## 2015-05-10 MED ORDER — ATORVASTATIN CALCIUM 10 MG PO TABS: 10.0000 mg | ORAL_TABLET | ORAL | Status: DC

## 2015-05-10 NOTE — Patient Instructions (Addendum)
Medication Instructions:  Lower dose of dexilant to 30mg  and see how that does  Change to atorvastatin twice a week and see if improves stomach issues and fatigue  Other Instructions:  [facility] will call you to schedule colonoscopy.  Buy a home cuff at drug store, bring to next visit. Check at least 3x a week and bring a sheet of paper with a log. We will see if you are running high at home and if cuff is accurate, if it is, you are going to need blood pressure support again. You can work on this on your own with dash eating plan.   Labwork: None today, future visit  Testing/Procedures/Immunizations: Flu shot in October or so  Follow-Up (all visit scheduling, rescheduling, cancellations including labs should be scheduled at front desk): 6-8 weeks, come in fasting   [place] stands for "Dietary Approaches to Stop Hypertension." The DASH eating plan is a healthy eating plan that has been shown to reduce high blood pressure (hypertension). Additional health benefits may include reducing the risk of type 2 diabetes mellitus, heart disease, and stroke. The DASH eating plan may also help with weight loss. WHAT DO I NEED TO KNOW ABOUT THE DASH EATING PLAN? For the DASH eating plan, you will follow these general guidelines:  Choose foods with a percent daily value for sodium of less than 5% (as listed on the food label).  Use salt-free seasonings or herbs instead of table salt or sea salt.  Check with your health care provider or pharmacist before using salt substitutes.  Eat lower-sodium products, often labeled as "lower sodium" or "no salt added."  Eat fresh foods.  Eat more vegetables, fruits, and low-fat dairy products.  Choose whole grains. Look for the word "whole" as the first word in the ingredient list.  Choose fish and skinless chicken or [place] more often than red meat. Limit fish, poultry, and meat to 6 oz (170 g) each day.  Limit sweets, desserts,  sugars, and sugary drinks.  Choose heart-healthy fats.  Limit cheese to 1 oz (28 g) per day.  Eat more home-cooked food and less restaurant, buffet, and fast food.  Limit fried foods.  Cook foods using methods other than frying.  Limit canned vegetables. If you do use them, rinse them well to decrease the sodium.  When eating at a restaurant, ask that your food be prepared with less salt, or no salt if possible. WHAT FOODS CAN I EAT? Seek help from a dietitian for individual calorie needs. Grains Whole grain or whole wheat bread. Brown rice. Whole grain or whole wheat pasta. Quinoa, bulgur, and whole grain cereals. Low-sodium cereals. Corn or whole wheat flour tortillas. Whole grain cornbread. Whole grain crackers. Low-sodium crackers. Vegetables Fresh or frozen vegetables (raw, steamed, roasted, or grilled). Low-sodium or reduced-sodium tomato and vegetable juices. Low-sodium or reduced-sodium tomato sauce and paste. Low-sodium or reduced-sodium canned vegetables.  Fruits All fresh, canned (in natural juice), or frozen fruits. Meat and Other Protein Products Ground beef (85% or leaner), grass-fed beef, or beef trimmed of fat. Skinless chicken or [place]. Ground chicken or [place]. Pork trimmed of fat. All fish and seafood. Eggs. Dried beans, peas, or lentils. Unsalted nuts and seeds. Unsalted canned beans. Dairy Low-fat dairy products, such as skim or 1% milk, 2% or reduced-fat cheeses, low-fat ricotta or cottage cheese, or plain low-fat yogurt. Low-sodium or reduced-sodium cheeses. Fats and Oils Tub margarines without trans fats. Light or reduced-fat mayonnaise and salad dressings (  reduced sodium). Avocado. Safflower, olive, or canola oils. Natural peanut or almond butter. Other Unsalted popcorn and pretzels. The items listed above may not be a complete list of recommended foods or beverages. Contact your dietitian for more options. WHAT FOODS ARE NOT RECOMMENDED? Grains White  bread. White pasta. White rice. Refined cornbread. Bagels and croissants. Crackers that contain trans fat. Vegetables Creamed or fried vegetables. Vegetables in a cheese sauce. Regular canned vegetables. Regular canned tomato sauce and paste. Regular tomato and vegetable juices. Fruits Dried fruits. Canned fruit in light or heavy syrup. Fruit juice. Meat and Other Protein Products Fatty cuts of meat. Ribs, chicken wings, bacon, sausage, bologna, salami, chitterlings, fatback, hot dogs, bratwurst, and packaged luncheon meats. Salted nuts and seeds. Canned beans with salt. Dairy Whole or 2% milk, cream, half-and-half, and cream cheese. Whole-fat or sweetened yogurt. Full-fat cheeses or blue cheese. Nondairy creamers and whipped toppings. Processed cheese, cheese spreads, or cheese curds. Condiments Onion and garlic salt, seasoned salt, table salt, and sea salt. Canned and packaged gravies. Worcestershire sauce. Tartar sauce. Barbecue sauce. Teriyaki sauce. Soy sauce, including reduced sodium. Steak sauce. Fish sauce. Oyster sauce. Cocktail sauce. Horseradish. Ketchup and mustard. Meat flavorings and tenderizers. Bouillon cubes. Hot sauce. Tabasco sauce. Marinades. Taco seasonings. Relishes. Fats and Oils Butter, stick margarine, lard, shortening, ghee, and bacon fat. Coconut, palm kernel, or palm oils. Regular salad dressings. Other Pickles and olives. Salted popcorn and pretzels. The items listed above may not be a complete list of foods and beverages to avoid. Contact your dietitian for more information. WHERE CAN I FIND MORE INFORMATION? National Heart, Lung, and Blood Institute: travelstabloid.com Document Released: 08/28/2011 Document Revised: 01/23/2014 Document Reviewed: 07/13/2013 [facility] Patient Information 2015 [place], [place]. This information is not intended to replace advice given to you by your health care provider. Make sure you discuss any questions  you have with your health care provider.

## 2015-05-10 NOTE — Assessment & Plan Note (Signed)
S: tried to come off med, tried multiple OTC including prilosec with no relief. Burning substernal pain only controlled with dexilant, wants to wean down or off A/P:Dexilant 60mg -->trial 30. 6-8 week follow up.

## 2015-05-10 NOTE — Progress Notes (Signed)
Bradley Reddish, MD Phone: [phone]  Subjective:  Patient presents today to establish care with me as their new primary care provider. Patient was formerly a patient of Dr. Arnoldo Morale. Chief complaint-noted.   See problem oriented charting ROS- no chest pain, shortness of breath. Known gallstones- some right mid back pain for about a month. Accident at work right before this. History 3 "bad discs"  The following were reviewed and entered/updated in epic: Past Medical History  Diagnosis Date  . Cough variant asthma 10/12/2007  . Vitamin B 12 deficiency   . Liver lesion   . Personal history of colonic polyps 09/28/2009     ADENOMATOUS POLYP  . Irritable bowel syndrome   . Elevated LFTs   . Esophageal reflux   . Hyperlipidemia   . Schatzki's ring   . Hiatal hernia   . GERD (gastroesophageal reflux disease)   . LATERAL EPICONDYLITIS 03/07/2010    Qualifier: Diagnosis of  By: Elease Hashimoto MD, Bruce     Patient Active Problem List   Diagnosis Date Noted  . Insomnia 05/10/2015    Priority: Medium  . Essential hypertension 05/10/2015    Priority: Medium  . Hyperlipidemia 05/12/2007    Priority: Medium  . GERD 05/12/2007    Priority: Medium  . Gallstones 05/10/2015    Priority: Low  . History of colonic polyps 10/03/2010    Priority: Low  . B12 deficiency 09/05/2009    Priority: Low  . IBS 08/31/2009    Priority: Low  . ALLERGIC RHINITIS, SEASONAL 07/24/2009    Priority: Low  . External hemorrhoids 06/07/2008    Priority: Low   Past Surgical History  Procedure Laterality Date  . Lumbar laminectomy      x2  . Exploration laparotomy and small bowel resection    . Ventral hernia repair    . Leg surgery      left    Family History  Problem Relation Age of Onset  . Colon cancer Neg Hx   . Breast cancer Mother   . Thyroid cancer Mother     Medications- reviewed and updated Current Outpatient Prescriptions  Medication Sig Dispense Refill  . cyanocobalamin (,VITAMIN  B-12,) 1000 MCG/ML injection INJECT 1 ML EVERY MONTH 1 mL 3  . pravastatin (PRAVACHOL) 40 MG tablet TAKE 1 TABLET BY MOUTH EVERY DAY 90 tablet 0  . atorvastatin (LIPITOR) 10 MG tablet Take 1 tablet (10 mg total) by mouth 2 (two) times a week. 30 tablet 3  . clonazePAM (KLONOPIN) 0.5 MG tablet Take 1 tablet (0.5 mg total) by mouth at bedtime. (Patient not taking: Reported on 05/10/2015) 30 tablet 3  . Dexlansoprazole 30 MG capsule Take 1 capsule (30 mg total) by mouth daily. 30 capsule 5  . PATADAY 0.2 % SOLN Place 0.2 drops into both eyes daily.   6  . [DISCONTINUED] bisoprolol-hydrochlorothiazide (ZIAC) 2.5-6.25 MG per tablet Take 1 tablet by mouth daily. 30 tablet 11   No current facility-administered medications for this visit.    Allergies-reviewed and updated No Known Allergies  Social History   Social History  . Marital Status: Married    Spouse Name: N/A  . Number of Children: 2  . Years of Education: N/A   Occupational History  . ELECTRICIAN    Social History Main Topics  . Smoking status: Never Smoker   . Smokeless tobacco: Never Used  . Alcohol Use: Yes     Comment: occ.  . Drug Use: No  . Sexual Activity: Not Asked  Other Topics Concern  . None   Social History Narrative   ** Merged History Encounter **       ROS--See HPI   Objective: BP 150/90 mmHg  Pulse 79  Temp(Src) 98 F (36.7 C)  Wt 194 lb (87.998 kg) Gen: NAD, resting comfortably CV: RRR no murmurs rubs or gallops Lungs: CTAB no crackles, wheeze, rhonchi Abdomen: soft/nontender/nondistended/normal bowel sounds. No rebound or guarding.  MSK: mild paraspinous tenderness on left of lumbar spine Ext: no edema, 2+ PT pulses Skin: warm, dry, no rash Neuro: grossly normal, moves all extremities, PERRLA, normal gait   Assessment/Plan:  Hyperlipidemia S: suspect poorly controlled. Unable to tolerate. Pravastatin 40- GI upset, fatigue. Livalo 4mg  twice a week in past- no issues on med, insurance  wont cover  A/P:Trial atorvastatin 10mg  Twice a week. Labs at follow up visit 6-8 weeks    Essential hypertension S:  Poorly controlled. On no meds. Appears ziac 2013 BP Readings from Last 3 Encounters:  05/10/15 150/90  01/03/15 140/90  12/20/14 140/90  A/P: Dash diet. Patient concerned about white coat- get home monitor and verify next visit. Home monitoring 3x a week. If elevated at follow up likely needs BP med support.    GERD S: tried to come off med, tried multiple OTC including prilosec with no relief. Burning substernal pain only controlled with dexilant, wants to wean down or off A/P:Dexilant 60mg -->trial 30. 6-8 week follow up.   6-8 week f/u or sooner if BP elevated.   Orders Placed This Encounter  Procedures  . Ambulatory referral to Gastroenterology    Referral Priority:  Routine    Referral Type:  Consultation    Referral Reason:  Specialty Services Required    Number of Visits Requested:  1    Meds ordered this encounter  Medications  . Dexlansoprazole 30 MG capsule    Sig: Take 1 capsule (30 mg total) by mouth daily.    Dispense:  30 capsule    Refill:  5  . atorvastatin (LIPITOR) 10 MG tablet    Sig: Take 1 tablet (10 mg total) by mouth 2 (two) times a week.    Dispense:  30 tablet    Refill:  3

## 2015-05-10 NOTE — Assessment & Plan Note (Signed)
S: suspect poorly controlled. Unable to tolerate. Pravastatin 40- GI upset, fatigue. Livalo 4mg  twice a week in past- no issues on med, insurance wont cover  A/P:Trial atorvastatin 10mg  Twice a week. Labs at follow up visit 6-8 weeks

## 2015-05-10 NOTE — Assessment & Plan Note (Signed)
S:  Poorly controlled. On no meds. Appears ziac 2013 BP Readings from Last 3 Encounters:  05/10/15 150/90  01/03/15 140/90  12/20/14 140/90  A/P: Dash diet. Patient concerned about white coat- get home monitor and verify next visit. Home monitoring 3x a week. If elevated at follow up likely needs BP med support.

## 2015-06-28 ENCOUNTER — Encounter: Payer: Self-pay | Admitting: Gastroenterology

## 2015-07-03 ENCOUNTER — Other Ambulatory Visit: Payer: Self-pay | Admitting: Family

## 2015-07-04 NOTE — Telephone Encounter (Signed)
Refill request for Clonazepam 0.5mg  #30.

## 2015-07-04 NOTE — Telephone Encounter (Signed)
Refill ok? 

## 2015-07-05 NOTE — Telephone Encounter (Signed)
Yes thanks 

## 2015-07-20 ENCOUNTER — Encounter: Payer: 59 | Admitting: Gastroenterology

## 2015-07-30 ENCOUNTER — Ambulatory Visit (INDEPENDENT_AMBULATORY_CARE_PROVIDER_SITE_OTHER): Payer: 59 | Admitting: Family Medicine

## 2015-07-30 VITALS — BP 148/82 | HR 70 | Temp 98.2°F | Wt 197.0 lb

## 2015-07-30 DIAGNOSIS — I10 Essential (primary) hypertension: Secondary | ICD-10-CM | POA: Diagnosis not present

## 2015-07-30 DIAGNOSIS — K219 Gastro-esophageal reflux disease without esophagitis: Secondary | ICD-10-CM

## 2015-07-30 DIAGNOSIS — Z23 Encounter for immunization: Secondary | ICD-10-CM

## 2015-07-30 DIAGNOSIS — E785 Hyperlipidemia, unspecified: Secondary | ICD-10-CM

## 2015-07-30 DIAGNOSIS — Z1211 Encounter for screening for malignant neoplasm of colon: Secondary | ICD-10-CM | POA: Diagnosis not present

## 2015-07-30 MED ORDER — DEXLANSOPRAZOLE 30 MG PO CPDR: 30.0000 mg | DELAYED_RELEASE_CAPSULE | Freq: Every day | ORAL | Status: DC

## 2015-07-30 MED ORDER — ATORVASTATIN CALCIUM 10 MG PO TABS: 10.0000 mg | ORAL_TABLET | Freq: Every day | ORAL | Status: DC

## 2015-07-30 MED ORDER — HYDROCHLOROTHIAZIDE 25 MG PO TABS: 25.0000 mg | ORAL_TABLET | Freq: Every day | ORAL | Status: DC

## 2015-07-30 NOTE — Progress Notes (Signed)
Garret Reddish, MD  Subjective:  Bradley [name] is a 49 y.o. year old very pleasant male patient who presents for/with See problem oriented charting ROS- No chest pain or shortness of breath. No headache or blurry vision.   Past Medical History-  Patient Active Problem List   Diagnosis Date Noted  . Insomnia 05/10/2015    Priority: Medium  . Essential hypertension 05/10/2015    Priority: Medium  . Hyperlipidemia 05/12/2007    Priority: Medium  . GERD 05/12/2007    Priority: Medium  . Gallstones 05/10/2015    Priority: Low  . History of colonic polyps 10/03/2010    Priority: Low  . B12 deficiency 09/05/2009    Priority: Low  . IBS 08/31/2009    Priority: Low  . ALLERGIC RHINITIS, SEASONAL 07/24/2009    Priority: Low  . External hemorrhoids 06/07/2008    Priority: Low    Medications- reviewed and updated Current Outpatient Prescriptions  Medication Sig Dispense Refill  . atorvastatin (LIPITOR) 10 MG tablet Take 1 tablet (10 mg total) by mouth daily. 90 tablet 3  . cyanocobalamin (,VITAMIN B-12,) 1000 MCG/ML injection INJECT 1 ML EVERY MONTH 1 mL 3  . Dexlansoprazole 30 MG capsule Take 1 capsule (30 mg total) by mouth daily. 90 capsule 3  . PATADAY 0.2 % SOLN Place 0.2 drops into both eyes daily.   6  . clonazePAM (KLONOPIN) 0.5 MG tablet TAKE 1 TABLET BY MOUTH AT BEDTIME (Patient not taking: Reported on 07/30/2015) 30 tablet 5   No current facility-administered medications for this visit.    Objective: BP 148/82 mmHg  Pulse 70  Temp(Src) 98.2 F (36.8 C)  Wt 197 lb (89.359 kg) Gen: NAD, resting comfortably CV: RRR no murmurs rubs or gallops Lungs: CTAB no crackles, wheeze, rhonchi Abdomen: soft/nontender/nondistended/normal bowel sounds. No rebound or guarding.  Ext: no edema Skin: warm, dry Neuro: grossly normal, moves all extremities  Assessment/Plan:  Hyperlipidemia S: poorly controlled before 04/2015. Started on atorvastatin 10mg  twice a week. Plan was to work  on weight loss and recheck lipids today. Patient ran out of rx after 8 pills and did not pick up refill Lab Results  Component Value Date   CHOL 174 11/22/2013   HDL 43.90 11/22/2013   LDLCALC 96 11/22/2013   LDLDIRECT 191.1 10/20/2011   TRIG 169.0* 11/22/2013   CHOLHDL 4 11/22/2013  A/P: patient has lost no weight and not on statin- does not make sense to check today- Encouraged need for healthy eating, regular exercise, weight loss. We will hold off on repeating lipids until he is actually on medicine- 6 week recheck planned. Rx for daily as suspect may have to increase from twice weekly to control     Essential hypertension S: controlled poorly on no rx. Plan as noted before was for weight loss. Also for home monitoring to see if difference but poorly controlled on 3x a week checks.136-152/72-86 BP Readings from Last 3 Encounters:  07/30/15 148/82  05/10/15 150/90  01/03/15 140/90  A/P: Start HCTZ 25mg  and f/u 6 weeks   GERD S:Dexilant 60mg -->trial 30 mg. No recurrence reflux symptoms A/P: change to 90 day supply and sent in   6 week with labs before visit Orders Placed This Encounter  Procedures  . Flu Vaccine QUAD 36+ mos IM  . LDL cholesterol, direct    [place]    Standing Status: Future     Number of Occurrences:      Standing Expiration Date: 07/29/2016  . Comprehensive  metabolic panel    [place]    Standing Status: Future     Number of Occurrences:      Standing Expiration Date: 07/29/2016  . Ambulatory referral to Gastroenterology    Referral Priority:  Routine    Referral Type:  Consultation    Referral Reason:  Specialty Services Required    Number of Visits Requested:  1  needs colonoscopy  Meds ordered this encounter  Medications  . atorvastatin (LIPITOR) 10 MG tablet    Sig: Take 1 tablet (10 mg total) by mouth daily.    Dispense:  90 tablet    Refill:  3  . Dexlansoprazole 30 MG capsule    Sig: Take 1 capsule (30 mg total) by mouth daily.     Dispense:  90 capsule    Refill:  3  . hydrochlorothiazide (HYDRODIURIL) 25 MG tablet    Sig: Take 1 tablet (25 mg total) by mouth daily.    Dispense:  30 tablet    Refill:  5

## 2015-07-30 NOTE — Patient Instructions (Addendum)
Flu shot received today.  Start with atorvastatin twice a week- we may increase if needed depending on cholesterol level  Blood pressure- start hydrochlorothiazide (light diuretic that helps for blood pressure) . Our goal is for all blood pressures to be <140/90.  Continue home monitoring and update me on your 3x a week checks BP Readings from Last 3 Encounters:  07/30/15 148/82  05/10/15 150/90  01/03/15 140/90   Glad lower dose dexilant working- sent in 90 days  See me in 6 weeks with labs a few days before  For mid back pain- trial ice for 3 days for 20 minutes, then may switch to heat. 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 Can be in existing spot or may create a slot.

## 2015-07-31 NOTE — Assessment & Plan Note (Addendum)
S: controlled poorly on no rx. Plan as noted before was for weight loss. Also for home monitoring to see if difference but poorly controlled on 3x a week checks.136-152/72-86 BP Readings from Last 3 Encounters:  07/30/15 148/82  05/10/15 150/90  01/03/15 140/90  A/P: Start HCTZ 25mg  and f/u 6 weeks

## 2015-07-31 NOTE — Assessment & Plan Note (Signed)
S:Dexilant 60mg -->trial 30 mg. No recurrence reflux symptoms A/P: change to 90 day supply and sent in

## 2015-07-31 NOTE — Assessment & Plan Note (Addendum)
S: poorly controlled before 04/2015. Started on atorvastatin 10mg  twice a week. Plan was to work on weight loss and recheck lipids today. Patient ran out of rx after 8 pills and did not pick up refill Lab Results  Component Value Date   CHOL 174 11/22/2013   HDL 43.90 11/22/2013   LDLCALC 96 11/22/2013   LDLDIRECT 191.1 10/20/2011   TRIG 169.0* 11/22/2013   CHOLHDL 4 11/22/2013  A/P: patient has lost no weight and not on statin- does not make sense to check today- Encouraged need for healthy eating, regular exercise, weight loss. We will hold off on repeating lipids until he is actually on medicine- 6 week recheck planned. Rx for daily as suspect may have to increase from twice weekly to control

## 2015-08-21 ENCOUNTER — Other Ambulatory Visit: Payer: Self-pay | Admitting: Family Medicine

## 2015-09-03 ENCOUNTER — Encounter: Payer: 59 | Admitting: Gastroenterology

## 2015-09-05 ENCOUNTER — Ambulatory Visit (INDEPENDENT_AMBULATORY_CARE_PROVIDER_SITE_OTHER): Payer: 59 | Admitting: Adult Health

## 2015-09-05 ENCOUNTER — Encounter: Payer: Self-pay | Admitting: Adult Health

## 2015-09-05 ENCOUNTER — Other Ambulatory Visit: Payer: 59

## 2015-09-05 VITALS — BP 118/80 | HR 109 | Temp 98.2°F | Ht 71.0 in | Wt 192.2 lb

## 2015-09-05 DIAGNOSIS — R197 Diarrhea, unspecified: Secondary | ICD-10-CM | POA: Diagnosis not present

## 2015-09-05 DIAGNOSIS — J0100 Acute maxillary sinusitis, unspecified: Secondary | ICD-10-CM

## 2015-09-05 DIAGNOSIS — J209 Acute bronchitis, unspecified: Secondary | ICD-10-CM

## 2015-09-05 MED ORDER — HYDROCODONE-HOMATROPINE 5-1.5 MG/5ML PO SYRP: 5.0000 mL | ORAL_SOLUTION | Freq: Three times a day (TID) | ORAL | Status: DC | PRN

## 2015-09-05 MED ORDER — DOXYCYCLINE HYCLATE 100 MG PO CAPS: 100.0000 mg | ORAL_CAPSULE | Freq: Two times a day (BID) | ORAL | Status: DC

## 2015-09-05 MED ORDER — METHYLPREDNISOLONE 4 MG PO TBPK: ORAL_TABLET | ORAL | Status: DC

## 2015-09-05 NOTE — Progress Notes (Signed)
   Subjective:    Patient ID: Bradley [name], male    DOB: November 10, 1965, 49 y.o.   MRN: [id number]  HPI  49 year old male, patient of Dr. Yong Channel, presents to the office today for two acute complaints.   1) He has had cough, congestion, sneezing, sinus pain and pressure, body aches and chill for the last 6 days. He had a reportable fever of 101 F last night. He is using  Mucinex for his cough and Dayquil during the day to help with his sinus pain and pressure.   2) He also complains of diarrhea since yesterday. He has not had any normal bowel movements since last night. He has slight abdominal cramping.  No nausea or vomiting. Has not used anything OTC to help with nausea.     Review of Systems  Constitutional: Positive for fever, chills, diaphoresis, activity change and fatigue.  HENT: Positive for congestion, postnasal drip, rhinorrhea, sinus pressure and sneezing. Negative for sore throat.   Eyes: Negative.   Respiratory: Positive for cough and shortness of breath. Negative for choking, chest tightness and wheezing.   Cardiovascular: Negative.   Gastrointestinal: Positive for abdominal pain (cramping) and diarrhea. Negative for nausea and vomiting.  Neurological: Negative.   Psychiatric/Behavioral: Positive for sleep disturbance (from cough).  All other systems reviewed and are negative.      Objective:   Physical Exam  Constitutional: He is oriented to person, place, and time. He appears well-developed and well-nourished. No distress.  HENT:  Head: Normocephalic and atraumatic.  Right Ear: External ear normal.  Left Ear: External ear normal.  Nose: Nose normal.  Mouth/Throat: Oropharynx is clear and moist. No oropharyngeal exudate.  Neck: Normal range of motion. Neck supple.  Cardiovascular: Normal rate, regular rhythm, normal heart sounds and intact distal pulses.  Exam reveals no gallop.   No murmur heard. Pulmonary/Chest: Effort normal and breath sounds normal. No respiratory  distress. He has no wheezes. He has no rales. He exhibits no tenderness.  Abdominal: Soft. Bowel sounds are normal. He exhibits no distension and no mass. There is no tenderness. There is no rebound and no guarding.  Lymphadenopathy:    He has cervical adenopathy (cervical ).  Neurological: He is alert and oriented to person, place, and time.  Skin: Skin is warm. No rash noted. He is diaphoretic. No erythema. No pallor.  Psychiatric: He has a normal mood and affect. His behavior is normal. Judgment and thought content normal.  Nursing note and vitals reviewed.      Assessment & Plan:  1. Acute maxillary sinusitis, recurrence not specified - Low concern for pneumonia.  - doxycycline (VIBRAMYCIN) 100 MG capsule; Take 1 capsule (100 mg total) by mouth 2 (two) times daily.  Dispense: 14 capsule; Refill: 0 - methylPREDNISolone (MEDROL DOSEPAK) 4 MG TBPK tablet; Take as directed  Dispense: 21 tablet; Refill: 0  2. Diarrhea, unspecified type - Can use imodium for the next 24 hours.  - Stay hydrated - Doubt infectious diarrhea  3. Acute bronchitis, unspecified organism - methylPREDNISolone (MEDROL DOSEPAK) 4 MG TBPK tablet; Take as directed  Dispense: 21 tablet; Refill: 0 - HYDROcodone-homatropine (HYCODAN) 5-1.5 MG/5ML syrup; Take 5 mLs by mouth every 8 (eight) hours as needed for cough.  Dispense: 120 mL; Refill: 0 - Mucinex during the day. - Follow up if no improvement in 2-3 days.

## 2015-09-05 NOTE — Patient Instructions (Addendum)
It was great meeting you!   I have sent in a prescription for Prednisone and Doxycycline. Please take these as directed.   Use the cough syrup at night as it will make you sleepy.   Mucinex during the day is fine.   Also, take a few doses of Imodium to help with the constipation.   Acute Bronchitis Bronchitis is inflammation of the airways that extend from the windpipe into the lungs (bronchi). The inflammation often causes mucus to develop. This leads to a cough, which is the most common symptom of bronchitis.  In acute bronchitis, the condition usually develops suddenly and goes away over time, usually in a couple weeks. Smoking, allergies, and asthma can make bronchitis worse. Repeated episodes of bronchitis may cause further lung problems.  CAUSES Acute bronchitis is most often caused by the same virus that causes a cold. The virus can spread from person to person (contagious) through coughing, sneezing, and touching contaminated objects. SIGNS AND SYMPTOMS   Cough.   Fever.   Coughing up mucus.   Body aches.   Chest congestion.   Chills.   Shortness of breath.   Sore throat.  DIAGNOSIS  Acute bronchitis is usually diagnosed through a physical exam. Your health care provider will also ask you questions about your medical history. Tests, such as chest X-rays, are sometimes done to rule out other conditions.  TREATMENT  Acute bronchitis usually goes away in a couple weeks. Oftentimes, no medical treatment is necessary. Medicines are sometimes given for relief of fever or cough. Antibiotic medicines are usually not needed but may be prescribed in certain situations. In some cases, an inhaler may be recommended to help reduce shortness of breath and control the cough. A cool mist vaporizer may also be used to help thin bronchial secretions and make it easier to clear the chest.  HOME CARE INSTRUCTIONS  Get plenty of rest.   Drink enough fluids to keep your urine clear  or pale yellow (unless you have a medical condition that requires fluid restriction). Increasing fluids may help thin your respiratory secretions (sputum) and reduce chest congestion, and it will prevent dehydration.   Take medicines only as directed by your health care provider.  If you were prescribed an antibiotic medicine, finish it all even if you start to feel better.  Avoid smoking and secondhand smoke. Exposure to cigarette smoke or irritating chemicals will make bronchitis worse. If you are a smoker, consider using nicotine gum or skin patches to help control withdrawal symptoms. Quitting smoking will help your lungs heal faster.   Reduce the chances of another bout of acute bronchitis by washing your hands frequently, avoiding people with cold symptoms, and trying not to touch your hands to your mouth, nose, or eyes.   Keep all follow-up visits as directed by your health care provider.  SEEK MEDICAL CARE IF: Your symptoms do not improve after 1 week of treatment.  SEEK IMMEDIATE MEDICAL CARE IF:  You develop an increased fever or chills.   You have chest pain.   You have severe shortness of breath.  You have bloody sputum.   You develop dehydration.  You faint or repeatedly feel like you are going to pass out.  You develop repeated vomiting.  You develop a severe headache. MAKE SURE YOU:   Understand these instructions.  Will watch your condition.  Will get help right away if you are not doing well or get worse.   This information is not intended to  replace advice given to you by your health care provider. Make sure you discuss any questions you have with your health care provider.      Document Released: 10/16/2004 Document Revised: 09/29/2014 Document Reviewed: 03/01/2013 Elsevier Interactive Patient Education 2016 Else[employer] Sinusitis, Adult Sinusitis is redness, soreness, and inflammation of the paranasal sinuses. Paranasal sinuses are air  pockets within the bones of your face. They are located beneath your eyes, in the middle of your forehead, and above your eyes. In healthy paranasal sinuses, mucus is able to drain out, and air is able to circulate through them by way of your nose. However, when your paranasal sinuses are inflamed, mucus and air can become trapped. This can allow bacteria and other germs to grow and cause infection. Sinusitis can develop quickly and last only a short time (acute) or continue over a long period (chronic). Sinusitis that lasts for more than 12 weeks is considered chronic. CAUSES Causes of sinusitis include:  Allergies.  Structural abnormalities, such as displacement of the cartilage that separates your nostrils (deviated septum), which can decrease the air flow through your nose and sinuses and affect sinus drainage.  Functional abnormalities, such as when the small hairs (cilia) that line your sinuses and help remove mucus do not work properly or are not present. SIGNS AND SYMPTOMS Symptoms of acute and chronic sinusitis are the same. The primary symptoms are pain and pressure around the affected sinuses. Other symptoms include:  Upper toothache.  Earache.  Headache.  Bad breath.  Decreased sense of smell and taste.  A cough, which worsens when you are lying flat.  Fatigue.  Fever.  Thick drainage from your nose, which often is green and may contain pus (purulent).  Swelling and warmth over the affected sinuses. DIAGNOSIS Your health care provider will perform a physical exam. During your exam, your health care provider may perform any of the following to help determine if you have acute sinusitis or chronic sinusitis:  Look in your nose for signs of abnormal growths in your nostrils (nasal polyps).  Tap over the affected sinus to check for signs of infection.  View the inside of your sinuses using an imaging device that has a light attached (endoscope). If your health care  provider suspects that you have chronic sinusitis, one or more of the following tests may be recommended:  Allergy tests.  Nasal culture. A sample of mucus is taken from your nose, sent to a lab, and screened for bacteria.  Nasal cytology. A sample of mucus is taken from your nose and examined by your health care provider to determine if your sinusitis is related to an allergy. TREATMENT Most cases of acute sinusitis are related to a viral infection and will resolve on their own within 10 days. Sometimes, medicines are prescribed to help relieve symptoms of both acute and chronic sinusitis. These may include pain medicines, decongestants, nasal steroid sprays, or saline sprays. However, for sinusitis related to a bacterial infection, your health care provider will prescribe antibiotic medicines. These are medicines that will help kill the bacteria causing the infection. Rarely, sinusitis is caused by a fungal infection. In these cases, your health care provider will prescribe antifungal medicine. For some cases of chronic sinusitis, surgery is needed. Generally, these are cases in which sinusitis recurs more than 3 times per year, despite other treatments. HOME CARE INSTRUCTIONS  Drink plenty of water. Water helps thin the mucus so your sinuses can drain more easily.  Use a humidifier.  Inhale steam 3-4 times a day (for example, sit in the bathroom with the shower running).  Apply a warm, moist washcloth to your face 3-4 times a day, or as directed by your health care provider.  Use saline nasal sprays to help moisten and clean your sinuses.  Take medicines only as directed by your health care provider.  If you were prescribed either an antibiotic or antifungal medicine, finish it all even if you start to feel better. SEEK IMMEDIATE MEDICAL CARE IF:  You have increasing pain or severe headaches.  You have nausea, vomiting, or drowsiness.  You have swelling around your face.  You  have vision problems.  You have a stiff neck.  You have difficulty breathing.   This information is not intended to replace advice given to you by your health care provider. Make sure you discuss any questions you have with your health care provider.   Document Released: 09/08/2005 Document Revised: 09/29/2014 Document Reviewed: 09/23/2011 Elsevier Interactive Patient Education [employer].

## 2015-09-05 NOTE — Progress Notes (Signed)
Pre visit review using our clinic review tool, if applicable. No additional management support is needed unless otherwise documented below in the visit note. 

## 2015-09-11 ENCOUNTER — Ambulatory Visit: Payer: 59 | Admitting: Family Medicine

## 2015-09-14 ENCOUNTER — Encounter: Payer: 59 | Admitting: Family Medicine

## 2015-10-12 ENCOUNTER — Encounter: Payer: Self-pay | Admitting: Family Medicine

## 2015-12-12 ENCOUNTER — Other Ambulatory Visit: Payer: Self-pay | Admitting: *Deleted

## 2015-12-12 MED ORDER — HYDROCHLOROTHIAZIDE 25 MG PO TABS: 25.0000 mg | ORAL_TABLET | Freq: Every day | ORAL | Status: DC

## 2015-12-12 NOTE — Telephone Encounter (Signed)
Rx done. 

## 2015-12-13 ENCOUNTER — Other Ambulatory Visit: Payer: Self-pay | Admitting: General Practice

## 2015-12-13 MED ORDER — HYDROCHLOROTHIAZIDE 25 MG PO TABS: 25.0000 mg | ORAL_TABLET | Freq: Every day | ORAL | Status: DC

## 2016-01-09 ENCOUNTER — Encounter: Payer: Self-pay | Admitting: Gastroenterology

## 2016-01-14 ENCOUNTER — Telehealth: Payer: Self-pay | Admitting: *Deleted

## 2016-01-14 NOTE — Telephone Encounter (Signed)
Prior authorization for Dexilant 30mg  received and completed through [facility]

## 2016-01-15 NOTE — Telephone Encounter (Signed)
Prior authorization for Dexilant 30mg  approved.

## 2016-02-05 ENCOUNTER — Telehealth: Payer: Self-pay | Admitting: Family Medicine

## 2016-02-05 NOTE — Telephone Encounter (Signed)
Okay to refill Clonazepam?

## 2016-02-05 NOTE — Telephone Encounter (Signed)
Left message on voicemail to call office. Rx called into the pharmacy. Pt needs to schedule appt before he runs out again per Dr.Hunter.

## 2016-02-05 NOTE — Telephone Encounter (Signed)
Pt has been scheduled 6/05 for med follow up appointment and then he has scheduled his cpe in august.

## 2016-02-05 NOTE — Telephone Encounter (Signed)
Noted  

## 2016-02-05 NOTE — Telephone Encounter (Signed)
Yes thanks, may give #30 but advise follow up before he runs out

## 2016-02-25 ENCOUNTER — Ambulatory Visit: Payer: 59 | Admitting: Family Medicine

## 2016-04-11 ENCOUNTER — Inpatient Hospital Stay (HOSPITAL_COMMUNITY): Payer: 59

## 2016-04-11 ENCOUNTER — Encounter (HOSPITAL_COMMUNITY): Payer: Self-pay | Admitting: Emergency Medicine

## 2016-04-11 ENCOUNTER — Inpatient Hospital Stay (HOSPITAL_COMMUNITY)
Admission: EM | Admit: 2016-04-11 | Discharge: 2016-04-13 | DRG: 390 | Disposition: A | Discharge status: Home / Self Care | Payer: 59 | Attending: Internal Medicine | Admitting: Internal Medicine

## 2016-04-11 ENCOUNTER — Emergency Department (HOSPITAL_COMMUNITY): Payer: 59

## 2016-04-11 DIAGNOSIS — E876 Hypokalemia: Secondary | ICD-10-CM | POA: Diagnosis present

## 2016-04-11 DIAGNOSIS — K219 Gastro-esophageal reflux disease without esophagitis: Secondary | ICD-10-CM | POA: Diagnosis present

## 2016-04-11 DIAGNOSIS — K5669 Other intestinal obstruction: Secondary | ICD-10-CM

## 2016-04-11 DIAGNOSIS — K56609 Unspecified intestinal obstruction, unspecified as to partial versus complete obstruction: Secondary | ICD-10-CM

## 2016-04-11 DIAGNOSIS — Z9889 Other specified postprocedural states: Secondary | ICD-10-CM

## 2016-04-11 DIAGNOSIS — Z86010 Personal history of colonic polyps: Secondary | ICD-10-CM | POA: Diagnosis not present

## 2016-04-11 DIAGNOSIS — R17 Unspecified jaundice: Secondary | ICD-10-CM | POA: Diagnosis present

## 2016-04-11 DIAGNOSIS — Z8719 Personal history of other diseases of the digestive system: Secondary | ICD-10-CM | POA: Diagnosis present

## 2016-04-11 DIAGNOSIS — I10 Essential (primary) hypertension: Secondary | ICD-10-CM | POA: Diagnosis present

## 2016-04-11 DIAGNOSIS — E785 Hyperlipidemia, unspecified: Secondary | ICD-10-CM | POA: Diagnosis present

## 2016-04-11 DIAGNOSIS — K449 Diaphragmatic hernia without obstruction or gangrene: Secondary | ICD-10-CM | POA: Diagnosis present

## 2016-04-11 DIAGNOSIS — R109 Unspecified abdominal pain: Secondary | ICD-10-CM | POA: Diagnosis not present

## 2016-04-11 DIAGNOSIS — K5660 Unspecified intestinal obstruction: Secondary | ICD-10-CM | POA: Diagnosis present

## 2016-04-11 LAB — CBC WITH DIFFERENTIAL/PLATELET
BASOS PCT: 0 %
Basophils Absolute: 0 10*3/uL (ref 0.0–0.1)
Eosinophils Absolute: 0 10*3/uL (ref 0.0–0.7)
Eosinophils Relative: 0 %
HEMATOCRIT: 40.5 % (ref 39.0–52.0)
HEMOGLOBIN: 13.9 g/dL (ref 13.0–17.0)
LYMPHS ABS: 0.6 10*3/uL — AB (ref 0.7–4.0)
Lymphocytes Relative: 6 %
MCH: 30.8 pg (ref 26.0–34.0)
MCHC: 34.3 g/dL (ref 30.0–36.0)
MCV: 89.6 fL (ref 78.0–100.0)
MONOS PCT: 8 %
Monocytes Absolute: 0.9 10*3/uL (ref 0.1–1.0)
NEUTROS ABS: 8.9 10*3/uL — AB (ref 1.7–7.7)
NEUTROS PCT: 86 %
Platelets: 170 10*3/uL (ref 150–400)
RBC: 4.52 MIL/uL (ref 4.22–5.81)
RDW: 13.2 % (ref 11.5–15.5)
WBC: 10.4 10*3/uL (ref 4.0–10.5)

## 2016-04-11 LAB — COMPREHENSIVE METABOLIC PANEL
ALBUMIN: 4.6 g/dL (ref 3.5–5.0)
ALT: 45 U/L (ref 17–63)
ANION GAP: 7 (ref 5–15)
AST: 28 U/L (ref 15–41)
Alkaline Phosphatase: 83 U/L (ref 38–126)
BUN: 15 mg/dL (ref 6–20)
CHLORIDE: 107 mmol/L (ref 101–111)
CO2: 28 mmol/L (ref 22–32)
Calcium: 9.2 mg/dL (ref 8.9–10.3)
Creatinine, Ser: 1.06 mg/dL (ref 0.61–1.24)
GFR calc Af Amer: >60 mL/min (ref >60)
GFR calc non Af Amer: >60 mL/min (ref >60)
GLUCOSE: 113 mg/dL — AB (ref 65–99)
POTASSIUM: 3.3 mmol/L — AB (ref 3.5–5.1)
SODIUM: 142 mmol/L (ref 135–145)
Total Bilirubin: 1.3 mg/dL — ABNORMAL HIGH (ref 0.3–1.2)
Total Protein: 7.2 g/dL (ref 6.5–8.1)

## 2016-04-11 LAB — URINALYSIS, ROUTINE W REFLEX MICROSCOPIC
Bilirubin Urine: NEGATIVE
Glucose, UA: NEGATIVE mg/dL
Hgb urine dipstick: NEGATIVE
Ketones, ur: NEGATIVE mg/dL
LEUKOCYTES UA: NEGATIVE
NITRITE: NEGATIVE
PH: 7 (ref 5.0–8.0)
Protein, ur: NEGATIVE mg/dL
SPECIFIC GRAVITY, URINE: 1.017 (ref 1.005–1.030)

## 2016-04-11 LAB — C DIFFICILE QUICK SCREEN W PCR REFLEX
C DIFFICILE (CDIFF) INTERP: NOT DETECTED
C DIFFICILE (CDIFF) TOXIN: NEGATIVE
C Diff antigen: NEGATIVE

## 2016-04-11 LAB — BILIRUBIN, FRACTIONATED(TOT/DIR/INDIR)
BILIRUBIN DIRECT: 0.2 mg/dL (ref 0.1–0.5)
Indirect Bilirubin: 0.8 mg/dL (ref 0.3–0.9)
Total Bilirubin: 1 mg/dL (ref 0.3–1.2)

## 2016-04-11 LAB — LIPASE, BLOOD: Lipase: 24 U/L (ref 11–51)

## 2016-04-11 MED ORDER — POTASSIUM CHLORIDE IN NACL 20-0.9 MEQ/L-% IV SOLN
INTRAVENOUS | Status: DC
  Administered 2016-04-11 – 2016-04-12 (×3): via INTRAVENOUS
  Filled 2016-04-11 (×7): qty 1000

## 2016-04-11 MED ORDER — ONDANSETRON HCL 4 MG/2ML IJ SOLN
4.0000 mg | Freq: Once | INTRAMUSCULAR | Status: AC
  Administered 2016-04-11: 4 mg via INTRAVENOUS
  Filled 2016-04-11: qty 2

## 2016-04-11 MED ORDER — DIATRIZOATE MEGLUMINE & SODIUM 66-10 % PO SOLN
90.0000 mL | Freq: Once | ORAL | Status: AC
  Administered 2016-04-11: 90 mL via ORAL
  Filled 2016-04-11 (×2): qty 90

## 2016-04-11 MED ORDER — FAMOTIDINE 200 MG/20ML IV SOLN
10.0000 mg | INTRAVENOUS | Status: DC
  Administered 2016-04-11 – 2016-04-12 (×2): 10 mg via INTRAVENOUS
  Filled 2016-04-11 (×2): qty 1

## 2016-04-11 MED ORDER — ONDANSETRON HCL 4 MG/2ML IJ SOLN
4.0000 mg | Freq: Four times a day (QID) | INTRAMUSCULAR | Status: DC | PRN
  Administered 2016-04-11: 4 mg via INTRAVENOUS
  Filled 2016-04-11: qty 2

## 2016-04-11 MED ORDER — ENOXAPARIN SODIUM 40 MG/0.4ML ~~LOC~~ SOLN
40.0000 mg | SUBCUTANEOUS | Status: DC
  Administered 2016-04-11 – 2016-04-12 (×2): 40 mg via SUBCUTANEOUS
  Filled 2016-04-11 (×2): qty 0.4

## 2016-04-11 MED ORDER — ACETAMINOPHEN 650 MG RE SUPP: 650.0000 mg | Freq: Four times a day (QID) | RECTAL | Status: DC | PRN

## 2016-04-11 MED ORDER — HYDROMORPHONE HCL 1 MG/ML IJ SOLN
1.0000 mg | Freq: Once | INTRAMUSCULAR | Status: AC
  Administered 2016-04-11: 1 mg via INTRAVENOUS
  Filled 2016-04-11: qty 1

## 2016-04-11 MED ORDER — HYDROMORPHONE HCL 1 MG/ML IJ SOLN
1.0000 mg | INTRAMUSCULAR | Status: DC | PRN
Start: 2016-04-11 — End: 2016-04-13

## 2016-04-11 MED ORDER — SODIUM CHLORIDE 0.9 % IV BOLUS (SEPSIS)
1000.0000 mL | Freq: Once | INTRAVENOUS | Status: AC
  Administered 2016-04-11: 1000 mL via INTRAVENOUS

## 2016-04-11 MED ORDER — POTASSIUM CHLORIDE CRYS ER 20 MEQ PO TBCR
20.0000 meq | EXTENDED_RELEASE_TABLET | Freq: Once | ORAL | Status: AC
  Administered 2016-04-11: 20 meq via ORAL
  Filled 2016-04-11: qty 1

## 2016-04-11 MED ORDER — ACETAMINOPHEN 325 MG PO TABS
650.0000 mg | ORAL_TABLET | Freq: Four times a day (QID) | ORAL | Status: DC | PRN
  Administered 2016-04-12: 650 mg via ORAL
  Filled 2016-04-11: qty 2

## 2016-04-11 MED ORDER — PROMETHAZINE HCL 25 MG/ML IJ SOLN
6.2500 mg | Freq: Four times a day (QID) | INTRAMUSCULAR | Status: DC | PRN
  Administered 2016-04-11: 12.5 mg via INTRAVENOUS
  Filled 2016-04-11 (×2): qty 1

## 2016-04-11 MED ORDER — IOPAMIDOL (ISOVUE-300) INJECTION 61%
100.0000 mL | Freq: Once | INTRAVENOUS | Status: AC | PRN
  Administered 2016-04-11: 100 mL via INTRAVENOUS

## 2016-04-11 NOTE — ED Provider Notes (Signed)
CSN: [id number]     Arrival date & time 04/11/16  0031 History  By signing my name below, I, Ephriam Jenkins, attest that this documentation has been prepared under the direction and in the presence of [employer].  Electronically Signed: Ephriam Jenkins, ED Scribe. 04/11/2016. 1:13 AM.   Chief Complaint  Patient presents with  . Abdominal Pain  . Nausea   The history is provided by the patient and the spouse. No language interpreter was used.   Marland KitchenHPI Comments: ADEKUNLE MEECE is a 50 y.o. male with a PMHx of multiple abdominal surgeries, brought in by ambulance, who presents to the Emergency Department complaining of gradually worsening generalized abdominal pain onset this evening. Pt states he arrived home from work around 1600 when the abdominal pain started and gradually worsened throughout the evening. Around 2100 pt reports that he became nauseas and diaphoretic. Pt reports dry heaving but denies vomiting. Pt has had multiple abdominal surgeries, and is concerned that this could be due to post op complication. Pt's wife then decided to take him to the ED. Pt reports that pain got so severe en route to the hospital that they had to pull over and call 911. Pt states his pain reduced to a mild 4/10 after he was given "fluids". Pt denies dysuria, diarrhea.    Past Medical History  Diagnosis Date  . Cough variant asthma 10/12/2007  . Vitamin B 12 deficiency   . Liver lesion   . Personal history of colonic polyps 09/28/2009     ADENOMATOUS POLYP  . Irritable bowel syndrome   . Elevated LFTs   . Esophageal reflux   . Hyperlipidemia   . Schatzki's ring   . Hiatal hernia   . GERD (gastroesophageal reflux disease)   . LATERAL EPICONDYLITIS 03/07/2010    Qualifier: Diagnosis of  By: Elease Hashimoto MD, Bruce     Past Surgical History  Procedure Laterality Date  . Lumbar laminectomy      x2  . Exploration laparotomy and small bowel resection    . Ventral hernia repair    . Leg surgery      left    Family History  Problem Relation Age of Onset  . Colon cancer Neg Hx   . Breast cancer Mother   . Thyroid cancer Mother    Social History  Substance Use Topics  . Smoking status: Never Smoker   . Smokeless tobacco: Never Used  . Alcohol Use: Yes     Comment: occ.    Review of Systems  Gastrointestinal: Positive for nausea and abdominal pain. Negative for vomiting and diarrhea.  Genitourinary: Negative for dysuria.  All other systems reviewed and are negative.     Allergies  Review of patient's allergies indicates no known allergies.  Home Medications   Prior to Admission medications   Medication Sig Start Date End Date Taking? Authorizing Provider  atorvastatin (LIPITOR) 10 MG tablet Take 1 tablet (10 mg total) by mouth daily. 07/30/15   Marin Olp, MD  clonazePAM (KLONOPIN) 0.5 MG tablet TAKE 1 TABLET AT BEDTIME 02/05/16   Marin Olp, MD  cyanocobalamin (,VITAMIN B-12,) 1000 MCG/ML injection INJECT 1 ML EVERY MONTH 08/21/15   Marin Olp, MD  Dexlansoprazole 30 MG capsule Take 1 capsule (30 mg total) by mouth daily. 07/30/15   Marin Olp, MD  doxycycline (VIBRAMYCIN) 100 MG capsule Take 1 capsule (100 mg total) by mouth 2 (two) times daily. 09/05/15   Dorothyann Peng, NP  hydrochlorothiazide (HYDRODIURIL) 25 MG tablet Take 1 tablet (25 mg total) by mouth daily. 12/13/15   Marin Olp, MD  HYDROcodone-homatropine [facility]) 5-1.5 MG/5ML syrup Take 5 mLs by mouth every 8 (eight) hours as needed for cough. 09/05/15   Dorothyann Peng, NP  methylPREDNISolone (MEDROL DOSEPAK) 4 MG TBPK tablet Take as directed 09/05/15   Dorothyann Peng, NP  PATADAY 0.2 % SOLN Place 0.2 drops into both eyes daily. Reported on 09/05/2015 12/07/14   Historical Provider, MD   SpO2 98% Physical Exam  Constitutional: He is oriented to person, place, and time. He appears well-developed and well-nourished.  HENT:  Head: Normocephalic and atraumatic.  Eyes: Conjunctivae and EOM are  normal. Pupils are equal, round, and reactive to light. Right eye exhibits no discharge. Left eye exhibits no discharge. No scleral icterus.  Neck: Normal range of motion. Neck supple. No JVD present.  Cardiovascular: Normal rate, regular rhythm and normal heart sounds.  Exam reveals no gallop and no friction rub.   No murmur heard. Pulmonary/Chest: Effort normal and breath sounds normal. No respiratory distress. He has no wheezes. He has no rales. He exhibits no tenderness.  Abdominal: Soft. He exhibits no distension and no mass. There is tenderness. There is no rebound and no guarding.  Moderate diffuse abdominal tenderness  Musculoskeletal: Normal range of motion. He exhibits no edema or tenderness.  Neurological: He is alert and oriented to person, place, and time.  Skin: Skin is warm and dry.  Psychiatric: He has a normal mood and affect. His behavior is normal. Judgment and thought content normal.  Nursing note and vitals reviewed.   ED Course  Procedures  DIAGNOSTIC STUDIES: Oxygen Saturation is 98% on RA, normal by my interpretation.  COORDINATION OF CARE: 1:05 AM-Will order medication for pain and blood work. Discussed treatment plan with pt at bedside and pt agreed to plan.   Results for orders placed or performed during the hospital encounter of 04/11/16  Comprehensive metabolic panel  Result Value Ref Range   Sodium 142 135 - 145 mmol/L   Potassium 3.3 (L) 3.5 - 5.1 mmol/L   Chloride 107 101 - 111 mmol/L   CO2 28 22 - 32 mmol/L   Glucose, Bld 113 (H) 65 - 99 mg/dL   BUN 15 6 - 20 mg/dL   Creatinine, Ser 1.06 0.61 - 1.24 mg/dL   Calcium 9.2 8.9 - 10.3 mg/dL   Total Protein 7.2 6.5 - 8.1 g/dL   Albumin 4.6 3.5 - 5.0 g/dL   AST 28 15 - 41 U/L   ALT 45 17 - 63 U/L   Alkaline Phosphatase 83 38 - 126 U/L   Total Bilirubin 1.3 (H) 0.3 - 1.2 mg/dL   GFR calc non Af Amer >60 >60 mL/min   GFR calc Af Amer >60 >60 mL/min   Anion gap 7 5 - 15  Lipase, blood  Result Value  Ref Range   Lipase 24 11 - 51 U/L  Urinalysis, Routine w reflex microscopic (not at [facility])  Result Value Ref Range   Color, Urine YELLOW YELLOW   APPearance CLOUDY (A) CLEAR   Specific Gravity, Urine 1.017 1.005 - 1.030   pH 7.0 5.0 - 8.0   Glucose, UA NEGATIVE NEGATIVE mg/dL   Hgb urine dipstick NEGATIVE NEGATIVE   Bilirubin Urine NEGATIVE NEGATIVE   Ketones, ur NEGATIVE NEGATIVE mg/dL   Protein, ur NEGATIVE NEGATIVE mg/dL   Nitrite NEGATIVE NEGATIVE   Leukocytes, UA NEGATIVE NEGATIVE  CBC with Differential/Platelet  Result Value Ref Range   WBC 10.4 4.0 - 10.5 K/uL   RBC 4.52 4.22 - 5.81 MIL/uL   Hemoglobin 13.9 13.0 - 17.0 g/dL   HCT 40.5 39.0 - 52.0 %   MCV 89.6 78.0 - 100.0 fL   MCH 30.8 26.0 - 34.0 pg   MCHC 34.3 30.0 - 36.0 g/dL   RDW 13.2 11.5 - 15.5 %   Platelets 170 150 - 400 K/uL   Neutrophils Relative % 86 %   Neutro Abs 8.9 (H) 1.7 - 7.7 K/uL   Lymphocytes Relative 6 %   Lymphs Abs 0.6 (L) 0.7 - 4.0 K/uL   Monocytes Relative 8 %   Monocytes Absolute 0.9 0.1 - 1.0 K/uL   Eosinophils Relative 0 %   Eosinophils Absolute 0.0 0.0 - 0.7 K/uL   Basophils Relative 0 %   Basophils Absolute 0.0 0.0 - 0.1 K/uL   Ct Abdomen Pelvis W Contrast  04/11/2016  CLINICAL DATA:  Increasing general abdominal pain EXAM: CT ABDOMEN AND PELVIS WITH CONTRAST TECHNIQUE: Multidetector CT imaging of the abdomen and pelvis was performed using the standard protocol following bolus administration of intravenous contrast. CONTRAST:  119mL ISOVUE-300 IOPAMIDOL (ISOVUE-300) INJECTION 61% COMPARISON:  April 18, 2011 FINDINGS: No pulmonary nodules, masses, or focal infiltrates. Rounded nodularity to the right of the esophagus on series 2, image 1 is incompletely characterized measuring 2.2 by 1.4 cm, possibly a paraesophageal lymph node but incompletely characterized. The lung bases are otherwise within normal limits. No free air. There is a small amount of free fluid in the pelvis. Multiple hepatic  hemangiomas are again identified with the largest in the posterior right hepatic lobe. No suspicious hepatic masses. The portal vein is normal. Cholelithiasis is seen without wall thickening or pericholecystic fluid. The spleen, adrenal glands, right kidney, and pancreas are normal. There is a cyst in the left kidney which is otherwise normal. The abdominal aorta is normal in caliber with mild atherosclerotic change. No adenopathy. The stomach is distended. Multiple loops of small bowel are dilated out of proportion of the colon consistent with a partial or early small bowel obstruction. The transition between fluid-filled mildly dilated loops of bowel and ileum containing fecal material appears to be in the anterior mid to lower abdomen just above the level of the umbilicus and just inferior to the hernia mesh. The actual cause for transition is not seen. The normal caliber distal ileum containing fecal material extends all the way to the cecum but is most prominent just before the anastomosis in the right lower quadrant. The colon is normal. The visualized appendix is normal as well with no evidence of appendicitis. There is free fluid in the right pericolic gutter and pelvis. The distal appendix lies within this free fluid but the fluid is thought to be due to the bowel obstruction rather than an appendiceal abnormality. The pelvis demonstrates no adenopathy or mass. The bladder is normal. The prostate seminal vesicles are unremarkable. Visualized bones are normal. Delayed images demonstrate no filling defects in the upper renal collecting cysts. IMPRESSION: 1. Partial or early small bowel obstruction. The transition point appears to be anterior in the abdomen between the umbilicus and the inferior edge of the hernia mesh. The actual cause for obstruction is not discerned. 2. Rounded nodularity to the right of the esophagus is not completely evaluated. This could represent a prominent lymph node. CT imaging  through this region could further evaluate. 3. Cholelithiasis. 4. Atherosclerosis in the aorta. Electronically Signed  By: Dorise Bullion III M.D   On: 04/11/2016 03:31      MDM   Final diagnoses:  Small bowel obstruction [facility])    Patient with severe abdominal pain which is been worsening over the past few days. Reports associated vomiting.   Will check CT and labs.  CT scan is concerning for early or partial small bowel obstruction.  Patient discussed with Dr. Tyrone Nine.  Appreciate general surgery (Dr. Redmond Pulling) for telephone consultation, will see patient in the morning.  Appreciate Dr. Loleta Books for admitting the patient.  I personally performed the services described in this documentation, which was scribed in my presence. The recorded information has been reviewed and is accurate.      Montine Circle, PA-C 04/11/16 [place], DO 04/11/16 0502

## 2016-04-11 NOTE — Progress Notes (Signed)
PROGRESS NOTE        PATIENT DETAILS Name: Bradley [name] Age: 50 y.o. Sex: male Date of Birth: 22-Jan-1966 Admit Date: 04/11/2016 Admitting Physician Edwin Dada, MD [id number] Yong Channel, MD  Brief Narrative: Patient is a 50 y.o. male with prior history of laparotomy admitted with a partial small bowel obstruction.  Subjective: Abdominal pain improved, no further vomiting. Had small BM early this morning.  Assessment/Plan: Principal Problem: Partial SBO (small bowel obstruction): Resolving, general surgery following and directing care.  Active Problems: Hypokalemia: Secondary to GI loss, replete and recheck.  GERD: Continue Pepcid  Hypertension: Blood pressure currently controlled without the use of any antihypertensives. Resume when able  Incidental finding of paraesophageal nodule on CT: Suspect should be stable for outpatient follow-up by PCP.  Dyslipidemia: Resume Lipitor when diet is more stable.  DVT Prophylaxis: Prophylactic Lovenox   Code Status: Full code   Family Communication: Spouse at bedside  Disposition Plan: Remain inpatient-suspect home over the weekend  Antimicrobial agents: None  Procedures: None  CONSULTS:  general surgery  Time spent: 25 minutes-Greater than 50% of this time was spent in counseling, explanation of diagnosis, planning of further management, and coordination of care.  MEDICATIONS: Anti-infectives    None      Scheduled Meds: . enoxaparin (LOVENOX) injection  40 mg Subcutaneous Q24H  . famotidine (PEPCID) IV  10 mg Intravenous Q24H   Continuous Infusions: . 0.9 % NaCl with KCl 20 mEq / L 125 mL/hr at 04/11/16 0547   PRN Meds:.acetaminophen **OR** acetaminophen, HYDROmorphone (DILAUDID) injection, ondansetron (ZOFRAN) IV, promethazine   PHYSICAL EXAM: Vital signs: Filed Vitals:   04/11/16 0032 04/11/16 0109 04/11/16 0409 04/11/16 0535  BP:  118/77 125/80   Pulse:  73 89     Temp:   98.9 F (37.2 C)   TempSrc:   Oral   Resp:  18 16   Height:    5\' 11"  (1.803 m)  Weight:    89.2 kg (196 lb 10.4 oz)  SpO2: 98% 96% 94%    Filed Weights   04/11/16 0535  Weight: 89.2 kg (196 lb 10.4 oz)   Body mass index is 27.44 kg/(m^2).   Gen Exam: Awake and alert with clear speech. Not in any distress Neck: Supple, No JVD.   Chest: B/L Clear.   CVS: S1 S2 Regular, no murmurs.  Abdomen: soft, BS Sluggish, non tender, non distended.  Extremities: no edema, lower extremities warm to touch. Neurologic: Non Focal.   Skin: No Rash or lesions   Wounds: N/A.  LABORATORY DATA: CBC:  Recent Labs Lab 04/11/16 0215  WBC 10.4  NEUTROABS 8.9*  HGB 13.9  HCT 40.5  MCV 89.6  PLT 123XX123    Basic Metabolic Panel:  Recent Labs Lab 04/11/16 0101  NA 142  K 3.3*  CL 107  CO2 28  GLUCOSE 113*  BUN 15  CREATININE 1.06  CALCIUM 9.2    GFR: Estimated Creatinine Clearance: 87.8 mL/min (by C-G formula based on Cr of 1.06).  Liver Function Tests:  Recent Labs Lab 04/11/16 0101 04/11/16 0536  AST 28  --   ALT 45  --   ALKPHOS 83  --   BILITOT 1.3* 1.0  PROT 7.2  --   ALBUMIN 4.6  --     Recent Labs Lab 04/11/16 0101  LIPASE 24  No results for input(s): AMMONIA in the last 168 hours.  Coagulation Profile: No results for input(s): INR, PROTIME in the last 168 hours.  Cardiac Enzymes: No results for input(s): CKTOTAL, CKMB, CKMBINDEX, TROPONINI in the last 168 hours.  BNP (last 3 results) No results for input(s): PROBNP in the last 8760 hours.  HbA1C: No results for input(s): HGBA1C in the last 72 hours.  CBG: No results for input(s): GLUCAP in the last 168 hours.  Lipid Profile: No results for input(s): CHOL, HDL, LDLCALC, TRIG, CHOLHDL, LDLDIRECT in the last 72 hours.  Thyroid Function Tests: No results for input(s): TSH, T4TOTAL, FREET4, T3FREE, THYROIDAB in the last 72 hours.  Anemia Panel: No results for input(s): VITAMINB12, FOLATE,  FERRITIN, TIBC, IRON, RETICCTPCT in the last 72 hours.  Urine analysis:    Component Value Date/Time   COLORURINE YELLOW 04/11/2016 0116   APPEARANCEUR CLOUDY* 04/11/2016 0116   LABSPEC 1.017 04/11/2016 0116   PHURINE 7.0 04/11/2016 0116   GLUCOSEU NEGATIVE 04/11/2016 0116   GLUCOSEU NEGATIVE 03/01/2012 0910   HGBUR NEGATIVE 04/11/2016 0116   HGBUR negative 08/18/2008 1004   BILIRUBINUR NEGATIVE 04/11/2016 0116   BILIRUBINUR n 11/22/2013 1033   KETONESUR NEGATIVE 04/11/2016 0116   PROTEINUR NEGATIVE 04/11/2016 0116   PROTEINUR n 11/22/2013 1033   UROBILINOGEN 0.2 11/22/2013 1033   UROBILINOGEN 0.2 03/01/2012 0910   NITRITE NEGATIVE 04/11/2016 0116   NITRITE n 11/22/2013 1033   LEUKOCYTESUR NEGATIVE 04/11/2016 0116    Sepsis Labs: Lactic Acid, Venous    Component Value Date/Time   LATICACIDVEN 2.7* 04/18/2011 1424    MICROBIOLOGY: No results found for this or any previous visit (from the past 240 hour(s)).  RADIOLOGY STUDIES/RESULTS: Ct Abdomen Pelvis W Contrast  04/11/2016  CLINICAL DATA:  Increasing general abdominal pain EXAM: CT ABDOMEN AND PELVIS WITH CONTRAST TECHNIQUE: Multidetector CT imaging of the abdomen and pelvis was performed using the standard protocol following bolus administration of intravenous contrast. CONTRAST:  166mL ISOVUE-300 IOPAMIDOL (ISOVUE-300) INJECTION 61% COMPARISON:  April 18, 2011 FINDINGS: No pulmonary nodules, masses, or focal infiltrates. Rounded nodularity to the right of the esophagus on series 2, image 1 is incompletely characterized measuring 2.2 by 1.4 cm, possibly a paraesophageal lymph node but incompletely characterized. The lung bases are otherwise within normal limits. No free air. There is a small amount of free fluid in the pelvis. Multiple hepatic hemangiomas are again identified with the largest in the posterior right hepatic lobe. No suspicious hepatic masses. The portal vein is normal. Cholelithiasis is seen without wall  thickening or pericholecystic fluid. The spleen, adrenal glands, right kidney, and pancreas are normal. There is a cyst in the left kidney which is otherwise normal. The abdominal aorta is normal in caliber with mild atherosclerotic change. No adenopathy. The stomach is distended. Multiple loops of small bowel are dilated out of proportion of the colon consistent with a partial or early small bowel obstruction. The transition between fluid-filled mildly dilated loops of bowel and ileum containing fecal material appears to be in the anterior mid to lower abdomen just above the level of the umbilicus and just inferior to the hernia mesh. The actual cause for transition is not seen. The normal caliber distal ileum containing fecal material extends all the way to the cecum but is most prominent just before the anastomosis in the right lower quadrant. The colon is normal. The visualized appendix is normal as well with no evidence of appendicitis. There is free fluid in the right pericolic  gutter and pelvis. The distal appendix lies within this free fluid but the fluid is thought to be due to the bowel obstruction rather than an appendiceal abnormality. The pelvis demonstrates no adenopathy or mass. The bladder is normal. The prostate seminal vesicles are unremarkable. Visualized bones are normal. Delayed images demonstrate no filling defects in the upper renal collecting cysts. IMPRESSION: 1. Partial or early small bowel obstruction. The transition point appears to be anterior in the abdomen between the umbilicus and the inferior edge of the hernia mesh. The actual cause for obstruction is not discerned. 2. Rounded nodularity to the right of the esophagus is not completely evaluated. This could represent a prominent lymph node. CT imaging through this region could further evaluate. 3. Cholelithiasis. 4. Atherosclerosis in the aorta. Electronically Signed   By: Dorise Bullion III M.D   On: 04/11/2016 03:31     LOS: 0  days   Oren Binet, MD  [facility] Pager:[phone]  If 7PM-7AM, please contact night-coverage [email or web address] Password [facility] 04/11/2016, 12:55 PM

## 2016-04-11 NOTE — ED Notes (Signed)
Bed: WA25 Expected date:  Expected time:  Means of arrival:  Comments: EMS epigastric pain 
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
## 2016-04-11 NOTE — ED Notes (Signed)
Per EMS pt. From home with complaint of abdominal pain with nausea which started early evening yesterday. Denies diarrhea Pt. Stated that he was outside whole day and felt like he was dehydrated.  Received 718ml of NS on board EMS. Alert and oriented x3.

## 2016-04-11 NOTE — Consult Note (Signed)
Reason for Consult: No pain, nausea, dry heaves Referring Physician: Dr. Nena Alexander  Bradley [name] is an 50 y.o. male.  HPI: Patient presents to the ED with the above noted complaints. He started with abdominal pain followed by nausea. He reports loose stools/ diarrhea for about 2 weeks. Because of the increasing abdominal pain he was coming to the Ed last PM.He did spend the day working outside yesterday and had a syncopal episode en route to the ED last PM.    EMS was called, he was dehydrated, reported hypotensive, and received normal saline bolus by EMS. He has a history of trauma with exploratory laparotomy and bowel resection, 2010. He has also had 2 prior ventral hernia repairs, following his laparotomy with bowel resection.  The last surgery was 2012.  Workup in the ED shows: He is afebrile vital signs are normal. Potassium was 3.3, glucose 113 labs are otherwise normal. Urinalysis was normal. CT scan shows: Partial or early small bowel obstruction transition point appears to be in the anterior of the abdomen between the umbilicus and the inferior edge of the hernia mesh. Actual cause for obstruction was not discerned. There is a rounded nodule to the right of the esophagus he has cholelithiasis with some atherosclerosis in his aorta. He was seen by medicine and admitted. Tentative of diagnosis of partial small bowel obstruction. He was not vomiting soon NG tube was not placed. Last BM was yesterday and was loose, last meal  yesterday at lunch, Barbeque.   We are asked to see.  Past Medical History  Diagnosis Date  . Cough variant asthma 10/12/2007  . Vitamin B 12 deficiency   . Liver lesion   . Personal history of colonic polyps 09/28/2009     ADENOMATOUS POLYP  . Irritable bowel syndrome   . Elevated LFTs   . Esophageal reflux   . Hyperlipidemia   . Schatzki's ring   . Hiatal hernia   . GERD (gastroesophageal reflux disease)   . LATERAL EPICONDYLITIS 03/07/2010    Qualifier: Diagnosis of   By: Elease Hashimoto MD, Bruce      Past Surgical History  Procedure Laterality Date  . Lumbar laminectomy      x2  . Exploration laparotomy and small bowel resection    . Ventral hernia repair    . Leg surgery      left    Family History  Problem Relation Age of Onset  . Colon cancer Neg Hx   . Breast cancer Mother   . Thyroid cancer Mother     Social History:  reports that he has never smoked. He has never used smokeless tobacco. He reports that he drinks alcohol. He reports that he does not use illicit drugs.  Allergies: No Known Allergies  Medications:  Prior to Admission:  Prescriptions prior to admission  Medication Sig Dispense Refill Last Dose  . Dexlansoprazole 30 MG capsule Take 1 capsule (30 mg total) by mouth daily. 90 capsule 3 04/10/2016 at Unknown time  . tiZANidine (ZANAFLEX) 2 MG tablet Take 2 mg by mouth every 6 (six) hours as needed.  0 04/10/2016 at Unknown time    Results for orders placed or performed during the hospital encounter of 04/11/16 (from the past 48 hour(s))  Comprehensive metabolic panel     Status: Abnormal   Collection Time: 04/11/16  1:01 AM  Result Value Ref Range   Sodium 142 135 - 145 mmol/L   Potassium 3.3 (L) 3.5 - 5.1 mmol/L  Chloride 107 101 - 111 mmol/L   CO2 28 22 - 32 mmol/L   Glucose, Bld 113 (H) 65 - 99 mg/dL   BUN 15 6 - 20 mg/dL   Creatinine, Ser 1.06 0.61 - 1.24 mg/dL   Calcium 9.2 8.9 - 10.3 mg/dL   Total Protein 7.2 6.5 - 8.1 g/dL   Albumin 4.6 3.5 - 5.0 g/dL   AST 28 15 - 41 U/L   ALT 45 17 - 63 U/L   Alkaline Phosphatase 83 38 - 126 U/L   Total Bilirubin 1.3 (H) 0.3 - 1.2 mg/dL   GFR calc non Af Amer >60 >60 mL/min   GFR calc Af Amer >60 >60 mL/min    Comment: (NOTE) The eGFR has been calculated using the CKD EPI equation. This calculation has not been validated in all clinical situations. eGFR's persistently <60 mL/min signify possible Chronic Kidney Disease.    Anion gap 7 5 - 15  Lipase, blood     Status:  None   Collection Time: 04/11/16  1:01 AM  Result Value Ref Range   Lipase 24 11 - 51 U/L  Urinalysis, Routine w reflex microscopic (not at [facility])     Status: Abnormal   Collection Time: 04/11/16  1:16 AM  Result Value Ref Range   Color, Urine YELLOW YELLOW   APPearance CLOUDY (A) CLEAR   Specific Gravity, Urine 1.017 1.005 - 1.030   pH 7.0 5.0 - 8.0   Glucose, UA NEGATIVE NEGATIVE mg/dL   Hgb urine dipstick NEGATIVE NEGATIVE   Bilirubin Urine NEGATIVE NEGATIVE   Ketones, ur NEGATIVE NEGATIVE mg/dL   Protein, ur NEGATIVE NEGATIVE mg/dL   Nitrite NEGATIVE NEGATIVE   Leukocytes, UA NEGATIVE NEGATIVE    Comment: MICROSCOPIC NOT DONE ON URINES WITH NEGATIVE PROTEIN, BLOOD, LEUKOCYTES, NITRITE, OR GLUCOSE <1000 mg/dL.  CBC with Differential/Platelet     Status: Abnormal   Collection Time: 04/11/16  2:15 AM  Result Value Ref Range   WBC 10.4 4.0 - 10.5 K/uL   RBC 4.52 4.22 - 5.81 MIL/uL   Hemoglobin 13.9 13.0 - 17.0 g/dL   HCT 40.5 39.0 - 52.0 %   MCV 89.6 78.0 - 100.0 fL   MCH 30.8 26.0 - 34.0 pg   MCHC 34.3 30.0 - 36.0 g/dL   RDW 13.2 11.5 - 15.5 %   Platelets 170 150 - 400 K/uL   Neutrophils Relative % 86 %   Neutro Abs 8.9 (H) 1.7 - 7.7 K/uL   Lymphocytes Relative 6 %   Lymphs Abs 0.6 (L) 0.7 - 4.0 K/uL   Monocytes Relative 8 %   Monocytes Absolute 0.9 0.1 - 1.0 K/uL   Eosinophils Relative 0 %   Eosinophils Absolute 0.0 0.0 - 0.7 K/uL   Basophils Relative 0 %   Basophils Absolute 0.0 0.0 - 0.1 K/uL  Bilirubin, fractionated(tot/dir/indir)     Status: None   Collection Time: 04/11/16  5:36 AM  Result Value Ref Range   Total Bilirubin 1.0 0.3 - 1.2 mg/dL   Bilirubin, Direct 0.2 0.1 - 0.5 mg/dL   Indirect Bilirubin 0.8 0.3 - 0.9 mg/dL    Ct Abdomen Pelvis W Contrast  04/11/2016  CLINICAL DATA:  Increasing general abdominal pain EXAM: CT ABDOMEN AND PELVIS WITH CONTRAST TECHNIQUE: Multidetector CT imaging of the abdomen and pelvis was performed using the standard protocol  following bolus administration of intravenous contrast. CONTRAST:  149m ISOVUE-300 IOPAMIDOL (ISOVUE-300) INJECTION 61% COMPARISON:  April 18, 2011 FINDINGS: No pulmonary nodules,  masses, or focal infiltrates. Rounded nodularity to the right of the esophagus on series 2, image 1 is incompletely characterized measuring 2.2 by 1.4 cm, possibly a paraesophageal lymph node but incompletely characterized. The lung bases are otherwise within normal limits. No free air. There is a small amount of free fluid in the pelvis. Multiple hepatic hemangiomas are again identified with the largest in the posterior right hepatic lobe. No suspicious hepatic masses. The portal vein is normal. Cholelithiasis is seen without wall thickening or pericholecystic fluid. The spleen, adrenal glands, right kidney, and pancreas are normal. There is a cyst in the left kidney which is otherwise normal. The abdominal aorta is normal in caliber with mild atherosclerotic change. No adenopathy. The stomach is distended. Multiple loops of small bowel are dilated out of proportion of the colon consistent with a partial or early small bowel obstruction. The transition between fluid-filled mildly dilated loops of bowel and ileum containing fecal material appears to be in the anterior mid to lower abdomen just above the level of the umbilicus and just inferior to the hernia mesh. The actual cause for transition is not seen. The normal caliber distal ileum containing fecal material extends all the way to the cecum but is most prominent just before the anastomosis in the right lower quadrant. The colon is normal. The visualized appendix is normal as well with no evidence of appendicitis. There is free fluid in the right pericolic gutter and pelvis. The distal appendix lies within this free fluid but the fluid is thought to be due to the bowel obstruction rather than an appendiceal abnormality. The pelvis demonstrates no adenopathy or mass. The bladder is  normal. The prostate seminal vesicles are unremarkable. Visualized bones are normal. Delayed images demonstrate no filling defects in the upper renal collecting cysts. IMPRESSION: 1. Partial or early small bowel obstruction. The transition point appears to be anterior in the abdomen between the umbilicus and the inferior edge of the hernia mesh. The actual cause for obstruction is not discerned. 2. Rounded nodularity to the right of the esophagus is not completely evaluated. This could represent a prominent lymph node. CT imaging through this region could further evaluate. 3. Cholelithiasis. 4. Atherosclerosis in the aorta. Electronically Signed   By: Dorise Bullion III M.D   On: 04/11/2016 03:31    Review of Systems  Constitutional: Negative.        Syncopal episode last PM going to ED  HENT: Negative.   Eyes: Negative.   Respiratory: Negative.   Cardiovascular: Negative.   Gastrointestinal: Positive for heartburn (chronic issue), nausea, abdominal pain and diarrhea. Negative for constipation, blood in stool and melena.       2 weeks of loose stools, no vomiting, but dry heaves.  Genitourinary: Negative.   Musculoskeletal:       Back surgery (disc) 2 weeks ago.  Skin: Negative.   Neurological: Negative.   Endo/Heme/Allergies: Negative.   Psychiatric/Behavioral: Negative.    Blood pressure 125/80, pulse 89, temperature 98.9 F (37.2 C), temperature source Oral, resp. rate 16, height 5' 11"  (1.803 m), weight 89.2 kg (196 lb 10.4 oz), SpO2 94 %. Physical Exam  Constitutional: He is oriented to person, place, and time. He appears well-developed and well-nourished. No distress.  HENT:  Head: Normocephalic and atraumatic.  Nose: Nose normal.  Eyes: Right eye exhibits no discharge. Left eye exhibits no discharge. No scleral icterus.  Neck: Normal range of motion. Neck supple. No JVD present. No tracheal deviation present. No thyromegaly  present.  Cardiovascular: Normal rate, regular rhythm,  normal heart sounds and intact distal pulses.   No murmur heard. Respiratory: Effort normal and breath sounds normal. No respiratory distress. He has no wheezes. He has no rales. He exhibits no tenderness.  GI: Soft. He exhibits no distension and no mass. There is no tenderness. There is no rebound and no guarding.  Few hypoactive BS Midline surgical scar, well healed.  Musculoskeletal: He exhibits no edema or tenderness.  Lymphadenopathy:    He has no cervical adenopathy.  Neurological: He is alert and oriented to person, place, and time. No cranial nerve deficit.  Skin: Skin is warm and dry. No rash noted. He is not diaphoretic. No erythema. No pallor.  Psychiatric: He has a normal mood and affect. His behavior is normal. Judgment and thought content normal.    Assessment/Plan: SBO/PSBO Prior exploratory laparotomy/SBR after trauma, Ventral hernia repair x 2 Hiatal hernia with GERD Recent back surgery 03/03/16 Dr. Donald Pore  Plan: he is not distended or tender this AM, some ongoing nausea.  We plan to continue bowel rest, IV hydration and SB protocol, but we will have him take it orally.  We will follow with you.      Letita Prentiss 04/11/2016, 7:40 AM

## 2016-04-12 LAB — COMPREHENSIVE METABOLIC PANEL
ALT: 39 U/L (ref 17–63)
ANION GAP: 5 (ref 5–15)
AST: 24 U/L (ref 15–41)
Albumin: 4 g/dL (ref 3.5–5.0)
Alkaline Phosphatase: 78 U/L (ref 38–126)
BUN: 12 mg/dL (ref 6–20)
CHLORIDE: 111 mmol/L (ref 101–111)
CO2: 27 mmol/L (ref 22–32)
CREATININE: 0.94 mg/dL (ref 0.61–1.24)
Calcium: 8.8 mg/dL — ABNORMAL LOW (ref 8.9–10.3)
Glucose, Bld: 92 mg/dL (ref 65–99)
POTASSIUM: 4.1 mmol/L (ref 3.5–5.1)
Sodium: 143 mmol/L (ref 135–145)
Total Bilirubin: 1.2 mg/dL (ref 0.3–1.2)
Total Protein: 6.6 g/dL (ref 6.5–8.1)

## 2016-04-12 LAB — GASTROINTESTINAL PANEL BY PCR, STOOL (REPLACES STOOL CULTURE)
ASTROVIRUS: NOT DETECTED
Adenovirus F40/41: NOT DETECTED
CYCLOSPORA CAYETANENSIS: NOT DETECTED
Campylobacter species: NOT DETECTED
Cryptosporidium: NOT DETECTED
E. COLI O157: NOT DETECTED
ENTAMOEBA HISTOLYTICA: NOT DETECTED
ENTEROPATHOGENIC E COLI (EPEC): NOT DETECTED
ENTEROTOXIGENIC E COLI (ETEC): NOT DETECTED
Enteroaggregative E coli (EAEC): NOT DETECTED
Giardia lamblia: NOT DETECTED
Norovirus GI/GII: NOT DETECTED
Plesimonas shigelloides: NOT DETECTED
Rotavirus A: NOT DETECTED
SAPOVIRUS (I, II, IV, AND V): NOT DETECTED
SHIGA LIKE TOXIN PRODUCING E COLI (STEC): NOT DETECTED
Salmonella species: NOT DETECTED
Shigella/Enteroinvasive E coli (EIEC): NOT DETECTED
VIBRIO CHOLERAE: NOT DETECTED
VIBRIO SPECIES: NOT DETECTED
Yersinia enterocolitica: NOT DETECTED

## 2016-04-12 LAB — CBC
HCT: 41.2 % (ref 39.0–52.0)
Hemoglobin: 13.8 g/dL (ref 13.0–17.0)
MCH: 30.7 pg (ref 26.0–34.0)
MCHC: 33.5 g/dL (ref 30.0–36.0)
MCV: 91.6 fL (ref 78.0–100.0)
PLATELETS: 174 10*3/uL (ref 150–400)
RBC: 4.5 MIL/uL (ref 4.22–5.81)
RDW: 13.3 % (ref 11.5–15.5)
WBC: 5.7 10*3/uL (ref 4.0–10.5)

## 2016-04-12 MED ORDER — POTASSIUM CHLORIDE IN NACL 20-0.9 MEQ/L-% IV SOLN
INTRAVENOUS | Status: AC
  Administered 2016-04-12: 18:00:00 via INTRAVENOUS
  Filled 2016-04-12: qty 1000

## 2016-04-12 NOTE — Progress Notes (Signed)
Subjective: Feels good. Having lots of bm'Bradley [name] now  Objective: Vital signs in last 24 hours: Temp:  [97.6 F (36.4 C)-97.7 F (36.5 C)] 97.6 F (36.4 C) (07/22 0620) Pulse Rate:  [74-79] 79 (07/22 0620) Resp:  [17] 17 (07/22 0620) BP: (127-128)/(73-85) 128/85 mmHg (07/22 0620) SpO2:  [97 %] 97 % (07/22 0620) Last BM Date: 04/11/16  Intake/Output from previous day: 07/21 0701 - 07/22 0700 In: 1575 [I.V.:1550; IV Piggyback:25] Out: -  Intake/Output this shift:    Resp: clear to auscultation bilaterally Cardio: regular rate and rhythm GI: soft, flat, nontender  Lab Results:   Recent Labs  04/11/16 0215 04/12/16 0623  WBC 10.4 5.7  HGB 13.9 13.8  HCT 40.5 41.2  PLT 170 174   BMET  Recent Labs  04/11/16 0101 04/12/16 0623  NA 142 143  K 3.3* 4.1  CL 107 111  CO2 28 27  GLUCOSE 113* 92  BUN 15 12  CREATININE 1.06 0.94  CALCIUM 9.2 8.8*   PT/INR No results for input(Bradley [name]): LABPROT, INR in the last 72 hours. ABG No results for input(Bradley [name]): PHART, HCO3 in the last 72 hours.  Invalid input(Bradley [name]): PCO2, PO2  Studies/Results: Ct Abdomen Pelvis W Contrast  04/11/2016  CLINICAL DATA:  Increasing general abdominal pain EXAM: CT ABDOMEN AND PELVIS WITH CONTRAST TECHNIQUE: Multidetector CT imaging of the abdomen and pelvis was performed using the standard protocol following bolus administration of intravenous contrast. CONTRAST:  157mL ISOVUE-300 IOPAMIDOL (ISOVUE-300) INJECTION 61% COMPARISON:  April 18, 2011 FINDINGS: No pulmonary nodules, masses, or focal infiltrates. Rounded nodularity to the right of the esophagus on series 2, image 1 is incompletely characterized measuring 2.2 by 1.4 cm, possibly a paraesophageal lymph node but incompletely characterized. The lung bases are otherwise within normal limits. No free air. There is a small amount of free fluid in the pelvis. Multiple hepatic hemangiomas are again identified with the largest in the posterior right hepatic lobe. No  suspicious hepatic masses. The portal vein is normal. Cholelithiasis is seen without wall thickening or pericholecystic fluid. The spleen, adrenal glands, right kidney, and pancreas are normal. There is a cyst in the left kidney which is otherwise normal. The abdominal aorta is normal in caliber with mild atherosclerotic change. No adenopathy. The stomach is distended. Multiple loops of small bowel are dilated out of proportion of the colon consistent with a partial or early small bowel obstruction. The transition between fluid-filled mildly dilated loops of bowel and ileum containing fecal material appears to be in the anterior mid to lower abdomen just above the level of the umbilicus and just inferior to the hernia mesh. The actual cause for transition is not seen. The normal caliber distal ileum containing fecal material extends all the way to the cecum but is most prominent just before the anastomosis in the right lower quadrant. The colon is normal. The visualized appendix is normal as well with no evidence of appendicitis. There is free fluid in the right pericolic gutter and pelvis. The distal appendix lies within this free fluid but the fluid is thought to be due to the bowel obstruction rather than an appendiceal abnormality. The pelvis demonstrates no adenopathy or mass. The bladder is normal. The prostate seminal vesicles are unremarkable. Visualized bones are normal. Delayed images demonstrate no filling defects in the upper renal collecting cysts. IMPRESSION: 1. Partial or early small bowel obstruction. The transition point appears to be anterior in the abdomen between the umbilicus and the inferior edge of the  hernia mesh. The actual cause for obstruction is not discerned. 2. Rounded nodularity to the right of the esophagus is not completely evaluated. This could represent a prominent lymph node. CT imaging through this region could further evaluate. 3. Cholelithiasis. 4. Atherosclerosis in the aorta.  Electronically Signed   By: Dorise Bullion Bradley [name] M.D   On: 04/11/2016 03:31   Dg Abd Portable 1v-small Bowel Obstruction Protocol-initial, 8 Hr Delay  04/11/2016  CLINICAL DATA:  Small-bowel obstruction protocol. 8 hour delayed film. History of colon polyps, IBS, hiatal hernia, exploration laparotomy and small murmur section, ventral hernia repair and GERD. EXAM: PORTABLE ABDOMEN - 1 VIEW COMPARISON:  None. FINDINGS: Oral contrast is seen within the distal small bowel and throughout the colon thereby excluding complete small bowel obstruction. No dilated small bowel loops identified. Surgical changes of anterior abdominal wall hernia repair. Osseous structures are unremarkable. IMPRESSION: Oral contrast now seen within the distal small bowel and throughout the large bowel thereby excluding a complete small bowel obstruction. No dilated bowel loops identified. Electronically Signed   By: Franki Cabot M.D.   On: 04/11/2016 21:16    Anti-infectives: Anti-infectives    None      Assessment/Plan: Bradley [name]/p * No surgery found * Advance diet  Ileus vs sbo seems to have resolved. If he tolerates diet then we will sign off  LOS: 1 day    Bradley Bradley [name],Bradley Bradley [name] 04/12/2016

## 2016-04-12 NOTE — Progress Notes (Signed)
PROGRESS NOTE        PATIENT DETAILS Name: Bradley [name] Age: 50 y.o. Sex: male Date of Birth: 11-16-1965 Admit Date: 04/11/2016 Admitting Physician Edwin Dada, MD [id number] Yong Channel, MD  Brief Narrative: Patient is a 50 y.o. male with prior history of laparotomy admitted with a partial small bowel obstruction.  Subjective: Abdominal pain improved, no further vomiting. Had small BM early this morning.  Assessment/Plan:   Partial SBO (small bowel obstruction): Resolving, general surgery following and directing care. To advance diet today, if tolerates DC home in am.  Hypokalemia: Secondary to GI loss, repleted and stable.  GERD: Continue Pepcid  Hypertension: Blood pressure currently controlled without the use of any antihypertensives. Resume when able  Incidental finding of paraesophageal nodule on CT: Stable for outpatient follow-up by PCP.  Dyslipidemia: Resume Lipitor when diet is more stable.    DVT Prophylaxis: Prophylactic Lovenox   Code Status: Full code   Family Communication: Spouse at bedside  Disposition Plan: Remain inpatient-suspect home over the weekend  Antimicrobial agents: None  Procedures: None  CONSULTS:  general surgery  Time spent: 25 minutes-Greater than 50% of this time was spent in counseling, explanation of diagnosis, planning of further management, and coordination of care.  MEDICATIONS: Anti-infectives    None      Scheduled Meds: . enoxaparin (LOVENOX) injection  40 mg Subcutaneous Q24H  . famotidine (PEPCID) IV  10 mg Intravenous Q24H   Continuous Infusions: . 0.9 % NaCl with KCl 20 mEq / L 125 mL/hr at 04/12/16 0019   PRN Meds:.acetaminophen **OR** acetaminophen, HYDROmorphone (DILAUDID) injection, ondansetron (ZOFRAN) IV, promethazine   PHYSICAL EXAM: Vital signs: Filed Vitals:   04/11/16 0409 04/11/16 0535 04/11/16 2045 04/12/16 0620  BP: 125/80  127/73 128/85  Pulse: 89   74 79  Temp: 98.9 F (37.2 C)  97.7 F (36.5 C) 97.6 F (36.4 C)  TempSrc: Oral  Oral Oral  Resp: 16  17 17   Height:  5\' 11"  (1.803 m)    Weight:  89.2 kg (196 lb 10.4 oz)    SpO2: 94%  97% 97%   Filed Weights   04/11/16 0535  Weight: 89.2 kg (196 lb 10.4 oz)   Body mass index is 27.44 kg/(m^2).   Gen Exam: Awake and alert with clear speech. Not in any distress Neck: Supple, No JVD.   Chest: B/L Clear.   CVS: S1 S2 Regular, no murmurs.  Abdomen: soft, BS Sluggish, non tender, non distended.  Extremities: no edema, lower extremities warm to touch. Neurologic: Non Focal.   Skin: No Rash or lesions   Wounds: N/A.  LABORATORY DATA: CBC:  Recent Labs Lab 04/11/16 0215 04/12/16 0623  WBC 10.4 5.7  NEUTROABS 8.9*  --   HGB 13.9 13.8  HCT 40.5 41.2  MCV 89.6 91.6  PLT 170 [id number]    Basic Metabolic Panel:  Recent Labs Lab 04/11/16 0101 04/12/16 0623  NA 142 143  K 3.3* 4.1  CL 107 111  CO2 28 27  GLUCOSE 113* 92  BUN 15 12  CREATININE 1.06 0.94  CALCIUM 9.2 8.8*    GFR: Estimated Creatinine Clearance: 99 mL/min (by C-G formula based on Cr of 0.94).  Liver Function Tests:  Recent Labs Lab 04/11/16 0101 04/11/16 0536 04/12/16 0623  AST 28  --  24  ALT 45  --  39  ALKPHOS 83  --  78  BILITOT 1.3* 1.0 1.2  PROT 7.2  --  6.6  ALBUMIN 4.6  --  4.0    Recent Labs Lab 04/11/16 0101  LIPASE 24   No results for input(s): AMMONIA in the last 168 hours.  Coagulation Profile: No results for input(s): INR, PROTIME in the last 168 hours.  Cardiac Enzymes: No results for input(s): CKTOTAL, CKMB, CKMBINDEX, TROPONINI in the last 168 hours.  BNP (last 3 results) No results for input(s): PROBNP in the last 8760 hours.  HbA1C: No results for input(s): HGBA1C in the last 72 hours.  CBG: No results for input(s): GLUCAP in the last 168 hours.  Lipid Profile: No results for input(s): CHOL, HDL, LDLCALC, TRIG, CHOLHDL, LDLDIRECT in the last 72  hours.  Thyroid Function Tests: No results for input(s): TSH, T4TOTAL, FREET4, T3FREE, THYROIDAB in the last 72 hours.  Anemia Panel: No results for input(s): VITAMINB12, FOLATE, FERRITIN, TIBC, IRON, RETICCTPCT in the last 72 hours.  Urine analysis:    Component Value Date/Time   COLORURINE YELLOW 04/11/2016 0116   APPEARANCEUR CLOUDY* 04/11/2016 0116   LABSPEC 1.017 04/11/2016 0116   PHURINE 7.0 04/11/2016 0116   GLUCOSEU NEGATIVE 04/11/2016 0116   GLUCOSEU NEGATIVE 03/01/2012 0910   HGBUR NEGATIVE 04/11/2016 0116   HGBUR negative 08/18/2008 1004   BILIRUBINUR NEGATIVE 04/11/2016 0116   BILIRUBINUR n 11/22/2013 1033   KETONESUR NEGATIVE 04/11/2016 0116   PROTEINUR NEGATIVE 04/11/2016 0116   PROTEINUR n 11/22/2013 1033   UROBILINOGEN 0.2 11/22/2013 1033   UROBILINOGEN 0.2 03/01/2012 0910   NITRITE NEGATIVE 04/11/2016 0116   NITRITE n 11/22/2013 1033   LEUKOCYTESUR NEGATIVE 04/11/2016 0116    Sepsis Labs: Lactic Acid, Venous    Component Value Date/Time   LATICACIDVEN 2.7* 04/18/2011 1424    MICROBIOLOGY: Recent Results (from the past 240 hour(s))  C difficile quick scan w PCR reflex     Status: None   Collection Time: 04/11/16  5:30 PM  Result Value Ref Range Status   C Diff antigen NEGATIVE NEGATIVE Final   C Diff toxin NEGATIVE NEGATIVE Final   C Diff interpretation No C. difficile detected.  Final    RADIOLOGY STUDIES/RESULTS: Ct Abdomen Pelvis W Contrast  04/11/2016  CLINICAL DATA:  Increasing general abdominal pain EXAM: CT ABDOMEN AND PELVIS WITH CONTRAST TECHNIQUE: Multidetector CT imaging of the abdomen and pelvis was performed using the standard protocol following bolus administration of intravenous contrast. CONTRAST:  189mL ISOVUE-300 IOPAMIDOL (ISOVUE-300) INJECTION 61% COMPARISON:  April 18, 2011 FINDINGS: No pulmonary nodules, masses, or focal infiltrates. Rounded nodularity to the right of the esophagus on series 2, image 1 is incompletely  characterized measuring 2.2 by 1.4 cm, possibly a paraesophageal lymph node but incompletely characterized. The lung bases are otherwise within normal limits. No free air. There is a small amount of free fluid in the pelvis. Multiple hepatic hemangiomas are again identified with the largest in the posterior right hepatic lobe. No suspicious hepatic masses. The portal vein is normal. Cholelithiasis is seen without wall thickening or pericholecystic fluid. The spleen, adrenal glands, right kidney, and pancreas are normal. There is a cyst in the left kidney which is otherwise normal. The abdominal aorta is normal in caliber with mild atherosclerotic change. No adenopathy. The stomach is distended. Multiple loops of small bowel are dilated out of proportion of the colon consistent with a partial or early small bowel obstruction. The transition between fluid-filled mildly dilated loops of  bowel and ileum containing fecal material appears to be in the anterior mid to lower abdomen just above the level of the umbilicus and just inferior to the hernia mesh. The actual cause for transition is not seen. The normal caliber distal ileum containing fecal material extends all the way to the cecum but is most prominent just before the anastomosis in the right lower quadrant. The colon is normal. The visualized appendix is normal as well with no evidence of appendicitis. There is free fluid in the right pericolic gutter and pelvis. The distal appendix lies within this free fluid but the fluid is thought to be due to the bowel obstruction rather than an appendiceal abnormality. The pelvis demonstrates no adenopathy or mass. The bladder is normal. The prostate seminal vesicles are unremarkable. Visualized bones are normal. Delayed images demonstrate no filling defects in the upper renal collecting cysts. IMPRESSION: 1. Partial or early small bowel obstruction. The transition point appears to be anterior in the abdomen between the  umbilicus and the inferior edge of the hernia mesh. The actual cause for obstruction is not discerned. 2. Rounded nodularity to the right of the esophagus is not completely evaluated. This could represent a prominent lymph node. CT imaging through this region could further evaluate. 3. Cholelithiasis. 4. Atherosclerosis in the aorta. Electronically Signed   By: Dorise Bullion III M.D   On: 04/11/2016 03:31   Dg Abd Portable 1v-small Bowel Obstruction Protocol-initial, 8 Hr Delay  04/11/2016  CLINICAL DATA:  Small-bowel obstruction protocol. 8 hour delayed film. History of colon polyps, IBS, hiatal hernia, exploration laparotomy and small murmur section, ventral hernia repair and GERD. EXAM: PORTABLE ABDOMEN - 1 VIEW COMPARISON:  None. FINDINGS: Oral contrast is seen within the distal small bowel and throughout the colon thereby excluding complete small bowel obstruction. No dilated small bowel loops identified. Surgical changes of anterior abdominal wall hernia repair. Osseous structures are unremarkable. IMPRESSION: Oral contrast now seen within the distal small bowel and throughout the large bowel thereby excluding a complete small bowel obstruction. No dilated bowel loops identified. Electronically Signed   By: Franki Cabot M.D.   On: 04/11/2016 21:16     LOS: 1 day   Thurnell Lose, MD  [facility] Pager:[phone]  If 7PM-7AM, please contact night-coverage [email or web address] Password [facility] 04/12/2016, 12:15 PM

## 2016-04-12 NOTE — Progress Notes (Signed)
Pt requesting something to eat and asking to take a shower. MD paged. Dr. Candiss Norse ordered a soft diet and said it was OK for pt to take a shower. VWilliams,rn.
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
## 2016-04-13 NOTE — Progress Notes (Signed)
Pt discharged to home. DC instructions given with wife at bedside. No concerns voiced. Left unit ambulatory accompanied by wife. Preferred to walk. No concerns voiced. VWilliams,rn.

## 2016-04-13 NOTE — Progress Notes (Signed)
  Subjective: No complaints. Feels great  Objective: Vital signs in last 24 hours: Temp:  [98 F (36.7 C)-98.3 F (36.8 C)] 98 F (36.7 C) (07/23 0520) Pulse Rate:  [66-81] 66 (07/23 0520) Resp:  [16-17] 16 (07/23 0520) BP: (124-135)/(76-80) 135/77 (07/23 0520) SpO2:  [97 %-98 %] 97 % (07/23 0520) Last BM Date: 04/12/16  Intake/Output from previous day: 07/22 0701 - 07/23 0700 In: 2833.3 [I.V.:2833.3] Out: -  Intake/Output this shift: No intake/output data recorded.  Resp: clear to auscultation bilaterally Cardio: regular rate and rhythm GI: soft, nontender. flat  Lab Results:   Recent Labs  04/11/16 0215 04/12/16 0623  WBC 10.4 5.7  HGB 13.9 13.8  HCT 40.5 41.2  PLT 170 174   BMET  Recent Labs  04/11/16 0101 04/12/16 0623  NA 142 143  K 3.3* 4.1  CL 107 111  CO2 28 27  GLUCOSE 113* 92  BUN 15 12  CREATININE 1.06 0.94  CALCIUM 9.2 8.8*   PT/INR No results for input(s): LABPROT, INR in the last 72 hours. ABG No results for input(s): PHART, HCO3 in the last 72 hours.  Invalid input(s): PCO2, PO2  Studies/Results: Dg Abd Portable 1v-small Bowel Obstruction Protocol-initial, 8 Hr Delay  Result Date: 04/11/2016 CLINICAL DATA:  Small-bowel obstruction protocol. 8 hour delayed film. History of colon polyps, IBS, hiatal hernia, exploration laparotomy and small murmur section, ventral hernia repair and GERD. EXAM: PORTABLE ABDOMEN - 1 VIEW COMPARISON:  None. FINDINGS: Oral contrast is seen within the distal small bowel and throughout the colon thereby excluding complete small bowel obstruction. No dilated small bowel loops identified. Surgical changes of anterior abdominal wall hernia repair. Osseous structures are unremarkable. IMPRESSION: Oral contrast now seen within the distal small bowel and throughout the large bowel thereby excluding a complete small bowel obstruction. No dilated bowel loops identified. Electronically Signed   By: Franki Cabot M.D.   On:  04/11/2016 21:16    Anti-infectives: Anti-infectives    None      Assessment/Plan: s/p * No surgery found * Advance diet Discharge  LOS: 2 days    TOTH III,Takaya Hyslop S 04/13/2016

## 2016-04-13 NOTE — Discharge Instructions (Signed)
Follow with Primary MD Garret Reddish, MD in 3-4 days, follow CT results with your PCP   Get CBC, CMP, 2 view Chest X ray checked  by Primary MD or SNF MD in 5-7 days ( we routinely change or add medications that can affect your baseline labs and fluid status, therefore we recommend that you get the mentioned basic workup next visit with your PCP, your PCP may decide not to get them or add new tests based on their clinical decision)   Activity: As tolerated with Full fall precautions use walker/cane & assistance as needed   Disposition Home     Diet:   Soft Heart Healthy  .  For Heart failure patients - Check your Weight same time everyday, if you gain over 2 pounds, or you develop in leg swelling, experience more shortness of breath or chest pain, call your Primary MD immediately. Follow Cardiac Low Salt Diet and 1.5 lit/day fluid restriction.   On your next visit with your primary care physician please Get Medicines reviewed and adjusted.   Please request your Prim.MD to go over all Hospital Tests and Procedure/Radiological results at the follow up, please get all Hospital records sent to your Prim MD by signing hospital release before you go home.   If you experience worsening of your admission symptoms, develop shortness of breath, life threatening emergency, suicidal or homicidal thoughts you must seek medical attention immediately by calling 911 or calling your MD immediately  if symptoms less severe.  You Must read complete instructions/literature along with all the possible adverse reactions/side effects for all the Medicines you take and that have been prescribed to you. Take any new Medicines after you have completely understood and accpet all the possible adverse reactions/side effects.   Do not drive, operate heavy machinery, perform activities at heights, swimming or participation in water activities or provide baby sitting services if your were admitted for syncope or siezures  until you have seen by Primary MD or a Neurologist and advised to do so again.  Do not drive when taking Pain medications.    Do not take more than prescribed Pain, Sleep and Anxiety Medications  Special Instructions: If you have smoked or chewed Tobacco  in the last 2 yrs please stop smoking, stop any regular Alcohol  and or any Recreational drug use.  Wear Seat belts while driving.   Please note  You were cared for by a hospitalist during your hospital stay. If you have any questions about your discharge medications or the care you received while you were in the hospital after you are discharged, you can call the unit and asked to speak with the hospitalist on call if the hospitalist that took care of you is not available. Once you are discharged, your primary care physician will handle any further medical issues. Please note that NO REFILLS for any discharge medications will be authorized once you are discharged, as it is imperative that you return to your primary care physician (or establish a relationship with a primary care physician if you do not have one) for your aftercare needs so that they can reassess your need for medications and monitor your lab values.

## 2016-04-14 ENCOUNTER — Telehealth: Payer: Self-pay | Admitting: Family Medicine

## 2016-04-14 NOTE — Telephone Encounter (Signed)
Pt dc 'd from the hospital for yesterday from intestinal blockage and advised to follow up with PCP within a week. Do you want to see this pt and work him in, or have him see another provider?  Pt also had a CPE on 8/7 that needs to be rescheduled, but he will wait to see what you advise about seeing him for the hospital follow up.Marland Kitchen

## 2016-04-14 NOTE — Telephone Encounter (Signed)
Discharge summary stated "Follow up with PCP in 1-2 weeks". I would be ok with him waiting until I get back from vacation to be seen and working him in early that week. If he wants to be seen sooner can be seen by a colleague next week.

## 2016-04-15 NOTE — Telephone Encounter (Signed)
Pt has been scheduled.  °

## 2016-04-15 NOTE — Telephone Encounter (Signed)
Left message for pt to call back  °

## 2016-04-15 NOTE — Telephone Encounter (Signed)
Please see message regarding scheduling

## 2016-04-21 ENCOUNTER — Other Ambulatory Visit: Payer: 59

## 2016-04-28 ENCOUNTER — Encounter: Payer: 59 | Admitting: Family Medicine

## 2016-04-28 ENCOUNTER — Encounter: Payer: Self-pay | Admitting: Family Medicine

## 2016-04-28 ENCOUNTER — Ambulatory Visit (INDEPENDENT_AMBULATORY_CARE_PROVIDER_SITE_OTHER): Payer: 59 | Admitting: Family Medicine

## 2016-04-28 VITALS — BP 138/96 | HR 85 | Temp 98.1°F | Wt 195.8 lb

## 2016-04-28 DIAGNOSIS — I10 Essential (primary) hypertension: Secondary | ICD-10-CM

## 2016-04-28 DIAGNOSIS — G4700 Insomnia, unspecified: Secondary | ICD-10-CM

## 2016-04-28 DIAGNOSIS — K229 Disease of esophagus, unspecified: Secondary | ICD-10-CM | POA: Diagnosis not present

## 2016-04-28 DIAGNOSIS — K5669 Other intestinal obstruction: Secondary | ICD-10-CM

## 2016-04-28 DIAGNOSIS — K56609 Unspecified intestinal obstruction, unspecified as to partial versus complete obstruction: Secondary | ICD-10-CM

## 2016-04-28 MED ORDER — HYDROCHLOROTHIAZIDE 25 MG PO TABS: 25.0000 mg | ORAL_TABLET | Freq: Every day | ORAL | 3 refills | Status: DC

## 2016-04-28 MED ORDER — CLONAZEPAM 0.5 MG PO TABS: 0.5000 mg | ORAL_TABLET | Freq: Every evening | ORAL | 5 refills | Status: DC | PRN

## 2016-04-28 NOTE — Patient Instructions (Signed)
Labs before you leave  We will call you within a week about your referral for CT of chest. If you do not hear within 2 weeks, give [place] a call.   Restart HCTZ 25mg   Refilled klonopin  Schedule physical within 3-4 months so we can recheck blood pressure back on medicine

## 2016-04-28 NOTE — Progress Notes (Signed)
Pre visit review using our clinic review tool, if applicable. No additional management support is needed unless otherwise documented below in the visit note. 

## 2016-04-28 NOTE — Assessment & Plan Note (Signed)
S: ran out of klonopin, had been effective. Prior ambien- sleptwalked on A/P: refilled klonopin for sparing use- does not use everynight
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
## 2016-04-28 NOTE — Assessment & Plan Note (Signed)
S: controlled previously on hctz 25mg , off after hospital BP Readings from Last 3 Encounters:  04/28/16 (!) 138/96  04/13/16 135/77  09/05/15 118/80  A/P: restart HCTZ, repeat CPE

## 2016-05-05 ENCOUNTER — Ambulatory Visit
Admission: RE | Admit: 2016-05-05 | Discharge: 2016-05-05 | Disposition: A | Discharge status: Home / Self Care | Payer: 59 | Source: Ambulatory Visit | Attending: Family Medicine | Admitting: Family Medicine

## 2016-05-05 DIAGNOSIS — K229 Disease of esophagus, unspecified: Secondary | ICD-10-CM

## 2016-05-05 MED ORDER — IOPAMIDOL (ISOVUE-300) INJECTION 61%
75.0000 mL | Freq: Once | INTRAVENOUS | Status: AC | PRN
  Administered 2016-05-05: 75 mL via INTRAVENOUS

## 2016-05-06 ENCOUNTER — Telehealth: Payer: Self-pay | Admitting: Family Medicine

## 2016-05-06 ENCOUNTER — Encounter: Payer: Self-pay | Admitting: Gastroenterology

## 2016-05-06 DIAGNOSIS — D869 Sarcoidosis, unspecified: Secondary | ICD-10-CM

## 2016-05-06 NOTE — Telephone Encounter (Signed)
Discussed by phone with patient- will refer to pulmonology

## 2016-05-06 NOTE — Telephone Encounter (Signed)
Pt state that you can call him at lunch from 12-1 today.

## 2016-06-03 ENCOUNTER — Other Ambulatory Visit (INDEPENDENT_AMBULATORY_CARE_PROVIDER_SITE_OTHER): Payer: 59

## 2016-06-03 ENCOUNTER — Ambulatory Visit (INDEPENDENT_AMBULATORY_CARE_PROVIDER_SITE_OTHER): Payer: 59 | Admitting: Pulmonary Disease

## 2016-06-03 ENCOUNTER — Encounter: Payer: Self-pay | Admitting: Pulmonary Disease

## 2016-06-03 VITALS — BP 146/84 | HR 75 | Ht 71.0 in | Wt 194.6 lb

## 2016-06-03 DIAGNOSIS — D869 Sarcoidosis, unspecified: Secondary | ICD-10-CM

## 2016-06-03 DIAGNOSIS — R918 Other nonspecific abnormal finding of lung field: Secondary | ICD-10-CM

## 2016-06-03 LAB — CBC WITH DIFFERENTIAL/PLATELET
BASOS ABS: 0 10*3/uL (ref 0.0–0.1)
BASOS PCT: 0.3 % (ref 0.0–3.0)
EOS ABS: 0.1 10*3/uL (ref 0.0–0.7)
Eosinophils Relative: 1.2 % (ref 0.0–5.0)
HEMATOCRIT: 44.8 % (ref 39.0–52.0)
HEMOGLOBIN: 15.5 g/dL (ref 13.0–17.0)
LYMPHS PCT: 13.7 % (ref 12.0–46.0)
Lymphs Abs: 0.7 10*3/uL (ref 0.7–4.0)
MCHC: 34.7 g/dL (ref 30.0–36.0)
MCV: 89.9 fl (ref 78.0–100.0)
MONOS PCT: 9.2 % (ref 3.0–12.0)
Monocytes Absolute: 0.5 10*3/uL (ref 0.1–1.0)
NEUTROS ABS: 4.1 10*3/uL (ref 1.4–7.7)
Neutrophils Relative %: 75.6 % (ref 43.0–77.0)
PLATELETS: 249 10*3/uL (ref 150.0–400.0)
RBC: 4.98 Mil/uL (ref 4.22–5.81)
RDW: 12.8 % (ref 11.5–15.5)
WBC: 5.4 10*3/uL (ref 4.0–10.5)

## 2016-06-03 LAB — BASIC METABOLIC PANEL
BUN: 16 mg/dL (ref 6–23)
CALCIUM: 9.4 mg/dL (ref 8.4–10.5)
CHLORIDE: 105 meq/L (ref 96–112)
CO2: 33 meq/L — AB (ref 19–32)
CREATININE: 1.01 mg/dL (ref 0.40–1.50)
GFR: 82.51 mL/min (ref >60.00)
GLUCOSE: 96 mg/dL (ref 70–99)
Potassium: 3.9 mEq/L (ref 3.5–5.1)
Sodium: 142 mEq/L (ref 135–145)

## 2016-06-03 LAB — HEPATIC FUNCTION PANEL
ALBUMIN: 4.6 g/dL (ref 3.5–5.2)
ALK PHOS: 88 U/L (ref 39–117)
ALT: 37 U/L (ref 0–53)
AST: 23 U/L (ref 0–37)
Bilirubin, Direct: 0.1 mg/dL (ref 0.0–0.3)
TOTAL PROTEIN: 7 g/dL (ref 6.0–8.3)
Total Bilirubin: 0.6 mg/dL (ref 0.2–1.2)

## 2016-06-03 LAB — PROTIME-INR
INR: 1.1 ratio — AB (ref 0.8–1.0)
Prothrombin Time: 11.7 s (ref 9.6–13.1)

## 2016-06-03 LAB — APTT: aPTT: 29.8 s (ref 23.4–32.7)

## 2016-06-03 LAB — C-REACTIVE PROTEIN: CRP: 0.2 mg/dL — ABNORMAL LOW (ref 0.5–20.0)

## 2016-06-03 LAB — SEDIMENTATION RATE: Sed Rate: 2 mm/hr (ref 0–20)

## 2016-06-03 NOTE — Patient Instructions (Addendum)
It is nice to meet you today. We will schedule you for a bronchoscopy and biopsy of the pulmonary nodules with Dr. Vaughan Browner. We will complete labs today.  ( LFT's, PT, Ptt, INR, ACE level, BMET,RF,CCP,Sed Rate, ANCA, CRP, CBC with Diff) Follow up with Dr. Vaughan Browner in 2 weeks. Please contact office for sooner follow up if symptoms do not improve or worsen or seek emergency care

## 2016-06-03 NOTE — H&P (Signed)
History of Present Illness Bradley [name] is a 50 y.o. male with pulmonary nodules suspected to be sarcoidosis based on CT Chest ( 05/05/2016) referred by D. Hunter  for consultation with Dr. Vaughan Browner    9/12/2017Consultation for suspected sarcoidosis per CT Chest. Pt.had back surgery in 02/2016, and then developed  ileus requiring hospitalization in July, four weeks after the surgery.. CT Abdomen revealed a questionable para esophogeal lymph node. This was then followed with a CT Chest which found Mediastinal and bilateral hilar lymphadenopathy with numerous pulmonary nodules located along lung fissures which are suspicious for sarcoidosis. Patient has noted an increase in shortness of breath over the lase few years which he had attributed to getting older. He states he recovers quickly for his shortness of breath with rest. He states he sometimes has a sensation of needing to take a deep breath even at rest. There is no family history of auto immune disease. Mother had thyroid and breast cancer and is living . He has 4 siblings who are in good health. Pt. Has worn glasses since he was 50 years old and sees and eye doctor annually ( [facility]) Dr. [place]. He is scheduled there in October.He states he does get a cough twice yearly that lasts about a month and then self resolves..  Tests: CT Chest 05/05/2016 Mediastinal and bilateral hilar lymphadenopathy with numerous pulmonary nodules. Some of these nodules are located along the fissures. The combination of chest lymphadenopathy and nodule distribution can be associated with sarcoidosis. Recommend Pulmonary consultation for additional evaluation and to exclude another process causing the lymphadenopathy and pulmonary nodules.  Past medical hx Past Medical History:  Diagnosis Date  . Cough variant asthma 10/12/2007  . Elevated LFTs   . Esophageal reflux   . GERD (gastroesophageal reflux disease)   . Hiatal hernia   . Hyperlipidemia   .  Irritable bowel syndrome   . LATERAL EPICONDYLITIS 03/07/2010   Qualifier: Diagnosis of  By: Elease Hashimoto MD, Bruce    . Liver lesion   . Personal history of colonic polyps 09/28/2009    ADENOMATOUS POLYP  . Schatzki's ring   . Vitamin B 12 deficiency      Past surgical hx, Family hx, Social hx all reviewed.  Current Outpatient Prescriptions on File Prior to Visit  Medication Sig  . atorvastatin (LIPITOR) 10 MG tablet Take 10 mg by mouth daily.  . clonazePAM (KLONOPIN) 0.5 MG tablet Take 1 tablet (0.5 mg total) by mouth at bedtime as needed (sleep).  . Dexlansoprazole 30 MG capsule Take 1 capsule (30 mg total) by mouth daily.  Marland Kitchen tiZANidine (ZANAFLEX) 2 MG tablet Take 2 mg by mouth every 6 (six) hours as needed.  . hydrochlorothiazide (HYDRODIURIL) 25 MG tablet Take 1 tablet (25 mg total) by mouth daily. (Patient not taking: Reported on 06/03/2016)  . [DISCONTINUED] bisoprolol-hydrochlorothiazide (ZIAC) 2.5-6.25 MG per tablet Take 1 tablet by mouth daily.   No current facility-administered medications on file prior to visit.     Family History  Problem Relation Age of Onset  . Breast cancer Mother   . Thyroid cancer Mother   . Colon cancer Neg Hx     Social History   Social History  . Marital status: Married    Spouse name: N/A  . Number of children: 2  . Years of education: N/A   Occupational History  . ELECTRICIAN [employer]   Social History Main Topics  . Smoking status: Never Smoker  . Smokeless tobacco:  Never Used  . Alcohol use Yes     Comment: occ.  . Drug use: No  . Sexual activity: Not Asked   Other Topics Concern  . None   Social History Narrative   Married, lives with wife and son   OCCUPATION: Physiological scientist     Past Surgical History:  Procedure Laterality Date  . Exploration Laparotomy and Small Bowel resection    . LEG SURGERY     left  . LUMBAR LAMINECTOMY     x2  . VENTRAL HERNIA REPAIR      No Known Allergies  Review Of  Systems:  Constitutional:   No  weight loss, night sweats,  Fevers, chills, fatigue, or  lassitude.  HEENT:   No headaches,  Difficulty swallowing,  Tooth/dental problems, or  Sore throat,                No sneezing, itching, ear ache, nasal congestion, post nasal drip,   CV:  No chest pain,  Orthopnea, PND, swelling in lower extremities, anasarca, dizziness, palpitations, syncope.   GI  No heartburn, indigestion, abdominal pain, nausea, vomiting, diarrhea, change in bowel habits, loss of appetite, bloody stools.   Resp: + shortness of breath with exertion and sometimes  at rest.  No excess mucus, no productive cough,  No non-productive cough,  No coughing up of blood.  No change in color of mucus.  No wheezing.  No chest wall deformity  Skin: no rash or lesions.  GU: no dysuria, change in color of urine, no urgency or frequency.  No flank pain, no hematuria   MS:  No joint pain or swelling.  No decreased range of motion.  No back pain.  Psych:  No change in mood or affect. No depression or anxiety.  No memory loss.   Vital Signs BP (!) 146/84 (BP Location: Left Arm, Cuff Size: Normal)   Pulse 75   Ht 5\' 11"  (1.803 m)   Wt 194 lb 9.6 oz (88.3 kg)   SpO2 98%   BMI 27.14 kg/m    Physical Exam:  General- No distress,  A&Ox3, pleasant ENT: No sinus tenderness, TM clear, pale nasal mucosa, no oral exudate,no post nasal drip, no LAN Cardiac: S1, S2, regular rate and rhythm, no murmur Chest: No wheeze/ rales/ dullness; no accessory muscle use, no nasal flaring, no sternal retractions Abd.: Soft Non-tender Ext: No clubbing cyanosis, edema Neuro:  normal strength Skin: No rashes, warm and dry Psych: normal mood and behavior   Assessment/Plan  Pulmonary nodules Pulmonary nodules suspicious for sarcoidosis Plan: We will schedule you for a bronchoscopy and biopsy of the pulmonary nodules with Dr. Vaughan Browner. We will complete labs today.  ( LFT's, PT, Ptt, INR, ACE level,  BMET,RF,CCP,Sed Rate, ANCA, CRP, CBC with Diff) Follow up with Dr. Vaughan Browner in 2 weeks. Please contact office for sooner follow up if symptoms do not improve or worsen or seek emergency care      Magdalen Spatz, NP 06/03/2016  4:28 PM   Attending note: I have seen and examined the patient with nurse practitioner/resident and agree with the note. History, labs and imaging reviewed.  50 Y/O with PMH of asthma, GERD. He has been sent for pulmonary consultation by Dr. Yong Channel for evaluation of pulmonary nodules. I have personally reviewed his chest and abdomen imaging. They show multiple pulmonary nodules in broncho vascular distribution with mediastinal LNs. This is suggestive of sarcoid. Other possibilities include HP, eosinophilic PNA, connective tissue disorders,  atypical infections. There are multiple liver lesions as well which appear to he hemangiomas and may not be connected to the pulmonary process.  I will send a basic interstitial and connective tissue workup. He will be scheduled for bronchoscopy with BAL and transbronchial biopsies. I discussed the potential complications incuding hemorrhage, pneumothorax. He understands and wishes to proceed.  Marshell Garfinkel MD [facility] Pager [phone] If no answer or after 3pm call: [phone] 06/07/2016, 9:02 AM   CC: Dr Rushie Chestnut.

## 2016-06-03 NOTE — Assessment & Plan Note (Addendum)
Pulmonary nodules suspicious for sarcoidosis Plan: We will schedule you for a bronchoscopy and biopsy of the pulmonary nodules with Dr. Vaughan Browner. We will complete labs today.  ( LFT's, PT, Ptt, INR, ACE level, BMET,RF,CCP,Sed Rate, ANCA, CRP, CBC with Diff) Follow up with Dr. Vaughan Browner in 2 weeks. Please contact office for sooner follow up if symptoms do not improve or worsen or seek emergency care

## 2016-06-04 LAB — RHEUMATOID FACTOR: Rhuematoid fact SerPl-aCnc: <14 IU/mL (ref <14)

## 2016-06-04 LAB — ANGIOTENSIN CONVERTING ENZYME: Angiotensin-Converting Enzyme: 40 U/L (ref 9–67)

## 2016-06-04 LAB — CYCLIC CITRUL PEPTIDE ANTIBODY, IGG: Cyclic Citrullin Peptide Ab: <16 Units

## 2016-06-05 ENCOUNTER — Telehealth: Payer: Self-pay | Admitting: Pulmonary Disease

## 2016-06-05 LAB — PAN-ANCA: ANCA Screen: NEGATIVE

## 2016-06-05 NOTE — Telephone Encounter (Signed)
Please let the patient know that there is no opening in the bronch suite for procedure tomorrow. We are working on scheduling him for next week and will give him a call as soon as we have a date and time.

## 2016-06-05 NOTE — Telephone Encounter (Signed)
Spoke with pt. He is aware of his bronch appointment. Advised him to call back if he had any further questions. Nothing further was needed at this time.

## 2016-06-05 NOTE — Telephone Encounter (Signed)
Bronch has been scheduled for 9.19.17 @ 0830 at [facility].  He will be called the afternoon prior with all of the specifics. LMOM TCB x1

## 2016-06-05 NOTE — Telephone Encounter (Signed)
Spoke with pt. He is aware of PM's response. Will route to PM to have bronch scheduled.

## 2016-06-05 NOTE — Telephone Encounter (Signed)
Patient is calling bc he reports a biospy was suppose to be scheduled for tomorrow and has not heard anything back. Please advise Dr. Vaughan Browner thanks

## 2016-06-05 NOTE — Telephone Encounter (Signed)
Patient called and wasn't sure if you still needed to speak with him. He can be reached at [phone] -pr

## 2016-06-09 ENCOUNTER — Telehealth: Payer: Self-pay | Admitting: Pulmonary Disease

## 2016-06-09 ENCOUNTER — Encounter (HOSPITAL_COMMUNITY): Payer: Self-pay

## 2016-06-09 NOTE — Telephone Encounter (Signed)
Bradley [name] is good for one day 06/10/16 [id number]

## 2016-06-09 NOTE — Telephone Encounter (Signed)
Message will be routed to [facility] as they handle percerts. This is in regards to the bronch pt has scheduled tomorrow.

## 2016-06-09 NOTE — Telephone Encounter (Signed)
Tried to call # left for [place] at [facility] and it was no longer in progress auth# place in appt Joellen [place]

## 2016-06-10 ENCOUNTER — Encounter (HOSPITAL_COMMUNITY): Payer: Self-pay | Admitting: Radiology

## 2016-06-10 ENCOUNTER — Encounter (HOSPITAL_COMMUNITY): Payer: Self-pay

## 2016-06-10 ENCOUNTER — Ambulatory Visit (HOSPITAL_COMMUNITY): Payer: 59

## 2016-06-10 ENCOUNTER — Inpatient Hospital Stay (HOSPITAL_COMMUNITY)
Admission: RE | Admit: 2016-06-10 | Discharge: 2016-06-10 | Disposition: A | Discharge status: Home / Self Care | Payer: 59 | Source: Ambulatory Visit

## 2016-06-10 ENCOUNTER — Encounter (HOSPITAL_COMMUNITY)
Admission: RE | Disposition: A | Discharge status: Home / Self Care | Payer: Self-pay | Source: Ambulatory Visit | Attending: Pulmonary Disease

## 2016-06-10 ENCOUNTER — Ambulatory Visit (HOSPITAL_COMMUNITY)
Admission: RE | Admit: 2016-06-10 | Discharge: 2016-06-10 | Disposition: A | Discharge status: Home / Self Care | Payer: 59 | Source: Ambulatory Visit | Attending: Pulmonary Disease | Admitting: Pulmonary Disease

## 2016-06-10 DIAGNOSIS — R918 Other nonspecific abnormal finding of lung field: Secondary | ICD-10-CM

## 2016-06-10 DIAGNOSIS — D1431 Benign neoplasm of right bronchus and lung: Secondary | ICD-10-CM | POA: Insufficient documentation

## 2016-06-10 DIAGNOSIS — Z9889 Other specified postprocedural states: Secondary | ICD-10-CM

## 2016-06-10 HISTORY — PX: VIDEO BRONCHOSCOPY: SHX5072

## 2016-06-10 LAB — BODY FLUID CELL COUNT WITH DIFFERENTIAL
EOS FL: 1 %
LYMPHS FL: 73 %
LYMPHS FL: 85 %
MONOCYTE-MACROPHAGE-SEROUS FLUID: 5 % — AB (ref 50–90)
MONOCYTE-MACROPHAGE-SEROUS FLUID: 6 % — AB (ref 50–90)
NEUTROPHIL FLUID: 9 % (ref 0–25)
Neutrophil Count, Fluid: 21 % (ref 0–25)
Total Nucleated Cell Count, Fluid: 131 cu mm (ref 0–1000)
WBC FLUID: 76 uL (ref 0–1000)

## 2016-06-10 SURGERY — BRONCHOSCOPY, WITH FLUOROSCOPY
Anesthesia: Moderate Sedation | Laterality: Bilateral

## 2016-06-10 MED ORDER — FENTANYL CITRATE (PF) 100 MCG/2ML IJ SOLN
INTRAMUSCULAR | Status: DC | PRN
  Administered 2016-06-10 (×8): 25 ug via INTRAVENOUS

## 2016-06-10 MED ORDER — LIDOCAINE HCL (PF) 1 % IJ SOLN
INTRAMUSCULAR | Status: DC | PRN
Start: 2016-06-10 — End: 2016-06-10
  Administered 2016-06-10: 5 mL

## 2016-06-10 MED ORDER — SODIUM CHLORIDE 0.9 % IV SOLN
INTRAVENOUS | Status: DC
  Administered 2016-06-10: 08:00:00 via INTRAVENOUS

## 2016-06-10 MED ORDER — MIDAZOLAM HCL 5 MG/ML IJ SOLN
INTRAMUSCULAR | Status: AC
  Filled 2016-06-10: qty 2

## 2016-06-10 MED ORDER — PHENYLEPHRINE HCL 0.5 % NA SOLN
NASAL | Status: DC | PRN
  Administered 2016-06-10: 2 [drp] via NASAL

## 2016-06-10 MED ORDER — LIDOCAINE HCL 2 % EX GEL
CUTANEOUS | Status: DC | PRN
Start: 2016-06-10 — End: 2016-06-10
  Administered 2016-06-10: 1

## 2016-06-10 MED ORDER — MIDAZOLAM HCL 10 MG/2ML IJ SOLN
INTRAMUSCULAR | Status: DC | PRN
  Administered 2016-06-10 (×3): 1 mg via INTRAVENOUS
  Administered 2016-06-10: 2 mg via INTRAVENOUS
  Administered 2016-06-10 (×3): 1 mg via INTRAVENOUS
  Administered 2016-06-10: 2 mg via INTRAVENOUS

## 2016-06-10 MED ORDER — FENTANYL CITRATE (PF) 100 MCG/2ML IJ SOLN
INTRAMUSCULAR | Status: AC
  Filled 2016-06-10: qty 4

## 2016-06-10 NOTE — Discharge Instructions (Signed)
Nothing to eat or drink till 1100 am today (06/10/16)

## 2016-06-10 NOTE — Progress Notes (Signed)
Video bronchoscopy performed with Dr Vaughan Browner.  Bronchial washing and forceps used. Sample sent to lab.  Pt tolerated procedure well.

## 2016-06-10 NOTE — H&P (Addendum)
Pt evaluated in bronch suite before procedure. Please see clinic note from 9/12 for full details  Pt feels well with no resp complaints. He is feeling a little nervous about the procedure. Blood pressure (!) 183/90, pulse 86, temperature 98.3 F (36.8 C), temperature source Oral, resp. rate 16, SpO2 100 %. Awake, Alert, Oriented Neuro: No focal deficits CVS-RRR RS- Clear, no wheeze or crackles Abd- Soft, + BS Ext- no edema  CBC, coags reviewed  Ok to proceed with bronchoscopy with BAL, biopsy  Marshell Garfinkel MD [facility] Pager [phone] If no answer or after 3pm call: [phone] 06/10/2016, 9:23 AM

## 2016-06-10 NOTE — Op Note (Signed)
[facility] Cardiopulmonary Patient Name: Bradley [name] Date: 06/10/2016 MRN: [id number] Attending MD: Marshell Garfinkel , MD Date of Birth: 1966-02-12 CSN: Finalized Age: 50 Admit Type: Outpatient Gender: Male Procedure:            Bronchoscopy Indications:          Multiple pulmonary nodules, Abnormal CT scan of chest Providers:            Marshell Garfinkel, MD, Doris Cheadle RRT,RCP, Tammie                        Readling RRT,RCP Referring MD:          Medicines:            Fentanyl 200 mcg IV, Midazolam 10 mg IV Complications:        No immediate complications. Estimated blood loss: None                       It was very difficult to sedate the patient inspite of                        high doses of versed and fentanyl. He kept coughing                        during the procedure hence only 4 biopsies could be                        obtained. If we need to do a bronchoscope again then he                        would geen GA. Estimated Blood Loss: Estimated blood loss: none. Procedure:            Pre-Anesthesia Assessment:                       - A History and Physical has been performed. Patient                        meds and allergies have been reviewed. The risks and                        benefits of the procedure and the sedation options and                        risks were discussed with the patient. All questions                        were answered and informed consent was obtained.                        Patient identification and proposed procedure were                        verified prior to the procedure by the physician.                        Mental Status Examination: alert and oriented. Airway  Examination: normal oropharyngeal airway. Respiratory                        Examination: clear to auscultation. CV Examination:                        RRR, no murmurs, no S3 or S4. ASA Grade Assessment: I -                        A  normal healthy patient. After reviewing the risks and                        benefits, the patient was deemed in satisfactory                        condition to undergo the procedure. The anesthesia plan                        was to use moderate sedation / analgesia (conscious                        sedation). Immediately prior to administration of                        medications, the patient was re-assessed for adequacy                        to receive sedatives. The heart rate, respiratory rate,                        oxygen saturations, blood pressure, adequacy of                        pulmonary ventilation, and response to care were                        monitored throughout the procedure. The physical status                        of the patient was re-assessed after the procedure.                       After obtaining informed consent, the bronchoscope was                        passed under direct vision. Throughout the procedure,                        the patient's blood pressure, pulse, and oxygen                        saturations were monitored continuously. the [id number]                        [id number] scope was introduced through the right nostril                        and advanced to the tracheobronchial tree of both  lungs. The patient tolerated the procedure. The total                        duration of the procedure was 30 minutes. Scope In: 8:39:56 AM Scope Out: 8:58:09 AM Findings:      The endotracheal tube is in good position. The trachea is of normal       caliber. The carina is sharp. The tracheobronchial tree was examined to       at least the first subsegmental level. Bronchial mucosa and anatomy are       normal; there are no endobronchial lesions, and no secretions.      Transbronchial biopsies of a lesion were performed in the posterior       segment of the right upper lobe using alligator forceps and sent for       histopathology  examination. Five biopsy passes were performed. Four       biopsy samples were obtained. The procedure was guided by fluoroscopy.       Bronchoalveolar lavage was performed in the RUL posterior segment (B2)       of the lung and sent for cell count, bacterial culture, viral smears &       culture, fungal & AFB analysis and cytology, cell count and differential       and flow cytometry. 160 mL of fluid were instilled. 90 mL were returned.       The return was cloudy. There were no mucoid plugs in the return fluid.       Multiple specimens were obtained and pooled into one specimen, which was       sent for analysis. Impression:           - Multiple pulmonary nodules                       - Abnormal CT scan of chest                       - The examination was normal.                       - Transbronchial lung biopsies were performed.                       - Bronchoalveolar lavage was performed. Moderate Sedation:      Moderate (conscious) sedation was administered by the endoscopy nurse       and supervised by the endoscopist. The following parameters were       monitored: oxygen saturation, heart rate, blood pressure, respiratory       rate, EKG, adequacy of pulmonary ventilation, and response to care.       Total physician intraservice time was 30 minutes. Recommendation:       - Discharge patient to home.                       - Await BAL, biopsy, culture and cytology results. Procedure Code(s):    --- Professional ---                       [phone], Bronchoscopy, rigid or flexible, including                        fluoroscopic guidance, when performed; with  transbronchial lung biopsy(s), single lobe                       31624, Bronchoscopy, rigid or flexible, including                        fluoroscopic guidance, when performed; with bronchial                        alveolar lavage Diagnosis Code(s):    --- Professional ---                       R91.8, Other  nonspecific abnormal finding of lung field                       R93.8, Abnormal findings on diagnostic imaging of other                        specified body structures CPT copyright 2016 American Medical Association. All rights reserved. The codes documented in this report are preliminary and upon coder review may  be revised to meet current compliance requirements. Marshell Garfinkel, MD 06/10/2016 9:37:58 AM Number of Addenda: 0

## 2016-06-11 ENCOUNTER — Encounter (HOSPITAL_COMMUNITY): Payer: Self-pay | Admitting: Pulmonary Disease

## 2016-06-11 LAB — PNEUMOCYSTIS JIROVECI SMEAR BY DFA
PNEUMOCYSTIS JIROVECI AG: NEGATIVE
Pneumocystis jiroveci Ag: NEGATIVE

## 2016-06-12 ENCOUNTER — Telehealth: Payer: Self-pay | Admitting: Pulmonary Disease

## 2016-06-12 LAB — CULTURE, BAL-QUANTITATIVE: CULTURE: NO GROWTH

## 2016-06-12 LAB — ACID FAST SMEAR (AFB, MYCOBACTERIA)
Acid Fast Smear: NEGATIVE
Acid Fast Smear: NEGATIVE

## 2016-06-12 LAB — CULTURE, BAL-QUANTITATIVE W GRAM STAIN

## 2016-06-12 MED ORDER — LEVOFLOXACIN 750 MG PO TABS: 750.0000 mg | ORAL_TABLET | Freq: Every day | ORAL | 0 refills | Status: DC

## 2016-06-12 MED ORDER — BENZONATATE 200 MG PO CAPS: 200.0000 mg | ORAL_CAPSULE | Freq: Three times a day (TID) | ORAL | 1 refills | Status: DC | PRN

## 2016-06-12 NOTE — Telephone Encounter (Signed)
Called spoke with pt. Aware of below. Rx's sent in. Nothing further needed

## 2016-06-12 NOTE — Telephone Encounter (Signed)
Spoke with pt. He had bronch done Tuesday. Today he has started coughing up dime size, bright red blood mixe w/ yellow mucus. He denies any other symptoms. Wants to know if this is normal? Please advise PM thanks

## 2016-06-12 NOTE — Telephone Encounter (Signed)
Patient called back - pr  °

## 2016-06-12 NOTE — Telephone Encounter (Signed)
LMTCB x1 for pt.  

## 2016-06-12 NOTE — Telephone Encounter (Signed)
Small amounts of blood in sputum are normal after a lung biopsy. Please ask him to call us back or go to ED if there is significant worsening of blood in sputum, dyspnea, cough, fevers, chills.   His prelim results show small amount of bacteria in lavage. Please order levaquin 750 mg daily for 7 days and tessalon perles, delsym for cough. I will call him when the final results are back next week. Thanks.  Marshell Garfinkel MD [place] Pager [phone] 06/12/2016, 12:44 PM

## 2016-06-14 LAB — CULTURE, BAL-QUANTITATIVE W GRAM STAIN: Culture: 10000 — AB

## 2016-06-14 LAB — CULTURE, BAL-QUANTITATIVE

## 2016-06-17 ENCOUNTER — Encounter: Payer: Self-pay | Admitting: Pulmonary Disease

## 2016-06-17 ENCOUNTER — Ambulatory Visit (INDEPENDENT_AMBULATORY_CARE_PROVIDER_SITE_OTHER): Payer: 59 | Admitting: Pulmonary Disease

## 2016-06-17 VITALS — BP 122/76 | HR 77 | Wt 191.4 lb

## 2016-06-17 DIAGNOSIS — R918 Other nonspecific abnormal finding of lung field: Secondary | ICD-10-CM

## 2016-06-17 DIAGNOSIS — Z23 Encounter for immunization: Secondary | ICD-10-CM | POA: Diagnosis not present

## 2016-06-17 NOTE — Progress Notes (Signed)
Bradley [name]    [id number]    [birth year]  Primary Care Physician:Stephen Yong Channel, MD  Referring Physician: Marin Olp, MD Bradley [name], [place] [address]  Chief complaint:  Follow up for pulmonary nodules  HPI: Mr. Sanden is a 50 year old seen earlier this month in evaluation for pulmonary nodules, lymphadenopathy. Here his surgery in June 2017 and developed ileus. A CT scan abdomen showed paraesophageal lymph node. This was followed by CT of the chest which showed mediastinal, hilar lymphadenopathy with numerous pulmonary nodules suspicious for sarcoidosis. Patient underwent a bronchoscopic transbronchial biopsies on September 19. The results reviewed below-significant for MSSA in BAL, elevated CD4: CD8 ratio. Path is negative for granulomas or malignancy.   Patient is complains of chronic cough, rhinitis, postnasal drip. He has significant issues with GERD and is on dexilant PPI. He's had a few episodes of minor hemoptysis last week post-bronchoscopy which has resolved spontaneously. He is currently finishing a course of Levaquin for MSSA in BAL. He denies any dyspnea, sputum production, wheezing episodes, chest pain, palpitation, fevers, chills.  Outpatient Encounter Prescriptions as of 06/17/2016  Medication Sig  . atorvastatin (LIPITOR) 10 MG tablet Take 10 mg by mouth daily.  . benzonatate (TESSALON) 200 MG capsule Take 1 capsule (200 mg total) by mouth 3 (three) times daily as needed for cough.  . clonazePAM (KLONOPIN) 0.5 MG tablet Take 1 tablet (0.5 mg total) by mouth at bedtime as needed (sleep).  . cyanocobalamin (,VITAMIN B-12,) 1000 MCG/ML injection Inject 1,000 mcg into the skin every 30 (thirty) days.  Marland Kitchen Dexlansoprazole 30 MG capsule Take 1 capsule (30 mg total) by mouth daily.  . hydrochlorothiazide (HYDRODIURIL) 25 MG tablet Take 1 tablet (25 mg total) by mouth daily.  Marland Kitchen levofloxacin (LEVAQUIN) 750 MG tablet Take 1 tablet (750 mg total) by mouth  daily.  Marland Kitchen PAZEO 0.7 % SOLN Use as needed  . tiZANidine (ZANAFLEX) 2 MG tablet Take 2 mg by mouth every 6 (six) hours as needed.   No facility-administered encounter medications on file as of 06/17/2016.     Allergies as of 06/17/2016  . (No Known Allergies)    Past Medical History:  Diagnosis Date  . Cough variant asthma 10/12/2007  . Elevated LFTs   . Esophageal reflux   . GERD (gastroesophageal reflux disease)   . Hiatal hernia   . Hyperlipidemia   . Irritable bowel syndrome   . LATERAL EPICONDYLITIS 03/07/2010   Qualifier: Diagnosis of  By: Elease Hashimoto MD, Bruce    . Liver lesion   . Personal history of colonic polyps 09/28/2009    ADENOMATOUS POLYP  . Schatzki's ring   . Vitamin B 12 deficiency     Past Surgical History:  Procedure Laterality Date  . Exploration Laparotomy and Small Bowel resection    . LEG SURGERY     left  . LUMBAR LAMINECTOMY     x2  . VENTRAL HERNIA REPAIR    . VIDEO BRONCHOSCOPY Bilateral 06/10/2016   Procedure: VIDEO BRONCHOSCOPY WITH FLUORO;  Surgeon: Marshell Garfinkel, MD;  Location: [place];  Service: Cardiopulmonary;  Laterality: Bilateral;    Family History  Problem Relation Age of Onset  . Breast cancer Mother   . Thyroid cancer Mother   . Colon cancer Neg Hx     Social History   Social History  . Marital status: Married    Spouse name: N/A  . Number of children: 2  . Years  of education: N/A   Occupational History  . ELECTRICIAN [employer]   Social History Main Topics  . Smoking status: Never Smoker  . Smokeless tobacco: Never Used  . Alcohol use Yes     Comment: occ.  . Drug use: No  . Sexual activity: Not on file   Other Topics Concern  . Not on file   Social History Narrative   Married, lives with wife and son   OCCUPATION: Physiological scientist      Review of systems: Review of Systems  Constitutional: Negative for fever and chills.  HENT: Negative.   Eyes: Negative for blurred vision.  Respiratory: as per HPI    Cardiovascular: Negative for chest pain and palpitations.  Gastrointestinal: Negative for vomiting, diarrhea, blood per rectum. Genitourinary: Negative for dysuria, urgency, frequency and hematuria.  Musculoskeletal: Negative for myalgias, back pain and joint pain.  Skin: Negative for itching and rash.  Neurological: Negative for dizziness, tremors, focal weakness, seizures and loss of consciousness.  Endo/Heme/Allergies: Negative for environmental allergies.  Psychiatric/Behavioral: Negative for depression, suicidal ideas and hallucinations.  All other systems reviewed and are negative.   Physical Exam: Blood pressure 122/76, pulse 77, weight 191 lb 6.4 oz (86.8 kg), SpO2 98 %. Gen:      No acute distress HEENT:  EOMI, sclera anicteric Neck:     No masses; no thyromegaly Lungs:    Clear to auscultation bilaterally; normal respiratory effort CV:         Regular rate and rhythm; no murmurs Abd:      + bowel sounds; soft, non-tender; no palpable masses, no distension Ext:    No edema; adequate peripheral perfusion Skin:      Warm and dry; no rash Neuro: alert and oriented x 3 Psych: normal mood and affect  Data Reviewed: CT chest 05/05/16- mediastinal, hilar lymphadenopathy. Numerous subcentimeter pulmonary nodules. Images reviewed.  Bronchoscopy with BAL, transbronchial biopsy 06/10/16 BAL cell count- WBC 76, 85% lymph, 9% neutrophils CD4:CD8 ratio- 6.5:1 Micro- MSSA Path- Benign lung tissue Cytology- benign  Serologies 06/03/16 Met panel, CBC- Normal CRP 0.2, Sed rate 2. ACE- 40 (neg) CCP, RA- Neg  Assessment:  #1 Multiple pulmonary nodules, mediastinal, hilar lymphadenopathy. Although the pattern is suspicious for sarcoid he has a negative ACE levels and transbronchial biopsies. His CD4: CD8 ratio is elevated but this is a nonspecific indicator of sarcoidosis. As he is growing MSSA on the BAL we will treat this with Levaquin for 7 days. He'll get a follow-up CT later this  year. If the lymphadenopathy and pulmonary nodules persist then we can consdier an EBUS or mediastinoscopy for further evaluation.  #2 Chronic cough This may be related to the above. It is likely his postnasal drip and GERD symptoms are playing a significant role in the cough. I will start him on antihistamine, chlorpheniramine and dymista nasal spray. He continues the dexilant for GERD.  Plan/Recommendations: - Finish levofloxacin - Repeat CT chest - Start chlorpheniramine and dymista - Continue dexilant for GERD.  Marshell Garfinkel MD [place] [facility] Pager [phone] 06/17/2016, 5:00 PM  CC: Marin Olp, MD

## 2016-06-17 NOTE — Patient Instructions (Addendum)
Will start on chlorpheniramine 8 mg 3 times daily and dymista nasal spray. Finish taking the Levaquin. We will schedule you for a CT of the chest without contrast in 2 months time. Return to clinic after CT scan to review results.

## 2016-06-18 NOTE — Progress Notes (Signed)
Spoke with patient who has had extensive lab work done since then.

## 2016-06-19 ENCOUNTER — Ambulatory Visit (AMBULATORY_SURGERY_CENTER): Payer: Self-pay | Admitting: *Deleted

## 2016-06-19 ENCOUNTER — Telehealth: Payer: Self-pay | Admitting: *Deleted

## 2016-06-19 ENCOUNTER — Telehealth: Payer: Self-pay | Admitting: Pulmonary Disease

## 2016-06-19 VITALS — Ht 71.0 in | Wt 191.6 lb

## 2016-06-19 DIAGNOSIS — Z86010 Personal history of colonic polyps: Secondary | ICD-10-CM

## 2016-06-19 MED ORDER — AZELASTINE-FLUTICASONE 137-50 MCG/ACT NA SUSP: 1.0000 | Freq: Two times a day (BID) | NASAL | 3 refills | Status: DC

## 2016-06-19 MED ORDER — NA SULFATE-K SULFATE-MG SULF 17.5-3.13-1.6 GM/177ML PO SOLN: ORAL | 0 refills | Status: DC

## 2016-06-19 NOTE — Telephone Encounter (Signed)
Called spoke with pt. Aware chlortrimeton is OTC. I have sent in Rx for dymsita. Nothing further needed

## 2016-06-19 NOTE — Telephone Encounter (Signed)
Per 06/17/16 OV: Instructions  Will start on chlorpheniramine 8 mg 3 times daily and dymista nasal spray. Finish taking the Levaquin. We will schedule you for a CT of the chest without contrast in 2 months time. Return to clinic after CT scan to review results  ---  LMTCB x1 We can send in dymista but chlorpheniramine is OTC

## 2016-06-19 NOTE — Telephone Encounter (Signed)
Pt returning call.Bradley [name] ° °

## 2016-06-19 NOTE — Telephone Encounter (Signed)
Dr. Silverio Decamp,  This pt is coming in for a colonoscopy on 07-03-16.  He has had his esophagus stretched in the past and is having dysphagia again.  Can he have an EGD as well as a colonoscopy?  Thanks, [employer]

## 2016-06-19 NOTE — Progress Notes (Signed)
No egg or soy allergy  No home oxygen or diet medications used  No anesthesia or intubation problems.   Pt states he has been having trouble swallowing and has been dilated previously.  I sent a TE to Dr. Silverio Decamp to se his he can have an EGD as well

## 2016-06-20 NOTE — Telephone Encounter (Signed)
Called pt and LMOM that EGD would be added on to procedures for 07/03/16.                                                                   Angela/PV

## 2016-06-20 NOTE — Telephone Encounter (Signed)
Ok to schedule egd for dysphagia. Thanks

## 2016-06-21 ENCOUNTER — Ambulatory Visit (HOSPITAL_COMMUNITY)
Admission: EM | Admit: 2016-06-21 | Discharge: 2016-06-21 | Disposition: A | Discharge status: Home / Self Care | Payer: 59 | Attending: Family Medicine | Admitting: Family Medicine

## 2016-06-21 ENCOUNTER — Encounter (HOSPITAL_COMMUNITY): Payer: Self-pay | Admitting: *Deleted

## 2016-06-21 DIAGNOSIS — S8002XA Contusion of left knee, initial encounter: Secondary | ICD-10-CM | POA: Diagnosis not present

## 2016-06-21 DIAGNOSIS — Z23 Encounter for immunization: Secondary | ICD-10-CM

## 2016-06-21 DIAGNOSIS — S8012XA Contusion of left lower leg, initial encounter: Secondary | ICD-10-CM | POA: Diagnosis not present

## 2016-06-21 MED ORDER — TETANUS-DIPHTH-ACELL PERTUSSIS 5-2.5-18.5 LF-MCG/0.5 IM SUSP
INTRAMUSCULAR | Status: AC
  Filled 2016-06-21: qty 0.5

## 2016-06-21 MED ORDER — TETANUS-DIPHTH-ACELL PERTUSSIS 5-2.5-18.5 LF-MCG/0.5 IM SUSP
0.5000 mL | Freq: Once | INTRAMUSCULAR | Status: AC
  Administered 2016-06-21: 0.5 mL via INTRAMUSCULAR

## 2016-06-21 NOTE — ED Provider Notes (Signed)
[facility]    CSN: [id number] Arrival date & time: 06/21/16  1350     History   Chief Complaint Chief Complaint  Patient presents with  . Leg Injury    HPI Bradley [name] is a 50 y.o. male.   The history is provided by the patient and the spouse.  Leg Pain  Location:  Leg Injury: yes   Mechanism of injury comment:  Struck by [employer] spring from under truck as it was falling,  Leg location:  L lower leg and R lower leg Pain details:    Quality:  Sharp   Radiates to:  Does not radiate   Severity:  Mild   Onset quality:  Sudden   Progression:  Unchanged Chronicity:  New Dislocation: no   Foreign body present:  No foreign bodies Tetanus status:  Out of date Prior injury to area:  No Relieved by:  None tried Worsened by:  Nothing Ineffective treatments:  None tried Associated symptoms: no numbness and no tingling     Past Medical History:  Diagnosis Date  . Allergy   . Blood transfusion without reported diagnosis   . Cough variant asthma 10/12/2007   no per pt 06-19-16  . Elevated LFTs   . Esophageal reflux   . GERD (gastroesophageal reflux disease)   . Hiatal hernia   . Hyperlipidemia   . Irritable bowel syndrome   . LATERAL EPICONDYLITIS 03/07/2010   Qualifier: Diagnosis of  By: Elease Hashimoto MD, Bruce  left shoulder arthrits per pt 06-19-16  . Liver lesion   . Personal history of colonic polyps 09/28/2009    ADENOMATOUS POLYP  . Schatzki's ring   . Vitamin B 12 deficiency     Patient Active Problem List   Diagnosis Date Noted  . Pulmonary nodules 06/03/2016  . SBO (small bowel obstruction) ([place]) 04/11/2016  . Hypokalemia 04/11/2016  . Total bilirubin, elevated 04/11/2016  . Insomnia 05/10/2015  . Gallstones 05/10/2015  . Essential hypertension 05/10/2015  . History of colonic polyps 10/03/2010  . B12 deficiency 09/05/2009  . IBS 08/31/2009  . ALLERGIC RHINITIS, SEASONAL 07/24/2009  . External hemorrhoids 06/07/2008  . Hyperlipidemia 05/12/2007    . GERD 05/12/2007    Past Surgical History:  Procedure Laterality Date  . COLONOSCOPY    . Exploration Laparotomy and Small Bowel resection    . LEG SURGERY     left  . LUMBAR LAMINECTOMY     x3 last one June 2017  . UPPER GASTROINTESTINAL ENDOSCOPY    . VENTRAL HERNIA REPAIR    . VIDEO BRONCHOSCOPY Bilateral 06/10/2016   Procedure: VIDEO BRONCHOSCOPY WITH FLUORO;  Surgeon: Marshell Garfinkel, MD;  Location: [facility];  Service: Cardiopulmonary;  Laterality: Bilateral;       Home Medications    Prior to Admission medications   Medication Sig Start Date End Date Taking? Authorizing Provider  atorvastatin (LIPITOR) 10 MG tablet Take 10 mg by mouth daily. 02/14/16   Historical Provider, MD  Azelastine-Fluticasone 137-50 MCG/ACT SUSP Place 1 spray into the nose 2 (two) times daily. 06/19/16   Praveen Mannam, MD  benzonatate (TESSALON) 200 MG capsule Take 1 capsule (200 mg total) by mouth 3 (three) times daily as needed for cough. 06/12/16   Praveen Mannam, MD  clonazePAM (KLONOPIN) 0.5 MG tablet Take 1 tablet (0.5 mg total) by mouth at bedtime as needed (sleep). 04/28/16   Marin Olp, MD  cyanocobalamin (,VITAMIN B-12,) 1000 MCG/ML injection Inject 1,000 mcg into the skin every  30 (thirty) days. 05/29/16   Historical Provider, MD  Dexlansoprazole 30 MG capsule Take 1 capsule (30 mg total) by mouth daily. 07/30/15   Marin Olp, MD  Na Sulfate-K Sulfate-Mg Sulf (SUPREP BOWEL PREP KIT) 17.5-3.13-1.6 GM/180ML SOLN Suprep as directed, no substitutions 06/19/16   Mauri Pole, MD  PAZEO 0.7 % SOLN Use as needed 05/29/16   Historical Provider, MD  tiZANidine (ZANAFLEX) 2 MG tablet Take 2 mg by mouth every 6 (six) hours as needed. 03/12/16   Historical Provider, MD    Family History Family History  Problem Relation Age of Onset  . Breast cancer Mother   . Thyroid cancer Mother   . Colon cancer Neg Hx   . Esophageal cancer Neg Hx   . Rectal cancer Neg Hx   . Stomach cancer Neg Hx      Social History Social History  Substance Use Topics  . Smoking status: Never Smoker  . Smokeless tobacco: Never Used  . Alcohol use Yes     Comment: occ.     Allergies   Review of patient's allergies indicates no known allergies.   Review of Systems Review of Systems  Musculoskeletal: Positive for gait problem.  Skin: Positive for wound.     Physical Exam Triage Vital Signs ED Triage Vitals  Enc Vitals Group     BP 06/21/16 1426 146/76     Pulse Rate 06/21/16 1426 69     Resp 06/21/16 1426 15     Temp 06/21/16 1426 97.5 F (36.4 C)     Temp Source 06/21/16 1426 Oral     SpO2 06/21/16 1426 98 %     Weight --      Height --      Head Circumference --      Peak Flow --      Pain Score 06/21/16 1445 6     Pain Loc --      Pain Edu? --      Excl. in [place]? --    No data found.   Updated Vital Signs BP 146/76 (BP Location: Left Arm)   Pulse 69   Temp 97.5 F (36.4 C) (Oral)   Resp 15   SpO2 98%   Visual Acuity Right Eye Distance:   Left Eye Distance:   Bilateral Distance:    Right Eye Near:   Left Eye Near:    Bilateral Near:     Physical Exam  Constitutional: He is oriented to person, place, and time. He appears well-developed and well-nourished. No distress.  Musculoskeletal: Normal range of motion. He exhibits tenderness. He exhibits no deformity.  Neurological: He is alert and oriented to person, place, and time.  Skin: Skin is warm and dry.  Nursing note and vitals reviewed.    UC Treatments / Results  Labs (all labs ordered are listed, but only abnormal results are displayed) Labs Reviewed - No data to display  EKG  EKG Interpretation None       Radiology No results found.  Procedures Procedures (including critical care time)  Medications Ordered in UC Medications  Tdap (BOOSTRIX) injection 0.5 mL (not administered)     Initial Impression / Assessment and Plan / UC Course  I have reviewed the triage vital signs and the  nursing notes.  Pertinent labs & imaging results that were available during my care of the patient were reviewed by me and considered in my medical decision making (see chart for details).  Clinical Course  Final Clinical Impressions(s) / UC Diagnoses   Final diagnoses:  None    New Prescriptions New Prescriptions   No medications on file     Billy Fischer, MD 06/21/16 1504

## 2016-06-21 NOTE — ED Triage Notes (Signed)
Pt    Reports     Was   Working     On  Truck   Today      And   And  [employer]  Striking  The  Patient     Both  Knees      And    r  Knee     The   Patient   Has  Pain in r  Knee

## 2016-06-26 ENCOUNTER — Telehealth: Payer: Self-pay | Admitting: Pulmonary Disease

## 2016-06-26 NOTE — Telephone Encounter (Signed)
PA forms have been placed in PM look at.  Will need to be signed and then faxed in once completed.  Will forward to [place] to follow up on.

## 2016-06-29 ENCOUNTER — Other Ambulatory Visit: Payer: Self-pay | Admitting: Family Medicine

## 2016-07-03 ENCOUNTER — Ambulatory Visit (AMBULATORY_SURGERY_CENTER): Payer: 59 | Admitting: Gastroenterology

## 2016-07-03 ENCOUNTER — Encounter: Payer: Self-pay | Admitting: Gastroenterology

## 2016-07-03 VITALS — BP 123/79 | HR 66 | Temp 98.6°F | Resp 13 | Ht 71.0 in | Wt 191.0 lb

## 2016-07-03 DIAGNOSIS — Z86010 Personal history of colonic polyps: Secondary | ICD-10-CM | POA: Diagnosis present

## 2016-07-03 DIAGNOSIS — D124 Benign neoplasm of descending colon: Secondary | ICD-10-CM

## 2016-07-03 DIAGNOSIS — D120 Benign neoplasm of cecum: Secondary | ICD-10-CM

## 2016-07-03 DIAGNOSIS — D122 Benign neoplasm of ascending colon: Secondary | ICD-10-CM

## 2016-07-03 DIAGNOSIS — R1310 Dysphagia, unspecified: Secondary | ICD-10-CM

## 2016-07-03 DIAGNOSIS — D123 Benign neoplasm of transverse colon: Secondary | ICD-10-CM | POA: Diagnosis not present

## 2016-07-03 DIAGNOSIS — Z1211 Encounter for screening for malignant neoplasm of colon: Secondary | ICD-10-CM

## 2016-07-03 HISTORY — PX: COLONOSCOPY: SHX174

## 2016-07-03 HISTORY — PX: UPPER GASTROINTESTINAL ENDOSCOPY: SHX188

## 2016-07-03 MED ORDER — SODIUM CHLORIDE 0.9 % IV SOLN: 500.0000 mL | INTRAVENOUS | Status: DC

## 2016-07-03 NOTE — Op Note (Signed)
[place] Patient Name: Bradley [name] Procedure Date: 07/03/2016 1:27 PM MRN: [id number] Endoscopist: Mauri Pole , MD Age: 50 Referring MD:  Date of Birth: 1965-10-22 Gender: Male Account #: [id number] Procedure:                Colonoscopy Indications:              Surveillance: Personal history of adenomatous                            polyps on last colonoscopy > 5 years ago Medicines:                Monitored Anesthesia Care Procedure:                Pre-Anesthesia Assessment:                           - Prior to the procedure, a History and Physical                            was performed, and patient medications and                            allergies were reviewed. The patient's tolerance of                            previous anesthesia was also reviewed. The risks                            and benefits of the procedure and the sedation                            options and risks were discussed with the patient.                            All questions were answered, and informed consent                            was obtained. Prior Anticoagulants: The patient has                            taken no previous anticoagulant or antiplatelet                            agents. ASA Grade Assessment: II - A patient with                            mild systemic disease. After reviewing the risks                            and benefits, the patient was deemed in                            satisfactory condition to undergo the procedure.                           -  Prior to the procedure, a History and Physical                            was performed, and patient medications and                            allergies were reviewed. The patient's tolerance of                            previous anesthesia was also reviewed. The risks                            and benefits of the procedure and the sedation                            options and risks were discussed  with the patient.                            All questions were answered, and informed consent                            was obtained. Prior Anticoagulants: The patient has                            taken no previous anticoagulant or antiplatelet                            agents. ASA Grade Assessment: II - A patient with                            mild systemic disease. After reviewing the risks                            and benefits, the patient was deemed in                            satisfactory condition to undergo the procedure.                           After obtaining informed consent, the colonoscope                            was passed under direct vision. Throughout the                            procedure, the patient's blood pressure, pulse, and                            oxygen saturations were monitored continuously. The                            Model CF-HQ190L [id number]) scope was introduced  through the anus and advanced to the the cecum,                            identified by appendiceal orifice and ileocecal                            valve. The colonoscopy was performed without                            difficulty. The patient tolerated the procedure                            well. The quality of the bowel preparation was                            excellent. The ileocecal valve, appendiceal                            orifice, and rectum were photographed. Scope In: 1:28:23 PM Scope Out: 1:50:16 PM Scope Withdrawal Time: 0 hours 12 minutes 16 seconds  Total Procedure Duration: 0 hours 21 minutes 53 seconds  Findings:                 Four sessile polyps were found in the descending                            colon, transverse colon and ascending colon. The                            polyps were 4 to 7 mm in size. These polyps were                            removed with a cold snare. Resection and retrieval                             were complete.                           A 2 mm polyp was found in the cecum. The polyp was                            sessile. The polyp was removed with a cold biopsy                            forceps. Resection and retrieval were complete.                           The exam was otherwise without abnormality. Complications:            No immediate complications. Estimated Blood Loss:     Estimated blood loss was minimal. Impression:               - Four 4 to 7 mm polyps in the descending colon, in  the transverse colon and in the ascending colon,                            removed with a cold snare. Resected and retrieved.                           - One 2 mm polyp in the cecum, removed with a cold                            biopsy forceps. Resected and retrieved.                           - The examination was otherwise normal. Recommendation:           - Patient has a contact number available for                            emergencies. The signs and symptoms of potential                            delayed complications were discussed with the                            patient. Return to normal activities tomorrow.                            Written discharge instructions were provided to the                            patient.                           - Resume previous diet.                           - Continue present medications.                           - Await pathology results.                           - Repeat colonoscopy in 3 - 5 years for                            surveillance based on pathology results. Mauri Pole, MD 07/03/2016 2:05:45 PM This report has been signed electronically.

## 2016-07-03 NOTE — Progress Notes (Signed)
Called to room to assist during endoscopic procedure.  Patient ID and intended procedure confirmed with present staff. Received instructions for my participation in the procedure from the performing physician.  

## 2016-07-03 NOTE — Progress Notes (Signed)
To recovery vss report to [facility]

## 2016-07-03 NOTE — Patient Instructions (Signed)
Impression/Recommendations:  Polyps handout given.  Repeat colonoscopy in 3-5 years based on pathology results.YOU HAD AN ENDOSCOPIC PROCEDURE TODAY AT [facility]:   Refer to the procedure report that was given to you for any specific questions about what was found during the examination.  If the procedure report does not answer your questions, please call your gastroenterologist to clarify.  If you requested that your care partner not be given the details of your procedure findings, then the procedure report has been included in a sealed envelope for you to review at your convenience later.  YOU SHOULD EXPECT: Some feelings of bloating in the abdomen. Passage of more gas than usual.  Walking can help get rid of the air that was put into your GI tract during the procedure and reduce the bloating. If you had a lower endoscopy (such as a colonoscopy or flexible sigmoidoscopy) you may notice spotting of blood in your stool or on the toilet paper. If you underwent a bowel prep for your procedure, you may not have a normal bowel movement for a few days.  Please Note:  You might notice some irritation and congestion in your nose or some drainage.  This is from the oxygen used during your procedure.  There is no need for concern and it should clear up in a day or so.  SYMPTOMS TO REPORT IMMEDIATELY:   Following lower endoscopy (colonoscopy or flexible sigmoidoscopy):  Excessive amounts of blood in the stool  Significant tenderness or worsening of abdominal pains  Swelling of the abdomen that is new, acute  Fever of 100F or higher   Following upper endoscopy (EGD)  Vomiting of blood or coffee ground material  New chest pain or pain under the shoulder blades  Painful or persistently difficult swallowing  New shortness of breath  Fever of 100F or higher  Black, tarry-looking stools  For urgent or emergent issues, a gastroenterologist can be reached at any hour by calling [phone]  [phone].   DIET:  We do recommend a small meal at first, but then you may proceed to your regular diet.  Drink plenty of fluids but you should avoid alcoholic beverages for 24 hours.  ACTIVITY:  You should plan to take it easy for the rest of today and you should NOT DRIVE or use heavy machinery until tomorrow (because of the sedation medicines used during the test).    FOLLOW UP: Our staff will call the number listed on your records the next business day following your procedure to check on you and address any questions or concerns that you may have regarding the information given to you following your procedure. If we do not reach you, we will leave a message.  However, if you are feeling well and you are not experiencing any problems, there is no need to return our call.  We will assume that you have returned to your regular daily activities without incident.  If any biopsies were taken you will be contacted by phone or by letter within the next 1-3 weeks.  Please call us at [phone] if you have not heard about the biopsies in 3 weeks.    SIGNATURES/CONFIDENTIALITY: You and/or your care partner have signed paperwork which will be entered into your electronic medical record.  These signatures attest to the fact that that the information above on your After Visit Summary has been reviewed and is understood.  Full responsibility of the confidentiality of this discharge information lies with you and/or  your care-partner. 

## 2016-07-03 NOTE — Op Note (Signed)
[name]: Bradley [name] Procedure Date: 07/03/2016 1:13 PM MRN: [id number] Endoscopist: Mauri Pole , MD Age: 50 Referring MD:  Date of Birth: 1965-12-12 Gender: Male Account #: [id number] Procedure:                Upper GI endoscopy Indications:              Dysphagia Medicines:                Monitored Anesthesia Care Procedure:                Pre-Anesthesia Assessment:                           - Prior to the procedure, a History and Physical                            was performed, and patient medications and                            allergies were reviewed. The patient's tolerance of                            previous anesthesia was also reviewed. The risks                            and benefits of the procedure and the sedation                            options and risks were discussed with the patient.                            All questions were answered, and informed consent                            was obtained. Prior Anticoagulants: The patient has                            taken no previous anticoagulant or antiplatelet                            agents. ASA Grade Assessment: II - A patient with                            mild systemic disease. After reviewing the risks                            and benefits, the patient was deemed in                            satisfactory condition to undergo the procedure.                           After obtaining informed consent, the endoscope was  passed under direct vision. Throughout the                            procedure, the patient's blood pressure, pulse, and                            oxygen saturations were monitored continuously. The                            Model GIF-HQ190 [id number]) scope was introduced                            through the mouth, and advanced to the second part                            of duodenum. The upper GI endoscopy was                           accomplished without difficulty. The patient                            tolerated the procedure well. Scope In: Scope Out: Findings:                 One mild (non-circumferential scarring)                            benign-appearing, intrinsic stenosis was found 37                            to 38 cm from the incisors. This measured 2 cm                            (inner diameter) x less than one cm (in length) and                            was traversed. Biopsies were obtained from the                            proximal and distal esophagus with cold forceps for                            histology of suspected eosinophilic esophagitis.                           The stomach was normal.                           The examined duodenum was normal. Complications:            No immediate complications. Estimated Blood Loss:     Estimated blood loss was minimal. Impression:               - Benign-appearing esophageal stenosis. Biopsied.                           -  Normal stomach.                           - Normal examined duodenum. Recommendation:           - Patient has a contact number available for                            emergencies. The signs and symptoms of potential                            delayed complications were discussed with the                            patient. Return to normal activities tomorrow.                            Written discharge instructions were provided to the                            patient.                           - Resume previous diet.                           - Continue present medications.                           - Await pathology results. Mauri Pole, MD 07/03/2016 2:03:29 PM This report has been signed electronically.

## 2016-07-04 ENCOUNTER — Telehealth: Payer: Self-pay | Admitting: *Deleted

## 2016-07-04 NOTE — Telephone Encounter (Signed)
  Follow up Call-  Call back number 07/03/2016  Post procedure Call Back phone  # [phone]  Permission to leave phone message Yes  Some recent data might be hidden     Patient questions:  Do you have a fever, pain , or abdominal swelling? No. Pain Score  0 *  Have you tolerated food without any problems? Yes.    Have you been able to return to your normal activities? Yes.    Do you have any questions about your discharge instructions: Diet   No. Medications  No. Follow up visit  No.  Do you have questions or concerns about your Care? No.  Actions: * If pain score is 4 or above: No action needed, pain <4.

## 2016-07-08 NOTE — Telephone Encounter (Signed)
Have these forms been signed? Thanks.

## 2016-07-09 LAB — FUNGAL ORGANISM REFLEX

## 2016-07-09 LAB — FUNGUS CULTURE RESULT

## 2016-07-09 LAB — FUNGUS CULTURE WITH STAIN

## 2016-07-09 NOTE — Telephone Encounter (Signed)
Received form back from PM: please change to generic Flonase and Astelin  LMOM TCB x1 for pt to discuss

## 2016-07-10 NOTE — Telephone Encounter (Signed)
lmtcb x2 for pt. 

## 2016-07-14 ENCOUNTER — Encounter: Payer: Self-pay | Admitting: Gastroenterology

## 2016-07-14 MED ORDER — FLUTICASONE PROPIONATE 50 MCG/ACT NA SUSP: 2.0000 | Freq: Every day | NASAL | 3 refills | Status: DC

## 2016-07-14 MED ORDER — AZELASTINE HCL 0.1 % NA SOLN
2.0000 | Freq: Two times a day (BID) | NASAL | 3 refills | Status: DC
Start: 2016-07-14 — End: 2018-06-24

## 2016-07-14 NOTE — Telephone Encounter (Signed)
lmtcb x2 for pt. 

## 2016-07-14 NOTE — Telephone Encounter (Signed)
Pt callback, phone # was verified.Bradley [name]

## 2016-07-14 NOTE — Telephone Encounter (Signed)
Spoke with pt.  He is ok with changing to generic flonase and astelin to replace Dymista that is not covered.  Rx sent to pharmacy.

## 2016-07-22 ENCOUNTER — Other Ambulatory Visit (INDEPENDENT_AMBULATORY_CARE_PROVIDER_SITE_OTHER): Payer: 59

## 2016-07-22 DIAGNOSIS — E785 Hyperlipidemia, unspecified: Secondary | ICD-10-CM | POA: Diagnosis not present

## 2016-07-22 LAB — COMPREHENSIVE METABOLIC PANEL
ALBUMIN: 4.4 g/dL (ref 3.5–5.2)
ALK PHOS: 81 U/L (ref 39–117)
ALT: 37 U/L (ref 0–53)
AST: 23 U/L (ref 0–37)
BUN: 13 mg/dL (ref 6–23)
CALCIUM: 9.6 mg/dL (ref 8.4–10.5)
CHLORIDE: 104 meq/L (ref 96–112)
CO2: 28 mEq/L (ref 19–32)
Creatinine, Ser: 0.93 mg/dL (ref 0.40–1.50)
GFR: 90.71 mL/min (ref >60.00)
Glucose, Bld: 98 mg/dL (ref 70–99)
POTASSIUM: 3.9 meq/L (ref 3.5–5.1)
Sodium: 140 mEq/L (ref 135–145)
TOTAL PROTEIN: 6.8 g/dL (ref 6.0–8.3)
Total Bilirubin: 0.7 mg/dL (ref 0.2–1.2)

## 2016-07-22 LAB — LDL CHOLESTEROL, DIRECT: Direct LDL: 145 mg/dL

## 2016-07-24 LAB — ACID FAST CULTURE WITH REFLEXED SENSITIVITIES: ACID FAST CULTURE - AFSCU3: NEGATIVE

## 2016-07-24 LAB — ACID FAST CULTURE WITH REFLEXED SENSITIVITIES (MYCOBACTERIA): Acid Fast Culture: NEGATIVE

## 2016-07-29 ENCOUNTER — Ambulatory Visit (INDEPENDENT_AMBULATORY_CARE_PROVIDER_SITE_OTHER): Payer: 59 | Admitting: Family Medicine

## 2016-07-29 ENCOUNTER — Encounter: Payer: Self-pay | Admitting: Family Medicine

## 2016-07-29 VITALS — BP 132/86 | HR 80 | Temp 98.2°F | Wt 192.4 lb

## 2016-07-29 DIAGNOSIS — Z0000 Encounter for general adult medical examination without abnormal findings: Secondary | ICD-10-CM

## 2016-07-29 NOTE — Progress Notes (Signed)
Pre visit review using our clinic review tool, if applicable. No additional management support is needed unless otherwise documented below in the visit note. 

## 2016-07-29 NOTE — Patient Instructions (Addendum)
Let's work on trimming off 10 lbs on our scales in the next year Wt Readings from Last 3 Encounters:  07/29/16 192 lb 6.4 oz (87.3 kg)  07/03/16 191 lb (86.6 kg)  06/19/16 191 lb 9.6 oz (86.9 kg)   6-12 month follow up  Hopeful for good report on follow up CT  Let me know if knee doesn't continue to improve over next month  Next years labs Health Maintenance Due  Topic Date Due  . HIV Screening  08/17/1979

## 2016-07-29 NOTE — Progress Notes (Signed)
Phone: [phone]  Subjective:  Patient presents today for their annual physical. Chief complaint-noted.   See problem oriented charting- ROS- full  review of systems was completed and negative except for: mild cough which has improved on chlorpheniramine  The following were reviewed and entered/updated in epic: Past Medical History:  Diagnosis Date  . Allergy   . Blood transfusion without reported diagnosis   . Cough variant asthma 10/12/2007   no per pt 06-19-16  . Elevated LFTs   . Esophageal reflux   . GERD (gastroesophageal reflux disease)   . Hiatal hernia   . Hyperlipidemia   . Irritable bowel syndrome   . LATERAL EPICONDYLITIS 03/07/2010   Qualifier: Diagnosis of  By: Elease Hashimoto MD, Bruce  left shoulder arthrits per pt 06-19-16  . Liver lesion   . Personal history of colonic polyps 09/28/2009    ADENOMATOUS POLYP  . Schatzki's ring   . Vitamin B 12 deficiency    Patient Active Problem List   Diagnosis Date Noted  . Insomnia 05/10/2015    Priority: Medium  . Essential hypertension 05/10/2015    Priority: Medium  . Hyperlipidemia 05/12/2007    Priority: Medium  . GERD 05/12/2007    Priority: Medium  . Gallstones 05/10/2015    Priority: Low  . History of colonic polyps 10/03/2010    Priority: Low  . B12 deficiency 09/05/2009    Priority: Low  . IBS 08/31/2009    Priority: Low  . ALLERGIC RHINITIS, SEASONAL 07/24/2009    Priority: Low  . External hemorrhoids 06/07/2008    Priority: Low  . Pulmonary nodules 06/03/2016  . SBO (small bowel obstruction) 04/11/2016  . Hypokalemia 04/11/2016  . Total bilirubin, elevated 04/11/2016   Past Surgical History:  Procedure Laterality Date  . COLONOSCOPY    . Exploration Laparotomy and Small Bowel resection    . LEG SURGERY     left  . LUMBAR LAMINECTOMY     x3 last one June 2017  . UPPER GASTROINTESTINAL ENDOSCOPY    . VENTRAL HERNIA REPAIR    . VIDEO BRONCHOSCOPY Bilateral 06/10/2016   Procedure: VIDEO  BRONCHOSCOPY WITH FLUORO;  Surgeon: Marshell Garfinkel, MD;  Location: [facility];  Service: Cardiopulmonary;  Laterality: Bilateral;    Family History  Problem Relation Age of Onset  . Breast cancer Mother   . Thyroid cancer Mother   . Colon cancer Neg Hx   . Esophageal cancer Neg Hx   . Rectal cancer Neg Hx   . Stomach cancer Neg Hx     Medications- reviewed and updated Current Outpatient Prescriptions  Medication Sig Dispense Refill  . atorvastatin (LIPITOR) 10 MG tablet Take 10 mg by mouth daily.  3  . azelastine (ASTELIN) 0.1 % nasal spray Place 2 sprays into both nostrils 2 (two) times daily. Use in each nostril as directed 30 mL 3  . clonazePAM (KLONOPIN) 0.5 MG tablet Take 1 tablet (0.5 mg total) by mouth at bedtime as needed (sleep). 30 tablet 5  . cyanocobalamin (,VITAMIN B-12,) 1000 MCG/ML injection Inject 1,000 mcg into the skin every 30 (thirty) days.    Marland Kitchen DEXILANT 30 MG capsule TAKE 1 CAPSULE (30 MG TOTAL) BY MOUTH DAILY. 90 capsule 3  . fluticasone (FLONASE) 50 MCG/ACT nasal spray Place 2 sprays into both nostrils daily. 16 g 3  . PAZEO 0.7 % SOLN Use as needed     Current Facility-Administered Medications  Medication Dose Route Frequency Provider Last Rate Last Dose  . 0.9 %  sodium chloride infusion  500 mL Intravenous Continuous Mauri Pole, MD        Allergies-reviewed and updated No Known Allergies  Social History   Social History  . Marital status: Married    Spouse name: N/A  . Number of children: 2  . Years of education: N/A   Occupational History  . ELECTRICIAN [employer]   Social History Main Topics  . Smoking status: Never Smoker  . Smokeless tobacco: Never Used  . Alcohol use Yes     Comment: occ.  . Drug use: No  . Sexual activity: Not Asked   Other Topics Concern  . None   Social History Narrative   Married, lives with wife and son   OCCUPATION: Physiological scientist     Objective: BP 132/86   Pulse 80   Temp 98.2 F (36.8 C)  (Oral)   Wt 192 lb 6.4 oz (87.3 kg)   SpO2 96%   BMI 26.83 kg/m  Gen: NAD, resting comfortably HEENT: Mucous membranes are moist. Oropharynx normal CV: RRR no murmurs rubs or gallops Lungs: CTAB no crackles, wheeze, rhonchi Abdomen: soft/nontender/nondistended/normal bowel sounds. No rebound or guarding.  Ext: no edema Skin: warm, dry Neuro: grossly normal, moves all extremities, PERRLA Rectal: normal tone, normal sized prostate, no masses or tenderness   Assessment/Plan:  50 y.o. male presenting for annual physical.  Health Maintenance counseling: 1. Anticipatory guidance: Patient counseled regarding regular dental exams, eye exams, wearing seatbelts.  2. Risk factor reduction:  Advised patient of need for regular exercise and diet rich and fruits and vegetables to reduce risk of heart attack and stroke. Minimal exercise- advise 150 minutes a week. Goal 10-15 lbs in the next year weight loss 3. Immunizations/screenings/ancillary studies Immunization History  Administered Date(s) Administered  . Influenza Split 07/14/2011  . Influenza,inj,Quad PF,36+ Mos 06/27/2013, 07/30/2015, 06/17/2016  . Td 06/23/1999, 08/25/2008  . Tdap 06/21/2016   Health Maintenance Due  Topic Date Due  . HIV Screening - next year 08/17/1979   4. Prostate cancer screening- low risk rectal- PSA was not drawn on labs- encouraged to have this done next year as prefers to do testing- friend had prostate cancer   5. Colon cancer screening - done 07/03/16 and with 5 year repeat given 5 adenomas  Status of chronic or acute concerns  Hyperlipidemia- poor control but improved from prior checks up to 191 with LDL of 145 on atorvastatin 10mg  twice a week, bump to 4x a week  GERD_ controlled with dexilant 30mg  down from 60mg  overall- rarely can outeat the 30mg   Allergies- reasonable control on flonase and astelin nasal spray. Also on chlorpheniramine. Rarely using atelin- horrible taste. clorpheniramine has really  helped.  Insomnia- controlled on klonopin 0.5mg  as needed. No refil needed  HTN- controlled with hctz 25mg   BP Readings from Last 3 Encounters:  07/29/16 132/86  07/03/16 123/79  06/21/16 146/76   Following with pulmonology for possible sarcoid- has CT follow up later this year planned. Chronic cough thought likely allergy related doing better on chlorpheniramine   Left knee pain after contusion a month ago- pain in quadricep- may have small tear- discussed considering sports medicine if not better in another month  6-12 months  Return precautions advised.   Garret Reddish, MD

## 2016-08-13 ENCOUNTER — Ambulatory Visit (HOSPITAL_COMMUNITY)
Admission: RE | Admit: 2016-08-13 | Discharge: 2016-08-13 | Disposition: A | Discharge status: Home / Self Care | Payer: 59 | Source: Ambulatory Visit | Attending: Pulmonary Disease | Admitting: Pulmonary Disease

## 2016-08-13 DIAGNOSIS — R590 Localized enlarged lymph nodes: Secondary | ICD-10-CM | POA: Diagnosis not present

## 2016-08-13 DIAGNOSIS — K769 Liver disease, unspecified: Secondary | ICD-10-CM | POA: Insufficient documentation

## 2016-08-13 DIAGNOSIS — R918 Other nonspecific abnormal finding of lung field: Secondary | ICD-10-CM

## 2016-08-13 DIAGNOSIS — I2510 Atherosclerotic heart disease of native coronary artery without angina pectoris: Secondary | ICD-10-CM | POA: Diagnosis not present

## 2016-08-13 DIAGNOSIS — K8020 Calculus of gallbladder without cholecystitis without obstruction: Secondary | ICD-10-CM | POA: Insufficient documentation

## 2016-08-14 ENCOUNTER — Other Ambulatory Visit: Payer: Self-pay | Admitting: Family Medicine

## 2016-08-18 ENCOUNTER — Ambulatory Visit (INDEPENDENT_AMBULATORY_CARE_PROVIDER_SITE_OTHER): Payer: 59 | Admitting: Pulmonary Disease

## 2016-08-18 ENCOUNTER — Encounter: Payer: Self-pay | Admitting: Pulmonary Disease

## 2016-08-18 VITALS — BP 128/90 | HR 88 | Ht 71.0 in | Wt 198.6 lb

## 2016-08-18 DIAGNOSIS — R918 Other nonspecific abnormal finding of lung field: Secondary | ICD-10-CM

## 2016-08-18 NOTE — Progress Notes (Signed)
Bradley [name]    [id number]    June 22, 1966  Primary Care Physician:Stephen Yong Channel, MD  Referring Physician: Marin Olp, MD [address] [place], [place] [address]  Chief complaint:  Follow up for pulmonary nodules  HPI: Bradley [name] is a 50 year old seen earlier this month in evaluation for pulmonary nodules, lymphadenopathy. Here his surgery in June 2017 and developed ileus. A CT scan abdomen showed paraesophageal lymph node. This was followed by CT of the chest which showed mediastinal, hilar lymphadenopathy with numerous pulmonary nodules suspicious for sarcoidosis. Patient underwent a bronchoscopic transbronchial biopsies on September 19. The results reviewed below-significant for MSSA in BAL, elevated CD4: CD8 ratio. Path is negative for granulomas or malignancy.   Patient is complains of chronic cough, rhinitis, postnasal drip. He has significant issues with GERD and is on dexilant PPI. He's had a few episodes of minor hemoptysis last week post-bronchoscopy which has resolved spontaneously. He is currently finishing a course of Levaquin for MSSA in BAL. He denies any dyspnea, sputum production, wheezing episodes, chest pain, palpitation, fevers, chills.  Outpatient Encounter Prescriptions as of 08/18/2016  Medication Sig  . atorvastatin (LIPITOR) 10 MG tablet TAKE 1 TABLET (10 MG TOTAL) BY MOUTH DAILY.  Marland Kitchen azelastine (ASTELIN) 0.1 % nasal spray Place 2 sprays into both nostrils 2 (two) times daily. Use in each nostril as directed  . clonazePAM (KLONOPIN) 0.5 MG tablet Take 1 tablet (0.5 mg total) by mouth at bedtime as needed (sleep).  . cyanocobalamin (,VITAMIN B-12,) 1000 MCG/ML injection Inject 1,000 mcg into the skin every 30 (thirty) days.  Marland Kitchen DEXILANT 30 MG capsule TAKE 1 CAPSULE (30 MG TOTAL) BY MOUTH DAILY.  . fluticasone (FLONASE) 50 MCG/ACT nasal spray Place 2 sprays into both nostrils daily.  Marland Kitchen PAZEO 0.7 % SOLN Use as needed  . [DISCONTINUED] atorvastatin  (LIPITOR) 10 MG tablet Take 10 mg by mouth daily.   Facility-Administered Encounter Medications as of 08/18/2016  Medication  . 0.9 %  sodium chloride infusion    Allergies as of 08/18/2016  . (No Known Allergies)    Past Medical History:  Diagnosis Date  . Allergy   . Blood transfusion without reported diagnosis   . Cough variant asthma 10/12/2007   no per pt 06-19-16  . Elevated LFTs   . Esophageal reflux   . GERD (gastroesophageal reflux disease)   . Hiatal hernia   . Hyperlipidemia   . Irritable bowel syndrome   . LATERAL EPICONDYLITIS 03/07/2010   Qualifier: Diagnosis of  By: Elease Hashimoto MD, Bruce  left shoulder arthrits per pt 06-19-16  . Liver lesion   . Personal history of colonic polyps 09/28/2009    ADENOMATOUS POLYP  . Schatzki's ring   . Vitamin B 12 deficiency     Past Surgical History:  Procedure Laterality Date  . COLONOSCOPY    . Exploration Laparotomy and Small Bowel resection    . LEG SURGERY     left  . LUMBAR LAMINECTOMY     x3 last one June 2017  . UPPER GASTROINTESTINAL ENDOSCOPY    . VENTRAL HERNIA REPAIR    . VIDEO BRONCHOSCOPY Bilateral 06/10/2016   Procedure: VIDEO BRONCHOSCOPY WITH FLUORO;  Surgeon: Marshell Garfinkel, MD;  Location: [facility];  Service: Cardiopulmonary;  Laterality: Bilateral;    Family History  Problem Relation Age of Onset  . Breast cancer Mother   . Thyroid cancer Mother   . Colon cancer Neg Hx   .  Esophageal cancer Neg Hx   . Rectal cancer Neg Hx   . Stomach cancer Neg Hx     Social History   Social History  . Marital status: Married    Spouse name: N/A  . Number of children: 2  . Years of education: N/A   Occupational History  . ELECTRICIAN [employer]   Social History Main Topics  . Smoking status: Never Smoker  . Smokeless tobacco: Never Used  . Alcohol use Yes     Comment: occ.  . Drug use: No  . Sexual activity: Not on file   Other Topics Concern  . Not on file   Social History Narrative   Married,  lives with wife and son   OCCUPATION: Physiological scientist      Review of systems: Review of Systems  Constitutional: Negative for fever and chills.  HENT: Negative.   Eyes: Negative for blurred vision.  Respiratory: as per HPI  Cardiovascular: Negative for chest pain and palpitations.  Gastrointestinal: Negative for vomiting, diarrhea, blood per rectum. Genitourinary: Negative for dysuria, urgency, frequency and hematuria.  Musculoskeletal: Negative for myalgias, back pain and joint pain.  Skin: Negative for itching and rash.  Neurological: Negative for dizziness, tremors, focal weakness, seizures and loss of consciousness.  Endo/Heme/Allergies: Negative for environmental allergies.  Psychiatric/Behavioral: Negative for depression, suicidal ideas and hallucinations.  All other systems reviewed and are negative.   Physical Exam: Blood pressure 122/76, pulse 77, weight 191 lb 6.4 oz (86.8 kg), SpO2 98 %. Gen:      No acute distress HEENT:  EOMI, sclera anicteric Neck:     No masses; no thyromegaly Lungs:    Clear to auscultation bilaterally; normal respiratory effort CV:         Regular rate and rhythm; no murmurs Abd:      + bowel sounds; soft, non-tender; no palpable masses, no distension Ext:    No edema; adequate peripheral perfusion Skin:      Warm and dry; no rash Neuro: alert and oriented x 3 Psych: normal mood and affect  Data Reviewed: CT chest 05/05/16- mediastinal, hilar lymphadenopathy. Numerous subcentimeter pulmonary nodules. Images reviewed. CT chest done/22/17-improved mediastinal, hilar lymphadenopathy. Subcentimeter pulmonary nodules slightly increased in number. Images reviewed.  Bronchoscopy with BAL, transbronchial biopsy 06/10/16 BAL cell count- WBC 76, 85% lymph, 9% neutrophils CD4:CD8 ratio- 6.5:1 Micro- MSSA Path- Benign lung tissue Cytology- benign  Serologies 06/03/16 Met panel, CBC- Normal CRP 0.2, Sed rate 2. ACE- 40 (neg) CCP, RA-  Neg  Assessment:  #1 Multiple pulmonary nodules, mediastinal, hilar lymphadenopathy. Although the pattern is suspicious for sarcoid he has a negative ACE levels and transbronchial biopsies. His CD4: CD8 ratio is elevated but this is a nonspecific indicator of sarcoidosis. We reviewed his repeat CT scan. He does have persistent pulmonary nodules slightly increased in number but the lymphadenopathy is better. This is likely a benign process. We'll follow-up with a CT in 6 months. If the lymphadenopathy and pulmonary nodules worsen then we can consdier an EBUS or mediastinoscopy for further evaluation.   #2 Chronic cough This may be related to the above. It is likely his postnasal drip and GERD symptoms are playing a significant role in the cough. He will continue on antihistamine, chlorpheniramine and dymista nasal spray. He continues the dexilant for GERD.  Plan/Recommendations: - Repeat CT chest  - Continue chlorpheniramine and dymista - Continue dexilant for GERD.  Marshell Garfinkel MD [facility] Pager [phone] 08/18/2016,  9:11 AM  CC: Marin Olp, MD   Addendum: Patient called back and wants to proceed with biopsy sooner. Risks and benefits reviewed with the patient. We will proceed with bronchoscopy with EBUS.  Marshell Garfinkel MD [facility] Pager [phone] If no answer or after 3pm call: [phone] 09/08/2016, 12:11 AM

## 2016-08-18 NOTE — Patient Instructions (Signed)
We will schedule for CT scan of chest without contrast in 6 months Return to clinic after scan to review.

## 2016-08-29 ENCOUNTER — Telehealth: Payer: Self-pay | Admitting: Pulmonary Disease

## 2016-08-29 DIAGNOSIS — R918 Other nonspecific abnormal finding of lung field: Secondary | ICD-10-CM

## 2016-08-29 NOTE — Telephone Encounter (Signed)
Called and spoke with pt and he stated that he did not want to wait 6 months and wanted to go ahead and schedule the biopsy.  PM please advise. thanks

## 2016-08-30 NOTE — Telephone Encounter (Signed)
I will call him on Monday to discuss. Thanks 

## 2016-09-01 NOTE — Telephone Encounter (Signed)
Spoke with patient, aware that we will let Dr Vaughan Browner know that he returned his call and we will have PM give him a call back as soon as he can. PM please advise. Thanks.

## 2016-09-01 NOTE — Telephone Encounter (Signed)
Lmtcb.

## 2016-09-01 NOTE — Telephone Encounter (Signed)
Patient is returning phone call. Patient is at work. Please leave a detailed voicemail.

## 2016-09-01 NOTE — Telephone Encounter (Signed)
Pt aware of below message & voiced understanding.  PM-just a friendly reminder to contact pt today. thanks

## 2016-09-02 NOTE — Telephone Encounter (Signed)
PM has spoken with patient Per PM: please order EBUS for 12/18 or 12/19, early morning.  [facility] preferably.  Generally anesthesia.  Order placed.

## 2016-09-05 ENCOUNTER — Encounter (HOSPITAL_COMMUNITY): Payer: Self-pay | Admitting: *Deleted

## 2016-09-05 NOTE — Progress Notes (Signed)
Pt denies SOB, chest pain, and being under the care of a cardiologist. Pt denies having a stress test, echo and cardiac cath. Pt denies having an EKG within the last year. Pt denies having recent labs. Pt made aware to stop taking Aspirin, vitamins. fish oil and herbal medications. Do not take any NSAIDs ie: Ibuprofen, Advil, Naproxen, BC and Goody Powder or any medication containing Aspirin. Pt verbalized understanding of all pre-op instructions.

## 2016-09-08 ENCOUNTER — Ambulatory Visit (HOSPITAL_COMMUNITY)
Admission: RE | Admit: 2016-09-08 | Discharge: 2016-09-08 | Disposition: A | Discharge status: Home / Self Care | Payer: 59 | Source: Ambulatory Visit | Attending: Pulmonary Disease | Admitting: Pulmonary Disease

## 2016-09-08 ENCOUNTER — Encounter (HOSPITAL_COMMUNITY): Payer: Self-pay | Admitting: Anesthesiology

## 2016-09-08 ENCOUNTER — Encounter (HOSPITAL_COMMUNITY)
Admission: RE | Disposition: A | Discharge status: Home / Self Care | Payer: Self-pay | Source: Ambulatory Visit | Attending: Pulmonary Disease

## 2016-09-08 ENCOUNTER — Ambulatory Visit (HOSPITAL_COMMUNITY): Payer: 59 | Admitting: Anesthesiology

## 2016-09-08 ENCOUNTER — Ambulatory Visit (HOSPITAL_COMMUNITY): Payer: 59

## 2016-09-08 DIAGNOSIS — M1990 Unspecified osteoarthritis, unspecified site: Secondary | ICD-10-CM | POA: Insufficient documentation

## 2016-09-08 DIAGNOSIS — K449 Diaphragmatic hernia without obstruction or gangrene: Secondary | ICD-10-CM | POA: Insufficient documentation

## 2016-09-08 DIAGNOSIS — K219 Gastro-esophageal reflux disease without esophagitis: Secondary | ICD-10-CM | POA: Insufficient documentation

## 2016-09-08 DIAGNOSIS — Z86010 Personal history of colonic polyps: Secondary | ICD-10-CM

## 2016-09-08 DIAGNOSIS — R918 Other nonspecific abnormal finding of lung field: Secondary | ICD-10-CM | POA: Insufficient documentation

## 2016-09-08 DIAGNOSIS — R1310 Dysphagia, unspecified: Secondary | ICD-10-CM

## 2016-09-08 DIAGNOSIS — J45909 Unspecified asthma, uncomplicated: Secondary | ICD-10-CM | POA: Insufficient documentation

## 2016-09-08 DIAGNOSIS — I10 Essential (primary) hypertension: Secondary | ICD-10-CM | POA: Diagnosis not present

## 2016-09-08 DIAGNOSIS — T819XXA Unspecified complication of procedure, initial encounter: Secondary | ICD-10-CM

## 2016-09-08 HISTORY — DX: Other specified postprocedural states: Z98.890

## 2016-09-08 HISTORY — DX: Other nonspecific abnormal finding of lung field: R91.8

## 2016-09-08 HISTORY — PX: VIDEO BRONCHOSCOPY WITH ENDOBRONCHIAL ULTRASOUND: SHX6177

## 2016-09-08 HISTORY — DX: Nausea with vomiting, unspecified: R11.2

## 2016-09-08 LAB — CBC
HCT: 44.1 % (ref 39.0–52.0)
Hemoglobin: 15.4 g/dL (ref 13.0–17.0)
MCH: 30.4 pg (ref 26.0–34.0)
MCHC: 34.9 g/dL (ref 30.0–36.0)
MCV: 87.2 fL (ref 78.0–100.0)
PLATELETS: 199 10*3/uL (ref 150–400)
RBC: 5.06 MIL/uL (ref 4.22–5.81)
RDW: 12.6 % (ref 11.5–15.5)
WBC: 4.9 10*3/uL (ref 4.0–10.5)

## 2016-09-08 LAB — APTT: aPTT: 30 seconds (ref 24–36)

## 2016-09-08 LAB — PROTIME-INR
INR: 1.04
Prothrombin Time: 13.6 seconds (ref 11.4–15.2)

## 2016-09-08 SURGERY — BRONCHOSCOPY, WITH EBUS
Anesthesia: General | Site: Chest

## 2016-09-08 MED ORDER — SUGAMMADEX SODIUM 200 MG/2ML IV SOLN
INTRAVENOUS | Status: DC | PRN
  Administered 2016-09-08: 300 mg via INTRAVENOUS

## 2016-09-08 MED ORDER — PROPOFOL 10 MG/ML IV BOLUS
INTRAVENOUS | Status: DC | PRN
  Administered 2016-09-08: 200 mg via INTRAVENOUS

## 2016-09-08 MED ORDER — PROPOFOL 10 MG/ML IV BOLUS
INTRAVENOUS | Status: AC
  Filled 2016-09-08: qty 20

## 2016-09-08 MED ORDER — OXYCODONE HCL 5 MG PO TABS: 5.0000 mg | ORAL_TABLET | Freq: Once | ORAL | Status: DC | PRN

## 2016-09-08 MED ORDER — FENTANYL CITRATE (PF) 100 MCG/2ML IJ SOLN
INTRAMUSCULAR | Status: AC
  Filled 2016-09-08: qty 4

## 2016-09-08 MED ORDER — MIDAZOLAM HCL 2 MG/2ML IJ SOLN
INTRAMUSCULAR | Status: AC
  Filled 2016-09-08: qty 2

## 2016-09-08 MED ORDER — LACTATED RINGERS IV SOLN
INTRAVENOUS | Status: DC | PRN
  Administered 2016-09-08 (×2): via INTRAVENOUS

## 2016-09-08 MED ORDER — ONDANSETRON HCL 4 MG/2ML IJ SOLN: 4.0000 mg | Freq: Once | INTRAMUSCULAR | Status: DC | PRN

## 2016-09-08 MED ORDER — MIDAZOLAM HCL 5 MG/5ML IJ SOLN
INTRAMUSCULAR | Status: DC | PRN
  Administered 2016-09-08: 2 mg via INTRAVENOUS

## 2016-09-08 MED ORDER — ROCURONIUM BROMIDE 100 MG/10ML IV SOLN
INTRAVENOUS | Status: DC | PRN
  Administered 2016-09-08: 50 mg via INTRAVENOUS
  Administered 2016-09-08: 10 mg via INTRAVENOUS

## 2016-09-08 MED ORDER — PHENYLEPHRINE HCL 10 MG/ML IJ SOLN
INTRAVENOUS | Status: DC | PRN
  Administered 2016-09-08: 20 ug/min via INTRAVENOUS

## 2016-09-08 MED ORDER — LIDOCAINE HCL (CARDIAC) 20 MG/ML IV SOLN
INTRAVENOUS | Status: DC | PRN
  Administered 2016-09-08: 50 mg via INTRAVENOUS

## 2016-09-08 MED ORDER — 0.9 % SODIUM CHLORIDE (POUR BTL) OPTIME
TOPICAL | Status: DC | PRN
  Administered 2016-09-08: 1000 mL

## 2016-09-08 MED ORDER — ARTIFICIAL TEARS OP OINT
TOPICAL_OINTMENT | OPHTHALMIC | Status: DC | PRN
  Administered 2016-09-08: 1 via OPHTHALMIC

## 2016-09-08 MED ORDER — OXYCODONE HCL 5 MG/5ML PO SOLN: 5.0000 mg | Freq: Once | ORAL | Status: DC | PRN

## 2016-09-08 MED ORDER — FENTANYL CITRATE (PF) 100 MCG/2ML IJ SOLN: 25.0000 ug | INTRAMUSCULAR | Status: DC | PRN

## 2016-09-08 MED ORDER — DEXAMETHASONE SODIUM PHOSPHATE 10 MG/ML IJ SOLN
INTRAMUSCULAR | Status: DC | PRN
  Administered 2016-09-08: 10 mg via INTRAVENOUS

## 2016-09-08 MED ORDER — FENTANYL CITRATE (PF) 100 MCG/2ML IJ SOLN
INTRAMUSCULAR | Status: DC | PRN
  Administered 2016-09-08 (×2): 50 ug via INTRAVENOUS
  Administered 2016-09-08: 100 ug via INTRAVENOUS

## 2016-09-08 MED ORDER — ONDANSETRON HCL 4 MG/2ML IJ SOLN
INTRAMUSCULAR | Status: DC | PRN
  Administered 2016-09-08: 4 mg via INTRAVENOUS

## 2016-09-08 SURGICAL SUPPLY — 27 items
BRUSH CYTOL CELLEBRITY 1.5X140 (MISCELLANEOUS) IMPLANT
CANISTER SUCTION 2500CC (MISCELLANEOUS) ×2 IMPLANT
CONT SPEC 4OZ CLIKSEAL STRL BL (MISCELLANEOUS) ×2 IMPLANT
COVER DOME SNAP 22 D (MISCELLANEOUS) ×2 IMPLANT
COVER TABLE BACK 60X90 (DRAPES) ×2 IMPLANT
FORCEPS BIOP RJ4 1.8 (CUTTING FORCEPS) IMPLANT
GAUZE SPONGE 4X4 12PLY STRL (GAUZE/BANDAGES/DRESSINGS) IMPLANT
GLOVE BIO SURGEON STRL SZ7.5 (GLOVE) ×2 IMPLANT
GOWN STRL REUS W/ TWL LRG LVL3 (GOWN DISPOSABLE) ×1 IMPLANT
GOWN STRL REUS W/TWL LRG LVL3 (GOWN DISPOSABLE) ×2
KIT CLEAN ENDO COMPLIANCE (KITS) ×2 IMPLANT
KIT ROOM TURNOVER OR (KITS) ×2 IMPLANT
MARKER SKIN DUAL TIP RULER LAB (MISCELLANEOUS) ×2 IMPLANT
NDL BIOPSY TRANSBRONCH 21G (NEEDLE) IMPLANT
NDL EBUS SONO TIP PENTAX (NEEDLE) IMPLANT
NEEDLE BIOPSY TRANSBRONCH 21G (NEEDLE) IMPLANT
NEEDLE EBUS SONO TIP PENTAX (NEEDLE) ×2 IMPLANT
NEEDLE SONO TIP II EBUS (NEEDLE) IMPLANT
NS IRRIG 1000ML POUR BTL (IV SOLUTION) ×2 IMPLANT
OIL SILICONE PENTAX (PARTS (SERVICE/REPAIRS)) IMPLANT
PAD ARMBOARD 7.5X6 YLW CONV (MISCELLANEOUS) ×4 IMPLANT
SYR 20CC LL (SYRINGE) ×2 IMPLANT
SYR 20ML ECCENTRIC (SYRINGE) ×4 IMPLANT
SYR 5ML LUER SLIP (SYRINGE) ×2 IMPLANT
TOWEL OR 17X24 6PK STRL BLUE (TOWEL DISPOSABLE) ×2 IMPLANT
TRAP SPECIMEN MUCOUS 40CC (MISCELLANEOUS) IMPLANT
TUBE CONNECTING 20X1/4 (TUBING) ×4 IMPLANT

## 2016-09-08 NOTE — Anesthesia Procedure Notes (Signed)
Procedure Name: Intubation Date/Time: 09/08/2016 8:07 AM Performed by: Neldon Newport Pre-anesthesia Checklist: Timeout performed, Patient being monitored, Suction available, Emergency Drugs available and Patient identified Patient Re-evaluated:Patient Re-evaluated prior to inductionOxygen Delivery Method: Circle system utilized Preoxygenation: Pre-oxygenation with 100% oxygen Intubation Type: IV induction Ventilation: Mask ventilation without difficulty Laryngoscope Size: Mac and 3 Grade View: Grade II Tube type: Oral Tube size: 8.5 mm Number of attempts: 1 Placement Confirmation: breath sounds checked- equal and bilateral,  positive ETCO2 and ETT inserted through vocal cords under direct vision Secured at: 22 cm Tube secured with: Tape Dental Injury: Teeth and Oropharynx as per pre-operative assessment

## 2016-09-08 NOTE — H&P (Signed)
Interval H & P  Pt here for EBUS biopsy for eval of mediastinal LNs He has H/O multiple small sung nodules with pretracheal, subcarinal and hilar LNs. See clinic note from 12/127 for more details.  Blood pressure (!) 171/105, pulse 86, temperature 97.7 F (36.5 C), temperature source Oral, resp. rate 20, weight 89.8 kg (198 lb), SpO2 99 %. Awake, alert oriented No distress No focal deficits CVS-RRR RS- Clear Abd- Soft, + BS Ext- No edema  Labs and imaging reviewd  Assessment Multiple pulmonary nodules with medistinal LNs  OK to proceed with EBUS biopsy.  Marshell Garfinkel MD [facility] Pager [phone] If no answer or after 3pm call: [phone] 09/08/2016, 7:44 AM

## 2016-09-08 NOTE — Transfer of Care (Signed)
Immediate Anesthesia Transfer of Care Note  Patient: Bradley [name]  Procedure(s) Performed: Procedure(s): VIDEO BRONCHOSCOPY WITH ENDOBRONCHIAL ULTRASOUND (N/A)  Patient Location: PACU  Anesthesia Type:General  Level of Consciousness: awake, alert  and oriented  Airway & Oxygen Therapy: Patient Spontanous Breathing and Patient connected to face mask oxygen  Post-op Assessment: Report given to RN, Post -op Vital signs reviewed and stable and Patient moving all extremities X 4  Post vital signs: Reviewed and stable  Last Vitals:  Vitals:   09/08/16 0615 09/08/16 0617  BP:  (!) 171/105  Pulse: 86   Resp: 20   Temp: 36.5 C     Last Pain:  Vitals:   09/08/16 0615  TempSrc: Oral         Complications: No apparent anesthesia complications

## 2016-09-08 NOTE — Anesthesia Preprocedure Evaluation (Addendum)
Anesthesia Evaluation  Patient identified by MRN, date of birth, ID band Patient awake    Reviewed: Allergy & Precautions, NPO status , Patient's Chart, lab work & pertinent test results  History of Anesthesia Complications (+) PONV  Airway Mallampati: II  TM Distance: >3 FB Neck ROM: Full    Dental  (+) Teeth Intact, Dental Advisory Given   Pulmonary asthma ,    breath sounds clear to auscultation       Cardiovascular hypertension, Pt. on medications  Rhythm:Regular Rate:Normal     Neuro/Psych  Neuromuscular disease    GI/Hepatic hiatal hernia, GERD  Medicated and Controlled,  Endo/Other    Renal/GU      Musculoskeletal  (+) Arthritis ,   Abdominal   Peds  Hematology   Anesthesia Other Findings   Reproductive/Obstetrics                           Anesthesia Physical Anesthesia Plan  ASA: II  Anesthesia Plan: General   Post-op Pain Management:    Induction: Intravenous  Airway Management Planned: Oral ETT  Additional Equipment:   Intra-op Plan:   Post-operative Plan: Extubation in OR  Informed Consent: I have reviewed the patients History and Physical, chart, labs and discussed the procedure including the risks, benefits and alternatives for the proposed anesthesia with the patient or authorized representative who has indicated his/her understanding and acceptance.   Dental advisory given and Dental Advisory Given  Plan Discussed with: CRNA and Anesthesiologist  Anesthesia Plan Comments:       Anesthesia Quick Evaluation

## 2016-09-08 NOTE — Op Note (Signed)
Video Bronchoscopy with Endobronchial Ultrasound.  Date of Operation: 09/08/16  Pre-op Diagnosis: Pulmonary nodules and mediastinal LAD  Post-op Diagnosis: Same  Surgeon: Marshell Garfinkel  Anesthesia: General endotracheal anesthesia  Operation: Flexible video fiberoptic bronchoscopy with endobronchial ultrasound and biopsies.  Estimated Blood Loss: Minimal  Complications: None apparent  Indications and History: Bradley [name] is a 50 y.o. male with b/l predominately upper lobe nodules on CT chest with mediastinal LAD. Recommendation made to achieve tissue sampling via endobronchial ultrasound guided nodal biopsies. He underwent a transbronchial biopsies under the seventh was nondiagnostic. The risks, benefits, complications, treatment options and expected outcomes were discussed with the patient.  The possibilities of pneumothorax, pneumonia, reaction to medication, pulmonary aspiration, perforation of a viscus, bleeding, failure to diagnose a condition and creating a complication requiring transfusion or operation were discussed with the patient who freely signed the consent.    Description of Procedure: The patient was examined in the preoperative area and history and data from the preprocedure consultation were reviewed. It was deemed appropriate to proceed.  The patient was taken to OR 10, identified as Bradley [name]  and the procedure verified as Flexible Video Fiberoptic Bronchoscopy.  A Time Out was held and the above information confirmed. After being taken to the operating room general anesthesia was initiated and the patient  was orally intubated. The video fiberoptic bronchoscope was introduced via the endotracheal tube and a general inspection was performed normal lab anatomy. The standard scope was then withdrawn and the endobronchial ultrasound was used to identify and characterize the peritracheal, hilar and bronchial lymph nodes. Inspection showed enlargement of station 7 and 10  R,L nodes and 4R. Using real-time ultrasound guidance Wang needle biopsies were take from all the stations and were sent for cytology and tissue culture, AFB, fungus and regular cx. At the end of the procedure a general airway inspection was performed and there was no evidence of active bleeding. The bronchoscope was removed.  The patient tolerated the procedure well. There was no significant blood loss and there were no obvious complications. The patient tolerated the procedure well without apparent complications. There was no significant blood loss. The bronchoscope was withdrawn. Anesthesia was reversed and the patient was taken to the PACU for recovery.   Samples: 1. Wang needle biopsies from 4R node 2. Wang needle biopsies from 7 node 3. Wang needle biopsies from 11L node 4. Wang needle biopsies from 11R node  Plans:  The patient will be discharged from the PACU to home when recovered from anesthesia and after chest x-ray is reviewed. We will review the cytology, pathology and microbiology results with the patient when they become available. Outpatient followup will be with Dr Katheran Johnthomas MD [facility] Pager [phone] If no answer or after 3pm call: [phone] 09/08/2016, 10:34 AM

## 2016-09-08 NOTE — Discharge Instructions (Signed)
Please resume your regular medications Some amount of of minor bleeding and mild fever is normal after this procedure. Please let us know if you have more than a few teaspoons or persistent fevers, cough, sptutum, We will call you with the results.

## 2016-09-08 NOTE — Anesthesia Postprocedure Evaluation (Signed)
Anesthesia Post Note  Patient: Bradley [name]  Procedure(s) Performed: Procedure(s) (LRB): VIDEO BRONCHOSCOPY WITH ENDOBRONCHIAL ULTRASOUND (N/A)  Patient location during evaluation: PACU Anesthesia Type: General Level of consciousness: awake, awake and alert and oriented Pain management: pain level controlled Vital Signs Assessment: post-procedure vital signs reviewed and stable Respiratory status: spontaneous breathing, nonlabored ventilation and respiratory function stable Cardiovascular status: blood pressure returned to baseline Anesthetic complications: no       Last Vitals:  Vitals:   09/08/16 1130 09/08/16 1148  BP:  120/69  Pulse: 87 85  Resp: 18 16  Temp: 36.7 C     Last Pain:  Vitals:   09/08/16 1148  TempSrc:   PainSc: 0-No pain                 Adalyne Lovick COKER

## 2016-09-09 ENCOUNTER — Encounter (HOSPITAL_COMMUNITY): Payer: Self-pay | Admitting: Pulmonary Disease

## 2016-09-09 LAB — ACID FAST SMEAR (AFB): ACID FAST SMEAR - AFSCU2: NEGATIVE

## 2016-09-09 LAB — ACID FAST SMEAR (AFB, MYCOBACTERIA)

## 2016-09-10 ENCOUNTER — Telehealth: Payer: Self-pay | Admitting: Pulmonary Disease

## 2016-09-10 MED ORDER — MUPIROCIN CALCIUM 2 % EX CREA: 1.0000 "application " | TOPICAL_CREAM | Freq: Three times a day (TID) | CUTANEOUS | 0 refills | Status: DC

## 2016-09-10 NOTE — Telephone Encounter (Signed)
Rx has been sent in per PM. Nothing further was needed.

## 2016-09-10 NOTE — Telephone Encounter (Signed)
I called and spoke with the patient. This is probably not related to the bronch as we did not go in through the nostril. He has some minor non productive cough.  Please call in Mupirocin nasal cream. Apply to affected area 3 times daily for 10 days. Is symptoms do not improve then he will need to be seen at primary care or with [place].  Deke Tilghman

## 2016-09-10 NOTE — Telephone Encounter (Signed)
Spoke with pt who states he had a bronch on Monday, pt thinks he has developed a staph infection on his left nostril. Pt states he has white and yellow blisters on his nostril. Pt feels that he feels that he had a fever (pt did not check his temp) Pt is requesting recommendations.  PM please advise. Thanks.

## 2016-09-13 LAB — AEROBIC/ANAEROBIC CULTURE (SURGICAL/DEEP WOUND)

## 2016-09-13 LAB — AEROBIC/ANAEROBIC CULTURE W GRAM STAIN (SURGICAL/DEEP WOUND): Culture: NO GROWTH

## 2016-09-30 ENCOUNTER — Telehealth: Payer: Self-pay | Admitting: Pulmonary Disease

## 2016-09-30 DIAGNOSIS — D869 Sarcoidosis, unspecified: Secondary | ICD-10-CM

## 2016-09-30 NOTE — Telephone Encounter (Signed)
I called and spoke with Mr. Bretl about his bronch results. The cytology from lymph nodes shows granulomas consistent with the diagnosis of sarcoidosis. There is no evidence of malignancy. Culture results are negative to date. He still has chronic cough likely from post nasal drip and mild DOE. Since his symptoms are not excessive I have advised weight and watch approach without starting active immunosuppression for sarcoidosis.   I will follow-up with him in 6 months with a repeat CT scan without contrast and clinic visit after scan for further evaluation.

## 2016-10-01 NOTE — Telephone Encounter (Signed)
Order placed for CT in 6 months with visit Will sign off

## 2016-10-07 LAB — FUNGUS CULTURE RESULT

## 2016-10-07 LAB — FUNGUS CULTURE WITH STAIN

## 2016-10-07 LAB — FUNGAL ORGANISM REFLEX

## 2016-10-22 LAB — ACID FAST CULTURE WITH REFLEXED SENSITIVITIES (MYCOBACTERIA): Acid Fast Culture: NEGATIVE

## 2016-11-05 ENCOUNTER — Other Ambulatory Visit: Payer: Self-pay | Admitting: Family Medicine

## 2016-11-06 NOTE — Telephone Encounter (Signed)
Called and left a voicemail message asking for a return phone call. He needs a B12 level drawn. I don't see where one was drawn at all last year

## 2016-11-07 ENCOUNTER — Other Ambulatory Visit (INDEPENDENT_AMBULATORY_CARE_PROVIDER_SITE_OTHER): Payer: 59

## 2016-11-07 ENCOUNTER — Other Ambulatory Visit: Payer: Self-pay

## 2016-11-07 DIAGNOSIS — E538 Deficiency of other specified B group vitamins: Secondary | ICD-10-CM

## 2016-11-07 LAB — VITAMIN B12: VITAMIN B 12: 354 pg/mL (ref 200–1100)

## 2016-11-07 NOTE — Telephone Encounter (Signed)
Patient has a lab appointment scheduled for today at 3.30pm for his Vitamin B12.

## 2016-11-07 NOTE — Telephone Encounter (Signed)
Spoke to patient verbalized understanding that  Dr Yong Channel needs him to come and have his B12 levels drawn before refilling his Vit B12 injection.

## 2016-11-24 ENCOUNTER — Other Ambulatory Visit: Payer: Self-pay | Admitting: Family Medicine

## 2016-12-01 ENCOUNTER — Ambulatory Visit: Payer: 59 | Admitting: Family Medicine

## 2016-12-04 ENCOUNTER — Ambulatory Visit (INDEPENDENT_AMBULATORY_CARE_PROVIDER_SITE_OTHER): Payer: 59 | Admitting: Family Medicine

## 2016-12-04 ENCOUNTER — Encounter: Payer: Self-pay | Admitting: Family Medicine

## 2016-12-04 VITALS — BP 142/100 | HR 82 | Temp 98.1°F | Ht 71.0 in | Wt 195.2 lb

## 2016-12-04 DIAGNOSIS — M79652 Pain in left thigh: Secondary | ICD-10-CM | POA: Diagnosis not present

## 2016-12-04 MED ORDER — PREDNISONE 20 MG PO TABS: ORAL_TABLET | ORAL | 0 refills | Status: DC

## 2016-12-04 NOTE — Progress Notes (Signed)
Subjective:  Bradley [name] is a 51 y.o. year old very pleasant male patient who presents for/with See problem oriented charting ROS-   No fecal or urinary incontinence. No saddle anesthesia. No leg weakness. Some groin pain at times.   Past Medical History-  Patient Active Problem List   Diagnosis Date Noted  . Insomnia 05/10/2015    Priority: Medium  . Essential hypertension 05/10/2015    Priority: Medium  . Hyperlipidemia 05/12/2007    Priority: Medium  . GERD 05/12/2007    Priority: Medium  . Gallstones 05/10/2015    Priority: Low  . History of colonic polyps 10/03/2010    Priority: Low  . B12 deficiency 09/05/2009    Priority: Low  . IBS 08/31/2009    Priority: Low  . ALLERGIC RHINITIS, SEASONAL 07/24/2009    Priority: Low  . External hemorrhoids 06/07/2008    Priority: Low  . Pulmonary nodules 06/03/2016  . SBO (small bowel obstruction) 04/11/2016  . Hypokalemia 04/11/2016  . Total bilirubin, elevated 04/11/2016    Medications- reviewed and updated Current Outpatient Prescriptions  Medication Sig Dispense Refill  . atorvastatin (LIPITOR) 10 MG tablet TAKE 1 TABLET (10 MG TOTAL) BY MOUTH DAILY. 90 tablet 3  . azelastine (ASTELIN) 0.1 % nasal spray Place 2 sprays into both nostrils 2 (two) times daily. Use in each nostril as directed (Patient taking differently: Place 2 sprays into both nostrils 2 (two) times daily as needed for allergies ([facility]). Use in each nostril as directed) 30 mL 3  . clonazePAM (KLONOPIN) 0.5 MG tablet TAKE 1 TABLET AT BEDTIME AS NEEDED FOR SLEEP 30 tablet 5  . cyanocobalamin (,VITAMIN B-12,) 1000 MCG/ML injection Inject 1,000 mcg into the skin every 30 (thirty) days.    . cyanocobalamin (,VITAMIN B-12,) 1000 MCG/ML injection INJECT 1 ML EVERY MONTH 1 mL 8  . DEXILANT 30 MG capsule TAKE 1 CAPSULE (30 MG TOTAL) BY MOUTH DAILY. 90 capsule 3  . fluticasone (FLONASE) 50 MCG/ACT nasal spray Place 2 sprays into both nostrils daily. (Patient  taking differently: Place 2 sprays into both nostrils daily as needed for allergies ([facility]). ) 16 g 3  . mupirocin cream (BACTROBAN) 2 % Apply 1 application topically 3 (three) times daily. For 10 days 30 g 0  . PAZEO 0.7 % SOLN Place 1 drop into both eyes daily as needed ([facility]). Use as needed    . predniSONE (DELTASONE) 20 MG tablet Take 2 pills for 3 days, 1 pill for 4 days 10 tablet 0   No current facility-administered medications for this visit.     Objective: BP (!) 142/100 (BP Location: Left Arm, Patient Position: Sitting, Cuff Size: Large)   Pulse 82   Temp 98.1 F (36.7 C) (Oral)   Ht 5\' 11"  (1.803 m)   Wt 195 lb 3.2 oz (88.5 kg)   SpO2 96%   BMI 27.22 kg/m  Gen: NAD, appears slightly uncomfortable CV: normal rate Lungs: nonlabored Ext: no edema Skin: warm, dry  Back - Normal skin, Spine with normal alignment and no deformity.  No tenderness to vertebral process palpation.  Paraspinous muscles are tender but without spasm.   Range of motion is full at neck and lumbar sacral regions. Negative Straight leg raise. With IR of hip mild pain in groin and with flexion.  Neuro- no saddle anesthesia, 5/5 strength lower extremities, 2+ reflexes  Assessment/Plan:  Left thigh pain S: at his physical he had some burning in  left upper thigh but then resolved within a few weeks.   Then 2 weeks ago restarted and more intense than last time. If he stands up after sitting for a while he feels like a pop or a roll in the left thigh. Also continues to have the burning. Sometimes down to but not past the knee. No weakness in the leg. Does have some paresthesias but that is chronic from a ruptured disc around L4. Does feel like his back pain has been worse during this time frame as well. Has been about 6-7/10.  A/P: suspect this is related to issues with L4 disc. Has had good response in past with neurosurgery on prednisone- he agrees to trial 7 day course and if not improved  to follow up with Dr. Vertell Limber of neurosurgery  PRN follow up but may follow with neurosurgery as well  BP noted elevated. Likely up with pain. At home has been 130s /70s before this pain increased  Meds ordered this encounter  Medications  . predniSONE (DELTASONE) 20 MG tablet    Sig: Take 2 pills for 3 days, 1 pill for 4 days    Dispense:  10 tablet    Refill:  0    Return precautions advised.  Garret Reddish, MD

## 2016-12-04 NOTE — Patient Instructions (Signed)
Suspect this could be coming from the back  Lets trial 7 days of prednisone  If no better would call Dr. Vertell Limber for follow up. If weakness developed or worsening pain or incontinence would need to be seen immediately

## 2016-12-04 NOTE — Progress Notes (Signed)
Pre visit review using our clinic review tool, if applicable. No additional management support is needed unless otherwise documented below in the visit note. 

## 2016-12-10 ENCOUNTER — Telehealth: Payer: Self-pay | Admitting: Family Medicine

## 2016-12-10 NOTE — Telephone Encounter (Signed)
Patient states that he has taken the prednisone. He states that his lower back and thigh is still hurting. He says his shin hurts also which he says lets him know that it is muscular. He denies using heat or ice. He is using an ace bandage that he says help. He is also taking Aleve that he says helps a little.

## 2016-12-10 NOTE — Telephone Encounter (Signed)
Given lack of response he can return to Dr. Vertell Limber of neurosurgery. IF he would prefer sports medicine as first step- can refer to Dr. Paulla Fore

## 2016-12-10 NOTE — Telephone Encounter (Signed)
Patient wishes to return to Dr. Vertell Limber. I advised him to let us know if he needed further assistance

## 2016-12-10 NOTE — Telephone Encounter (Signed)
Pt was seen on 12-04-16 and was given prednisone . Pt has ?about prednisone

## 2017-03-05 ENCOUNTER — Other Ambulatory Visit: Payer: Self-pay | Admitting: Family Medicine

## 2017-03-05 NOTE — Telephone Encounter (Signed)
Lets have him in for appointment.

## 2017-04-01 ENCOUNTER — Ambulatory Visit (HOSPITAL_COMMUNITY)
Admission: RE | Admit: 2017-04-01 | Discharge: 2017-04-01 | Disposition: A | Discharge status: Home / Self Care | Payer: 59 | Source: Ambulatory Visit | Attending: Pulmonary Disease | Admitting: Pulmonary Disease

## 2017-04-01 DIAGNOSIS — K8020 Calculus of gallbladder without cholecystitis without obstruction: Secondary | ICD-10-CM | POA: Insufficient documentation

## 2017-04-01 DIAGNOSIS — R918 Other nonspecific abnormal finding of lung field: Secondary | ICD-10-CM | POA: Diagnosis not present

## 2017-04-01 DIAGNOSIS — I700 Atherosclerosis of aorta: Secondary | ICD-10-CM | POA: Diagnosis not present

## 2017-04-01 DIAGNOSIS — I2510 Atherosclerotic heart disease of native coronary artery without angina pectoris: Secondary | ICD-10-CM | POA: Diagnosis not present

## 2017-04-01 DIAGNOSIS — D1803 Hemangioma of intra-abdominal structures: Secondary | ICD-10-CM | POA: Diagnosis not present

## 2017-04-01 DIAGNOSIS — D869 Sarcoidosis, unspecified: Secondary | ICD-10-CM | POA: Diagnosis present

## 2017-04-08 ENCOUNTER — Telehealth: Payer: Self-pay | Admitting: Pulmonary Disease

## 2017-04-08 NOTE — Telephone Encounter (Signed)
atc pt X2, line rang to fast busy signal.  Wcb.  

## 2017-04-09 NOTE — Telephone Encounter (Signed)
Attempted to call the pt but the phone call will not go through due to our phone system

## 2017-04-10 NOTE — Telephone Encounter (Signed)
Called patient. He stated that PM had called him on his home number and left a message for him to call back. Pt stated he would like PM to call him back on his mobile number [phone].  Will route to PM.

## 2017-04-11 NOTE — Telephone Encounter (Signed)
I called and updated him on the results of the CT scan. Please make sure he has 6 month follow up with me in clinic.   Marshell Garfinkel MD [facility] Pager [phone] 04/11/2017, 8:52 AM

## 2017-04-13 NOTE — Telephone Encounter (Signed)
Recall put in for 6 mo ROV. Pt aware. Nothing further needed.

## 2017-06-01 ENCOUNTER — Other Ambulatory Visit: Payer: Self-pay | Admitting: Family Medicine

## 2017-06-02 NOTE — Telephone Encounter (Signed)
Yes thanks, 2 refills

## 2017-06-11 ENCOUNTER — Other Ambulatory Visit: Payer: Self-pay | Admitting: Family Medicine

## 2017-08-19 ENCOUNTER — Encounter: Payer: Self-pay | Admitting: Family Medicine

## 2017-08-19 ENCOUNTER — Ambulatory Visit (INDEPENDENT_AMBULATORY_CARE_PROVIDER_SITE_OTHER): Payer: 59 | Admitting: Family Medicine

## 2017-08-19 VITALS — BP 138/92 | HR 87 | Temp 97.6°F | Ht 71.0 in | Wt 195.2 lb

## 2017-08-19 DIAGNOSIS — Z114 Encounter for screening for human immunodeficiency virus [HIV]: Secondary | ICD-10-CM | POA: Diagnosis not present

## 2017-08-19 DIAGNOSIS — G4700 Insomnia, unspecified: Secondary | ICD-10-CM

## 2017-08-19 DIAGNOSIS — K219 Gastro-esophageal reflux disease without esophagitis: Secondary | ICD-10-CM

## 2017-08-19 DIAGNOSIS — E785 Hyperlipidemia, unspecified: Secondary | ICD-10-CM

## 2017-08-19 DIAGNOSIS — R918 Other nonspecific abnormal finding of lung field: Secondary | ICD-10-CM

## 2017-08-19 DIAGNOSIS — Z125 Encounter for screening for malignant neoplasm of prostate: Secondary | ICD-10-CM | POA: Diagnosis not present

## 2017-08-19 DIAGNOSIS — I10 Essential (primary) hypertension: Secondary | ICD-10-CM | POA: Diagnosis not present

## 2017-08-19 DIAGNOSIS — Z23 Encounter for immunization: Secondary | ICD-10-CM

## 2017-08-19 DIAGNOSIS — Z0000 Encounter for general adult medical examination without abnormal findings: Secondary | ICD-10-CM | POA: Diagnosis not present

## 2017-08-19 DIAGNOSIS — J301 Allergic rhinitis due to pollen: Secondary | ICD-10-CM

## 2017-08-19 LAB — COMPREHENSIVE METABOLIC PANEL
ALT: 84 U/L — AB (ref 0–53)
AST: 33 U/L (ref 0–37)
Albumin: 4.7 g/dL (ref 3.5–5.2)
Alkaline Phosphatase: 111 U/L (ref 39–117)
BILIRUBIN TOTAL: 0.8 mg/dL (ref 0.2–1.2)
BUN: 21 mg/dL (ref 6–23)
CHLORIDE: 102 meq/L (ref 96–112)
CO2: 30 meq/L (ref 19–32)
Calcium: 9.9 mg/dL (ref 8.4–10.5)
Creatinine, Ser: 1.06 mg/dL (ref 0.40–1.50)
GFR: 77.67 mL/min (ref >60.00)
GLUCOSE: 107 mg/dL — AB (ref 70–99)
POTASSIUM: 4.5 meq/L (ref 3.5–5.1)
Sodium: 140 mEq/L (ref 135–145)
Total Protein: 7 g/dL (ref 6.0–8.3)

## 2017-08-19 LAB — CBC
HCT: 48 % (ref 39.0–52.0)
Hemoglobin: 16.2 g/dL (ref 13.0–17.0)
MCHC: 33.7 g/dL (ref 30.0–36.0)
MCV: 92.6 fl (ref 78.0–100.0)
Platelets: 217 10*3/uL (ref 150.0–400.0)
RBC: 5.19 Mil/uL (ref 4.22–5.81)
RDW: 13 % (ref 11.5–15.5)
WBC: 6.4 10*3/uL (ref 4.0–10.5)

## 2017-08-19 LAB — LIPID PANEL
CHOL/HDL RATIO: 4
Cholesterol: 214 mg/dL — ABNORMAL HIGH (ref 0–200)
HDL: 56.6 mg/dL (ref >39.00)
LDL CALC: 127 mg/dL — AB (ref 0–99)
NONHDL: 156.99
Triglycerides: 151 mg/dL — ABNORMAL HIGH (ref 0.0–149.0)
VLDL: 30.2 mg/dL (ref 0.0–40.0)

## 2017-08-19 LAB — PSA: PSA: 1.23 ng/mL (ref 0.10–4.00)

## 2017-08-19 MED ORDER — OMEPRAZOLE 40 MG PO CPDR: 40.0000 mg | DELAYED_RELEASE_CAPSULE | Freq: Every day | ORAL | 3 refills | Status: DC

## 2017-08-19 NOTE — Assessment & Plan Note (Signed)
HLD- poor control on atorvastatin 10mg  twice a week last year and advised go up to 4x a week with LDL at 145 at that time- he had gone up to daily.  update lipids.

## 2017-08-19 NOTE — Progress Notes (Addendum)
Phone: [phone]  Subjective:  Patient presents today for their annual physical. Chief complaint-noted.   See problem oriented charting- ROS- full  review of systems was completed and negative except for: continued issues with back pain - sees Dr. Vertell Limber. Numbness in leg related to his back  The following were reviewed and entered/updated in epic: Past Medical History:  Diagnosis Date  . Allergy   . Blood transfusion without reported diagnosis   . Cough variant asthma 10/12/2007   no per pt 06-19-16  . Elevated LFTs   . Esophageal reflux   . GERD (gastroesophageal reflux disease)   . Hiatal hernia   . Hyperlipidemia   . Irritable bowel syndrome   . LATERAL EPICONDYLITIS 03/07/2010   Qualifier: Diagnosis of  By: Elease Hashimoto MD, Bruce  left shoulder arthrits per pt 06-19-16  . Liver lesion   . Lung mass   . Personal history of colonic polyps 09/28/2009    ADENOMATOUS POLYP  . PONV (postoperative nausea and vomiting)   . Schatzki's ring   . Vitamin B 12 deficiency    Patient Active Problem List   Diagnosis Date Noted  . Insomnia 05/10/2015    Priority: Medium  . Essential hypertension 05/10/2015    Priority: Medium  . Hyperlipidemia 05/12/2007    Priority: Medium  . GERD 05/12/2007    Priority: Medium  . Gallstones 05/10/2015    Priority: Low  . History of colonic polyps 10/03/2010    Priority: Low  . B12 deficiency 09/05/2009    Priority: Low  . IBS 08/31/2009    Priority: Low  . ALLERGIC RHINITIS, SEASONAL 07/24/2009    Priority: Low  . External hemorrhoids 06/07/2008    Priority: Low  . Pulmonary nodules 06/03/2016  . SBO (small bowel obstruction) ([place]) 04/11/2016  . Hypokalemia 04/11/2016  . Total bilirubin, elevated 04/11/2016   Past Surgical History:  Procedure Laterality Date  . COLONOSCOPY    . Exploration Laparotomy and Small Bowel resection    . LEG SURGERY     left  . LUMBAR LAMINECTOMY     x3 last one June 2017  . UPPER GASTROINTESTINAL ENDOSCOPY     . VENTRAL HERNIA REPAIR    . VIDEO BRONCHOSCOPY Bilateral 06/10/2016   Procedure: VIDEO BRONCHOSCOPY WITH FLUORO;  Surgeon: Marshell Garfinkel, MD;  Location: [facility];  Service: Cardiopulmonary;  Laterality: Bilateral;  . VIDEO BRONCHOSCOPY WITH ENDOBRONCHIAL ULTRASOUND N/A 09/08/2016   Procedure: VIDEO BRONCHOSCOPY WITH ENDOBRONCHIAL ULTRASOUND;  Surgeon: Marshell Garfinkel, MD;  Location: [facility];  Service: Pulmonary;  Laterality: N/A;    Family History  Problem Relation Age of Onset  . Breast cancer Mother   . Thyroid cancer Mother   . Colon cancer Neg Hx   . Esophageal cancer Neg Hx   . Rectal cancer Neg Hx   . Stomach cancer Neg Hx     Medications- reviewed and updated Current Outpatient Medications  Medication Sig Dispense Refill  . atorvastatin (LIPITOR) 10 MG tablet TAKE 1 TABLET (10 MG TOTAL) BY MOUTH DAILY. 90 tablet 3  . azelastine (ASTELIN) 0.1 % nasal spray Place 2 sprays into both nostrils 2 (two) times daily. Use in each nostril as directed (Patient taking differently: Place 2 sprays into both nostrils 2 (two) times daily as needed for allergies ([facility]). Use in each nostril as directed) 30 mL 3  . clonazePAM (KLONOPIN) 0.5 MG tablet TAKE 1 TABLET AT BEDTIME AS NEEDED FOR SLEEP 30 tablet 2  . cyanocobalamin (,VITAMIN B-12,) 1000  MCG/ML injection Inject 1,000 mcg into the skin every 30 (thirty) days.    Marland Kitchen PAZEO 0.7 % SOLN Place 1 drop into both eyes daily as needed ([facility]). Use as needed    . omeprazole (PRILOSEC) 40 MG capsule Take 1 capsule (40 mg total) by mouth daily. 90 capsule 3   No current facility-administered medications for this visit.     Allergies-reviewed and updated Allergies  Allergen Reactions  . No Known Allergies     Social History   Socioeconomic History  . Marital status: Married    Spouse name: None  . Number of children: 2  . Years of education: None  . Highest education level: None  Social Needs  . Financial resource  strain: None  . Food insecurity - worry: None  . Food insecurity - inability: None  . Transportation needs - medical: None  . Transportation needs - non-medical: None  Occupational History  . Occupation: ELECTRICIAN    Employer: [employer]  Tobacco Use  . Smoking status: Never Smoker  . Smokeless tobacco: Never Used  Substance and Sexual Activity  . Alcohol use: Yes    Comment: occ.  . Drug use: No  . Sexual activity: None  Other Topics Concern  . None  Social History Narrative   Married, lives with wife and son   OCCUPATION: Physiological scientist     Objective: BP (!) 138/92   Pulse Bradley   Temp 97.6 F (36.4 C) (Oral)   Ht 5\' 11"  (1.803 m)   Wt 195 lb 3.2 oz (88.5 kg)   SpO2 98%   BMI 27.22 kg/m  Gen: NAD, resting comfortably HEENT: Mucous membranes are moist. Oropharynx normal Neck: no thyromegaly CV: RRR no murmurs rubs or gallops Lungs: CTAB no crackles, wheeze, rhonchi Abdomen: soft/nontender/nondistended/normal bowel sounds. No rebound or guarding. overweight Ext: no edema Skin: warm, dry Neuro: grossly normal, moves all extremities, PERRLA Rectal: normal tone, normal sized prostate, no masses or tenderness  Assessment/Plan:  51 y.o. Bradley [name] presenting for annual physical.  Health Maintenance counseling: 1. Anticipatory guidance: Patient counseled regarding regular dental exams q6 months, eye exams -yearly, wearing seatbelts.  2. Risk factor reduction:  Advised patient of need for regular exercise and diet rich and fruits and vegetables to reduce risk of heart attack and stroke. Exercise- walking a lot at work- advise 150 minutes exercise per week outside of work- does walk 2 miles on weekend days. Diet-. Goal was 10-15 lbs off from last CPE but has at least lost 3. Set goal of anotehr 5-10 lbs in next year- knows he needs to cut down on junk foods Wt Readings from Last 3 Encounters:  08/19/17 195 lb 3.2 oz (88.5 kg)  12/04/16 195 lb 3.2 oz (88.5 kg)  09/08/16 198 lb  (89.8 kg)  3. Immunizations/screenings/ancillary studies- discussed shingrix availability issues. Flu shot today. HIV screen - due today.  Immunization History  Administered Date(s) Administered  . Influenza Split 07/14/2011  . Influenza,inj,Quad PF,6+ Mos 06/27/2013, 07/30/2015, 06/17/2016  . Td 06/23/1999, 08/25/2008  . Tdap 06/21/2016  4. Prostate cancer screening- friend with prostate cancer and requests screening. Low risk rectal exam. Start with PSAs today  5. Colon cancer screening - 07/03/16 with 5 year repeat with several adenomas 6. Skin cancer screening- advised regular sunscreen use. Denies worrisome, changing, or new skin lesions.   Status of chronic or acute concerns   GERD GERD- rare breakthrough of symptoms on dexilant 30mg . He was having nausea for 30  minutes each morning after taking- switched to 2 of the prilosec 20mg  OTC and working similarly without the nausea. Breakthrough on 20mg . Will fill prilosec 40mg  today. Failed zantac and pepcid trial  Hyperlipidemia HLD- poor control on atorvastatin 10mg  twice a week last year and advised go up to 4x a week with LDL at 145 at that time- he had gone up to daily.  update lipids.   ALLERGIC RHINITIS, SEASONAL Allergies- on flonase in past- now off. astelin reports bad taste in mouth- uses sparingly. Chlorpheniramine helped in past- not using now  Insomnia Insomnia doing well on prn klonopin. Refilled in September- he will let us know if needs refill- does not use everynight- usually 2x a week or 3x  Essential hypertension HTN- controlled last year on hctz 25mg . At home about 130/80 without medicine- states was only rarely taking the hctz anyway and he has stopped.  BP Readings from Last 3 Encounters:  08/19/17 (!) 146/98. States anxious about needles with flu shot and need for bloodwork today--> 138/92  12/04/16 (!) 142/100  09/08/16 120/69  Patient concerned about whitecoat element - we will have him bring home cuff next  visit to make sure accurate- only slightly high on repeat on diastolic today  Pulmonary nodules Sees pulmonary for possible sarcoid. Follow up scan actually improved. Will follow up in 6 months  6 months and bring in your home blood pressure cuff  Orders Placed This Encounter  Procedures  . CBC    [place]  . Comprehensive metabolic panel    [place]    Order Specific Question:   Has the patient fasted?    Answer:   No  . Lipid panel    [place]    Order Specific Question:   Has the patient fasted?    Answer:   No  . PSA  . HIV antibody   Meds ordered this encounter  Medications  . omeprazole (PRILOSEC) 40 MG capsule    Sig: Take 1 capsule (40 mg total) by mouth daily.    Dispense:  90 capsule    Refill:  3   Return precautions advised.  Garret Reddish, MD

## 2017-08-19 NOTE — Assessment & Plan Note (Signed)
Sees pulmonary for possible sarcoid. Follow up scan actually improved. Will follow up in 6 months

## 2017-08-19 NOTE — Addendum Note (Signed)
Addended by: Lyndle Herrlich on: 08/19/2017 09:12 AM   Modules accepted: Orders

## 2017-08-19 NOTE — Assessment & Plan Note (Signed)
HTN- controlled last year on hctz 25mg . At home about 130/80 without medicine- states was only rarely taking the hctz anyway and he has stopped.  BP Readings from Last 3 Encounters:  08/19/17 (!) 146/98. States anxious about needles with flu shot and need for bloodwork today--> 138/92  12/04/16 (!) 142/100  09/08/16 120/69  Patient concerned about whitecoat element - we will have him bring home cuff next visit to make sure accurate- only slightly high on repeat on diastolic today

## 2017-08-19 NOTE — Patient Instructions (Addendum)
Please stop by lab before you go  Bring home cuff to next visit in 6 months. Focus on 5-10 lb weight loss.   Blood pressure slightly high in office but has looked good at home- definitely want <140/90 but under 130/80 even better

## 2017-08-19 NOTE — Assessment & Plan Note (Signed)
Allergies- on flonase in past- now off. astelin reports bad taste in mouth- uses sparingly. Chlorpheniramine helped in past- not using now

## 2017-08-19 NOTE — Assessment & Plan Note (Signed)
Insomnia doing well on prn klonopin. Refilled in September- he will let us know if needs refill- does not use everynight- usually 2x a week or 3x

## 2017-08-19 NOTE — Assessment & Plan Note (Signed)
GERD- rare breakthrough of symptoms on dexilant 30mg . He was having nausea for 30 minutes each morning after taking- switched to 2 of the prilosec 20mg  OTC and working similarly without the nausea. Breakthrough on 20mg . Will fill prilosec 40mg  today. Failed zantac and pepcid trial

## 2017-08-20 LAB — HIV ANTIBODY (ROUTINE TESTING W REFLEX): HIV: NONREACTIVE

## 2017-09-03 ENCOUNTER — Other Ambulatory Visit: Payer: Self-pay

## 2017-09-03 MED ORDER — ATORVASTATIN CALCIUM 10 MG PO TABS: 10.0000 mg | ORAL_TABLET | Freq: Every day | ORAL | 3 refills | Status: DC

## 2017-11-18 ENCOUNTER — Inpatient Hospital Stay (HOSPITAL_COMMUNITY)
Admission: EM | Admit: 2017-11-18 | Discharge: 2017-11-20 | DRG: 390 | Disposition: A | Discharge status: Home / Self Care | Payer: 59 | Attending: Internal Medicine | Admitting: Internal Medicine

## 2017-11-18 ENCOUNTER — Other Ambulatory Visit: Payer: Self-pay

## 2017-11-18 ENCOUNTER — Emergency Department (HOSPITAL_COMMUNITY): Payer: 59

## 2017-11-18 ENCOUNTER — Inpatient Hospital Stay (HOSPITAL_COMMUNITY): Payer: 59

## 2017-11-18 ENCOUNTER — Encounter (HOSPITAL_COMMUNITY): Payer: Self-pay

## 2017-11-18 ENCOUNTER — Telehealth: Payer: Self-pay

## 2017-11-18 DIAGNOSIS — I10 Essential (primary) hypertension: Secondary | ICD-10-CM | POA: Diagnosis present

## 2017-11-18 DIAGNOSIS — E785 Hyperlipidemia, unspecified: Secondary | ICD-10-CM | POA: Diagnosis present

## 2017-11-18 DIAGNOSIS — D1803 Hemangioma of intra-abdominal structures: Secondary | ICD-10-CM | POA: Diagnosis present

## 2017-11-18 DIAGNOSIS — K219 Gastro-esophageal reflux disease without esophagitis: Secondary | ICD-10-CM

## 2017-11-18 DIAGNOSIS — K5651 Intestinal adhesions [bands], with partial obstruction: Secondary | ICD-10-CM | POA: Diagnosis not present

## 2017-11-18 DIAGNOSIS — R112 Nausea with vomiting, unspecified: Secondary | ICD-10-CM

## 2017-11-18 DIAGNOSIS — Z79899 Other long term (current) drug therapy: Secondary | ICD-10-CM

## 2017-11-18 DIAGNOSIS — Z0189 Encounter for other specified special examinations: Secondary | ICD-10-CM

## 2017-11-18 DIAGNOSIS — Z9049 Acquired absence of other specified parts of digestive tract: Secondary | ICD-10-CM

## 2017-11-18 DIAGNOSIS — G8929 Other chronic pain: Secondary | ICD-10-CM | POA: Diagnosis present

## 2017-11-18 DIAGNOSIS — K56609 Unspecified intestinal obstruction, unspecified as to partial versus complete obstruction: Secondary | ICD-10-CM | POA: Diagnosis present

## 2017-11-18 DIAGNOSIS — M545 Low back pain: Secondary | ICD-10-CM | POA: Diagnosis present

## 2017-11-18 LAB — URINALYSIS, ROUTINE W REFLEX MICROSCOPIC
BILIRUBIN URINE: NEGATIVE
GLUCOSE, UA: NEGATIVE mg/dL
HGB URINE DIPSTICK: NEGATIVE
Ketones, ur: NEGATIVE mg/dL
Leukocytes, UA: NEGATIVE
Nitrite: NEGATIVE
PROTEIN: NEGATIVE mg/dL
Specific Gravity, Urine: 1.034 — ABNORMAL HIGH (ref 1.005–1.030)
pH: 7 (ref 5.0–8.0)

## 2017-11-18 LAB — COMPREHENSIVE METABOLIC PANEL
ALK PHOS: 83 U/L (ref 38–126)
ALT: 42 U/L (ref 17–63)
AST: 29 U/L (ref 15–41)
Albumin: 4 g/dL (ref 3.5–5.0)
Anion gap: 8 (ref 5–15)
BUN: 23 mg/dL — AB (ref 6–20)
CALCIUM: 8.5 mg/dL — AB (ref 8.9–10.3)
CO2: 27 mmol/L (ref 22–32)
Chloride: 105 mmol/L (ref 101–111)
Creatinine, Ser: 1.11 mg/dL (ref 0.61–1.24)
GFR calc non Af Amer: >60 mL/min (ref >60)
Glucose, Bld: 127 mg/dL — ABNORMAL HIGH (ref 65–99)
Potassium: 3.9 mmol/L (ref 3.5–5.1)
SODIUM: 140 mmol/L (ref 135–145)
Total Bilirubin: 1 mg/dL (ref 0.3–1.2)
Total Protein: 6.4 g/dL — ABNORMAL LOW (ref 6.5–8.1)

## 2017-11-18 LAB — LIPASE, BLOOD: Lipase: 21 U/L (ref 11–51)

## 2017-11-18 LAB — CBC
HCT: 42.2 % (ref 39.0–52.0)
Hemoglobin: 14.7 g/dL (ref 13.0–17.0)
MCH: 31.3 pg (ref 26.0–34.0)
MCHC: 34.8 g/dL (ref 30.0–36.0)
MCV: 89.8 fL (ref 78.0–100.0)
Platelets: 169 10*3/uL (ref 150–400)
RBC: 4.7 MIL/uL (ref 4.22–5.81)
RDW: 12.2 % (ref 11.5–15.5)
WBC: 9 10*3/uL (ref 4.0–10.5)

## 2017-11-18 LAB — GLUCOSE, CAPILLARY: GLUCOSE-CAPILLARY: 117 mg/dL — AB (ref 65–99)

## 2017-11-18 MED ORDER — SODIUM CHLORIDE 0.9 % IJ SOLN
INTRAMUSCULAR | Status: AC
  Filled 2017-11-18: qty 50

## 2017-11-18 MED ORDER — METOCLOPRAMIDE HCL 5 MG/ML IJ SOLN
10.0000 mg | Freq: Once | INTRAMUSCULAR | Status: AC
  Administered 2017-11-18: 10 mg via INTRAVENOUS
  Filled 2017-11-18: qty 2

## 2017-11-18 MED ORDER — MORPHINE SULFATE (PF) 4 MG/ML IV SOLN
4.0000 mg | Freq: Once | INTRAVENOUS | Status: AC
  Administered 2017-11-18: 4 mg via INTRAVENOUS
  Filled 2017-11-18: qty 1

## 2017-11-18 MED ORDER — SODIUM CHLORIDE 0.9 % IV BOLUS (SEPSIS)
1000.0000 mL | Freq: Once | INTRAVENOUS | Status: AC
  Administered 2017-11-18: 1000 mL via INTRAVENOUS

## 2017-11-18 MED ORDER — DIATRIZOATE MEGLUMINE & SODIUM 66-10 % PO SOLN
90.0000 mL | Freq: Once | ORAL | Status: AC
  Administered 2017-11-18: 90 mL via NASOGASTRIC
  Filled 2017-11-18 (×2): qty 90

## 2017-11-18 MED ORDER — INSULIN ASPART 100 UNIT/ML ~~LOC~~ SOLN: 0.0000 [IU] | Freq: Three times a day (TID) | SUBCUTANEOUS | Status: DC

## 2017-11-18 MED ORDER — PHENOL 1.4 % MT LIQD
1.0000 | OROMUCOSAL | Status: DC | PRN
  Administered 2017-11-18: 1 via OROMUCOSAL
  Filled 2017-11-18: qty 177

## 2017-11-18 MED ORDER — MORPHINE SULFATE (PF) 4 MG/ML IV SOLN
4.0000 mg | INTRAVENOUS | Status: DC | PRN
  Administered 2017-11-18: 4 mg via INTRAVENOUS
  Filled 2017-11-18: qty 1

## 2017-11-18 MED ORDER — LIDOCAINE HCL 2 % EX GEL
1.0000 "application " | Freq: Once | CUTANEOUS | Status: AC
  Administered 2017-11-18: 1
  Filled 2017-11-18: qty 5

## 2017-11-18 MED ORDER — CYANOCOBALAMIN 1000 MCG/ML IJ SOLN: 1000.0000 ug | INTRAMUSCULAR | 5 refills | Status: DC

## 2017-11-18 MED ORDER — PANTOPRAZOLE SODIUM 40 MG IV SOLR
40.0000 mg | Freq: Two times a day (BID) | INTRAVENOUS | Status: DC
  Administered 2017-11-18 – 2017-11-20 (×5): 40 mg via INTRAVENOUS
  Filled 2017-11-18 (×6): qty 40

## 2017-11-18 MED ORDER — ONDANSETRON HCL 4 MG/2ML IJ SOLN
4.0000 mg | Freq: Four times a day (QID) | INTRAMUSCULAR | Status: DC | PRN
  Administered 2017-11-18 – 2017-11-19 (×4): 4 mg via INTRAVENOUS
  Filled 2017-11-18 (×4): qty 2

## 2017-11-18 MED ORDER — IOPAMIDOL (ISOVUE-300) INJECTION 61%
INTRAVENOUS | Status: AC
  Administered 2017-11-18: 100 mL via INTRAVENOUS
  Filled 2017-11-18: qty 100

## 2017-11-18 MED ORDER — IOPAMIDOL (ISOVUE-300) INJECTION 61%
100.0000 mL | Freq: Once | INTRAVENOUS | Status: AC | PRN
  Administered 2017-11-18: 100 mL via INTRAVENOUS

## 2017-11-18 MED ORDER — DEXTROSE-NACL 5-0.45 % IV SOLN
INTRAVENOUS | Status: DC
  Administered 2017-11-18 – 2017-11-20 (×5): via INTRAVENOUS

## 2017-11-18 MED ORDER — KETOROLAC TROMETHAMINE 30 MG/ML IJ SOLN
30.0000 mg | Freq: Four times a day (QID) | INTRAMUSCULAR | Status: DC | PRN
  Administered 2017-11-18 (×2): 30 mg via INTRAVENOUS
  Filled 2017-11-18 (×2): qty 1

## 2017-11-18 MED ORDER — MORPHINE SULFATE (PF) 2 MG/ML IV SOLN: 4.0000 mg | INTRAVENOUS | Status: DC | PRN

## 2017-11-18 NOTE — ED Provider Notes (Signed)
Bradley [name] Provider Note   CSN: [id number] Arrival date & time: 11/18/17  7824     History   Chief Complaint Chief Complaint  Patient presents with  . Abdominal Pain    HPI Bradley [name] is a 52 y.o. male.  HPI  52 year old comes in with chief complaint of severe abdominal pain.  Patient has history of partial small bowel obstructions, and complicated intra-abdominal injury which led to multiple surgeries.  Patient states that for the past 2 days he has been having constant abdominal pain with waxing and waning intensity.  Patient's intermittent flareups of severe pain occur every 15-30 minutes, and lasted for a few minutes.  Patient states that the pain is generalized, nonradiating.  Patient denies any associated vomiting, however he does have nausea.  Patient also denies any diarrhea, and he is passing flatus.  Patient's pain is similar to his partial small bowel obstruction pain.  This morning the pain became excruciating, patient started getting sweaty and felt like he was going to faint therefore he came to the ER.  Patient does not have any history of kidney stone and denies any flank pain, dysuria, hematuria, urinary frequency, fevers, chills.  Past Medical History:  Diagnosis Date  . Allergy   . Blood transfusion without reported diagnosis   . Cough variant asthma 10/12/2007   no per pt 06-19-16  . Elevated LFTs   . Esophageal reflux   . GERD (gastroesophageal reflux disease)   . Hiatal hernia   . Hyperlipidemia   . Irritable bowel syndrome   . LATERAL EPICONDYLITIS 03/07/2010   Qualifier: Diagnosis of  By: Elease Hashimoto MD, Bruce  left shoulder arthrits per pt 06-19-16  . Liver lesion   . Lung mass   . Personal history of colonic polyps 09/28/2009    ADENOMATOUS POLYP  . PONV (postoperative nausea and vomiting)   . Schatzki's ring   . Vitamin B 12 deficiency     Patient Active Problem List   Diagnosis Date Noted  . Small bowel obstruction ([place])  11/18/2017  . Pulmonary nodules 06/03/2016  . SBO (small bowel obstruction) ([place]) 04/11/2016  . Hypokalemia 04/11/2016  . Total bilirubin, elevated 04/11/2016  . Insomnia 05/10/2015  . Gallstones 05/10/2015  . Essential hypertension 05/10/2015  . History of colonic polyps 10/03/2010  . B12 deficiency 09/05/2009  . IBS 08/31/2009  . ALLERGIC RHINITIS, SEASONAL 07/24/2009  . External hemorrhoids 06/07/2008  . Hyperlipidemia 05/12/2007  . GERD 05/12/2007    Past Surgical History:  Procedure Laterality Date  . COLONOSCOPY    . Exploration Laparotomy and Small Bowel resection    . LEG SURGERY     left  . LUMBAR LAMINECTOMY     x3 last one June 2017  . UPPER GASTROINTESTINAL ENDOSCOPY    . VENTRAL HERNIA REPAIR    . VIDEO BRONCHOSCOPY Bilateral 06/10/2016   Procedure: VIDEO BRONCHOSCOPY WITH FLUORO;  Surgeon: Marshell Garfinkel, MD;  Location: [facility];  Service: Cardiopulmonary;  Laterality: Bilateral;  . VIDEO BRONCHOSCOPY WITH ENDOBRONCHIAL ULTRASOUND N/A 09/08/2016   Procedure: VIDEO BRONCHOSCOPY WITH ENDOBRONCHIAL ULTRASOUND;  Surgeon: Marshell Garfinkel, MD;  Location: [facility] [place];  Service: Pulmonary;  Laterality: N/A;       Home Medications    Prior to Admission medications   Medication Sig Start Date End Date Taking? Authorizing Provider  atorvastatin (LIPITOR) 10 MG tablet Take 1 tablet (10 mg total) by mouth daily. 09/03/17  Yes Marin Olp, MD  azelastine (ASTELIN) 0.1 % nasal  spray Place 2 sprays into both nostrils 2 (two) times daily. Use in each nostril as directed Patient taking differently: Place 2 sprays into both nostrils 2 (two) times daily as needed for allergies ([facility]). Use in each nostril as directed 07/14/16  Yes Mannam, Praveen, MD  clonazePAM (KLONOPIN) 0.5 MG tablet TAKE 1 TABLET AT BEDTIME AS NEEDED FOR SLEEP 06/03/17  Yes Marin Olp, MD  cyanocobalamin (,VITAMIN B-12,) 1000 MCG/ML injection Inject 1 mL (1,000 mcg total) into the skin  every 30 (thirty) days. 11/18/17  Yes Marin Olp, MD  ibuprofen (ADVIL,MOTRIN) 200 MG tablet Take 400 mg by mouth 2 (two) times daily as needed (BACK PAIN).   Yes [provider]  omeprazole (PRILOSEC) 40 MG capsule Take 1 capsule (40 mg total) by mouth daily. 08/19/17  Yes Marin Olp, MD  bisoprolol-hydrochlorothiazide [facility]) 2.5-6.25 MG per tablet Take 1 tablet by mouth daily. 10/28/11 11/28/11  Ricard Dillon, MD    Family History Family History  Problem Relation Age of Onset  . Breast cancer Mother   . Thyroid cancer Mother   . Colon cancer Neg Hx   . Esophageal cancer Neg Hx   . Rectal cancer Neg Hx   . Stomach cancer Neg Hx     Social History Social History   Tobacco Use  . Smoking status: Never Smoker  . Smokeless tobacco: Never Used  Substance Use Topics  . Alcohol use: Yes    Comment: occ.  . Drug use: No     Allergies   No known allergies   Review of Systems Review of Systems  Constitutional: Positive for activity change.  Gastrointestinal: Positive for abdominal pain.  Allergic/Immunologic: Negative for immunocompromised state.  Hematological: Does not bruise/bleed easily.  All other systems reviewed and are negative.    Physical Exam Updated Vital Signs BP 129/76   Pulse 96   Temp 98.2 F (36.8 C) (Oral)   Resp 18   Ht 5\' 11"  (1.803 m)   Wt 89 kg (196 lb 3.4 oz)   SpO2 94%   BMI 27.37 kg/m   Physical Exam  Constitutional: He is oriented to person, place, and time. He appears well-developed. He appears distressed.  Mildly distressed secondary to pain  HENT:  Head: Atraumatic.  Neck: Neck supple.  Cardiovascular: Normal rate.  Pulmonary/Chest: Effort normal.  Abdominal: Bowel sounds are normal. There is generalized tenderness. There is guarding. There is no rebound.  Neurological: He is alert and oriented to person, place, and time.  Skin: Skin is warm.  Nursing note and vitals reviewed.    ED Treatments / Results    Labs (all labs ordered are listed, but only abnormal results are displayed) Labs Reviewed  COMPREHENSIVE METABOLIC PANEL - Abnormal; Notable for the following components:      Result Value   Glucose, Bld 127 (*)    BUN 23 (*)    Calcium 8.5 (*)    Total Protein 6.4 (*)    All other components within normal limits  URINALYSIS, ROUTINE W REFLEX MICROSCOPIC - Abnormal; Notable for the following components:   Specific Gravity, Urine 1.034 (*)    All other components within normal limits  LIPASE, BLOOD  CBC    EKG  EKG Interpretation None       Radiology Ct Abdomen Pelvis W Contrast  Result Date: 11/18/2017 CLINICAL DATA:  Abdominal pain, acute, generalized. EXAM: CT ABDOMEN AND PELVIS WITH CONTRAST TECHNIQUE: Multidetector CT imaging of the abdomen  and pelvis was performed using the standard protocol following bolus administration of intravenous contrast. CONTRAST:  100 cc Isovue-300 intravenous COMPARISON:  04/11/2016 FINDINGS: Lower chest:  Negative Hepatobiliary: There are 6 liver masses with peripheral discontinuous nodular enhancement consistent with hemangiomas. These are stable from prior. The largest is in segment 6 and measures up to 6.6 cm. There are 2 closely neighboring hemangiomas in the left liver measuring up to 25 mm individually. The other hemangiomas are in the right liver are 2 cm or smaller. These are marked on series 2. Gallstones. No evidence of acute cholecystitis. Normal common bile duct diameter. Pancreas: Unremarkable. Spleen: Unremarkable. Adrenals/Urinary Tract: Negative adrenals. No hydronephrosis or stone. 4.5 cm left renal cyst. Unremarkable bladder. Stomach/Bowel: Small bowel obstruction with dilated fluid-filled bowel leading to a transition point in the right lower quadrant where there is angulation of bowel loops that shows submucosal low-density thickening and mild mesenteric edema. The obstruction is proximal to an enteroenteric anastomosis in the right  lower quadrant. The terminal ileum is negative. No appendicitis. Vascular/Lymphatic: No acute vascular abnormality. No mass or adenopathy. Reproductive:No pathologic findings. Other: Trace ascites that is likely reactive. Midline hernia repair using mesh. Musculoskeletal: No acute finding IMPRESSION: 1. High-grade small bowel obstruction with ileal transition point. Angulated bowel at the obstruction suggesting adhesive disease. Loops at the level of obstruction are edematous and this could be primary or secondary enteritis. The obstruction is proximal to an enteroenteric anastomosis which is itself unremarkable. No visible skip or terminal ileal inflammation. 2. Six liver hemangiomas measuring up to 6.6 cm in subcapsular segment 6. 3. Cholelithiasis without findings of cholecystitis. Electronically Signed   By: Monte Fantasia M.D.   On: 11/18/2017 10:15   Dg Abd Portable 1v-small Bowel Protocol-position Verification  Result Date: 11/18/2017 CLINICAL DATA:  NG tube advancement. EXAM: PORTABLE ABDOMEN - 1 VIEW COMPARISON:  One-view abdomen the same day at 12:03 p.m. FINDINGS: The NG tube has been advanced. The side port is now at the GE junction. Bowel gas pattern is normal. Lung bases are clear. IMPRESSION: Interval a VATS vent of the NG tube. The side port is now at or just beyond the GE junction. Electronically Signed   By: San Morelle M.D.   On: 11/18/2017 14:22   Dg Abd Portable 1 View  Result Date: 11/18/2017 CLINICAL DATA:  Status post nasogastric tube placement. EXAM: PORTABLE ABDOMEN - 1 VIEW COMPARISON:  Abdominal and pelvic CT scan of today's date FINDINGS: The esophagogastric tubes proximal port lies above the left hemidiaphragm. The tip lies in the gastric cardia. There is contrast within the renal collecting systems. No distended small bowel loops are observed. There are metallic coils from previous abdominal wall repair bilaterally. IMPRESSION: High positioning of the nasogastric  tube. Advancement by at least 10 cm is recommended. Electronically Signed   By: David  Martinique M.D.   On: 11/18/2017 12:24    Procedures Procedures (including critical care time)  Medications Ordered in ED Medications  ondansetron (ZOFRAN) injection 4 mg (4 mg Intravenous Given 11/18/17 1106)  sodium chloride 0.9 % injection (not administered)  dextrose 5 %-0.45 % sodium chloride infusion ( Intravenous New Bag/Given 11/18/17 1222)  ketorolac (TORADOL) 30 MG/ML injection 30 mg (30 mg Intravenous Given 11/18/17 1339)  insulin aspart (novoLOG) injection 0-9 Units (not administered)  pantoprazole (PROTONIX) injection 40 mg (40 mg Intravenous Given 11/18/17 1302)  diatrizoate meglumine-sodium (GASTROGRAFIN) 66-10 % solution 90 mL (not administered)  morphine 2 MG/ML injection 4 mg (not administered)  sodium chloride 0.9 % bolus 1,000 mL (0 mLs Intravenous Stopped 11/18/17 0923)  morphine 4 MG/ML injection 4 mg (4 mg Intravenous Given 11/18/17 0815)  iopamidol (ISOVUE-300) 61 % injection 100 mL (100 mLs Intravenous Contrast Given 11/18/17 0923)  lidocaine (XYLOCAINE) 2 % jelly 1 application (1 application Other Given 11/18/17 1108)  metoCLOPramide (REGLAN) injection 10 mg (10 mg Intravenous Given 11/18/17 1357)     Initial Impression / Assessment and Plan / ED Course  I have reviewed the triage vital signs and the nursing notes.  Pertinent labs & imaging results that were available during my care of the patient were reviewed by me and considered in my medical decision making (see chart for details).  Clinical Course as of Nov 18 1625  Wed Nov 18, 2017  1030 Results from the ER workup discussed with the patient face to face and all questions answered to the best of my ability.  Medicine consulted. NG tube ordered. Will consulter Surgery if medicine requests. CT ABDOMEN PELVIS W CONTRAST [AN]    Clinical Course User Index [AN] Varney Biles, MD    52 year old male comes in with chief  complaint of abdominal pain. Patient appears slightly distressed secondary to pain.  Patient has history of abdominal surgeries and small bowel obstruction.  He reports symptoms that are similar to his small bowel obstruction.  Patient's physical exam reveals generalized abdominal tenderness with guarding, but no rebound tenderness.  Patient allegedly got bradycardic, diaphoretic and felt like he was going to faint prior to ED arrival.  Sounds like a vasovagal response due to severe pain.  At this time patient is hemodynamically stable.  Based on prior history, and the examination finding right now we will get a CT scan of the abdomen and reassess.  Final Clinical Impressions(s) / ED Diagnoses   Final diagnoses:  SBO (small bowel obstruction) [facility])    ED Discharge Orders    None       Varney Biles, MD 11/18/17 1627

## 2017-11-18 NOTE — Telephone Encounter (Signed)
error 

## 2017-11-18 NOTE — ED Notes (Signed)
Report given to [id number].

## 2017-11-18 NOTE — ED Notes (Signed)
ED TO INPATIENT HANDOFF REPORT  Name/Age/Gender Bradley [name] 52 y.o. male  Code Status    Code Status Orders  (From admission, onward)        Start     Ordered   11/18/17 1213  Full code  Continuous     11/18/17 1214    Code Status History    Date Active Date Inactive Code Status Order ID Comments User Context   04/11/2016 05:23 04/13/2016 14:27 Full Code [id number]  Edwin Dada, MD Inpatient      Home/SNF/Other Home  Chief Complaint ABD PAIN  Level of Care/Admitting Diagnosis ED Disposition    ED Disposition Condition [place]: [facility] [100102]  Level of Care: Med-Surg [16]  Diagnosis: Small bowel obstruction [facility]) [[id number]]  Admitting Physician: Gerlean Ren [facility] [[id number]]  Attending Physician: Gerlean Ren [facility] [[id number]]  Estimated length of stay: past midnight tomorrow  Certification:: I certify this patient will need inpatient services for at least 2 midnights  PT Class (Do Not Modify): Inpatient [101]  PT Acc Code (Do Not Modify): Private [1]       Medical History Past Medical History:  Diagnosis Date  . Allergy   . Blood transfusion without reported diagnosis   . Cough variant asthma 10/12/2007   no per pt 06-19-16  . Elevated LFTs   . Esophageal reflux   . GERD (gastroesophageal reflux disease)   . Hiatal hernia   . Hyperlipidemia   . Irritable bowel syndrome   . LATERAL EPICONDYLITIS 03/07/2010   Qualifier: Diagnosis of  By: Elease Hashimoto MD, [place]  left shoulder arthrits per pt 06-19-16  . Liver lesion   . Lung mass   . Personal history of colonic polyps 09/28/2009    ADENOMATOUS POLYP  . PONV (postoperative nausea and vomiting)   . Schatzki's ring   . Vitamin B 12 deficiency     Allergies Allergies  Allergen Reactions  . No Known Allergies     IV Location/Drains/Wounds Patient Lines/Drains/Airways Status   Active Line/Drains/Airways    Name:   Placement date:   Placement time:   Site:    Days:   Peripheral IV 11/18/17 Left Antecubital   11/18/17    0729    Antecubital   less than 1   NG/OG Tube Nasogastric 14 Fr. Right nare Xray;Aucultation Documented cm marking at nare/ corner of mouth    11/18/17    1135    Right nare   less than 1   Incision (Closed) 09/08/16 Other (Comment) Other (Comment)   09/08/16    1019     436          Labs/Imaging Results for orders placed or performed during the hospital encounter of 11/18/17 (from the past 48 hour(s))  Lipase, blood     Status: None   Collection Time: 11/18/17  7:43 AM  Result Value Ref Range   Lipase 21 11 - 51 U/L    Comment: Performed at [facility], [place] [address] [place], [place] [address]  Comprehensive metabolic panel     Status: Abnormal   Collection Time: 11/18/17  7:43 AM  Result Value Ref Range   Sodium 140 135 - 145 mmol/L   Potassium 3.9 3.5 - 5.1 mmol/L   Chloride 105 101 - 111 mmol/L   CO2 27 22 - 32 mmol/L   Glucose, Bld 127 (H) 65 - 99 mg/dL   BUN 23 (H) 6 - 20  mg/dL   Creatinine, Ser 1.11 0.61 - 1.24 mg/dL   Calcium 8.5 (L) 8.9 - 10.3 mg/dL   Total Protein 6.4 (L) 6.5 - 8.1 g/dL   Albumin 4.0 3.5 - 5.0 g/dL   AST 29 15 - 41 U/L   ALT 42 17 - 63 U/L   Alkaline Phosphatase 83 38 - 126 U/L   Total Bilirubin 1.0 0.3 - 1.2 mg/dL   GFR calc non Af Amer >60 >60 mL/min   GFR calc Af Amer >60 >60 mL/min    Comment: (NOTE) The eGFR has been calculated using the CKD EPI equation. This calculation has not been validated in all clinical situations. eGFR's persistently <60 mL/min signify possible Chronic Kidney Disease.    Anion gap 8 5 - 15    Comment: Performed at [facility], [place] [address] [place], [place] [address]  CBC     Status: None   Collection Time: 11/18/17  7:43 AM  Result Value Ref Range   WBC 9.0 4.0 - 10.5 K/uL   RBC 4.70 4.22 - 5.81 MIL/uL   Hemoglobin 14.7 13.0 - 17.0 g/dL   HCT 42.2 39.0 - 52.0 %   MCV 89.8 78.0 - 100.0 fL   MCH 31.3  26.0 - 34.0 pg   MCHC 34.8 30.0 - 36.0 g/dL   RDW 12.2 11.5 - 15.5 %   Platelets 169 150 - 400 K/uL    Comment: Performed at [facility], [place] [address] [place], [place] [address]  Urinalysis, Routine w reflex microscopic     Status: Abnormal   Collection Time: 11/18/17 10:01 AM  Result Value Ref Range   Color, Urine YELLOW YELLOW   APPearance CLEAR CLEAR   Specific Gravity, Urine 1.034 (H) 1.005 - 1.030   pH 7.0 5.0 - 8.0   Glucose, UA NEGATIVE NEGATIVE mg/dL   Hgb urine dipstick NEGATIVE NEGATIVE   Bilirubin Urine NEGATIVE NEGATIVE   Ketones, ur NEGATIVE NEGATIVE mg/dL   Protein, ur NEGATIVE NEGATIVE mg/dL   Nitrite NEGATIVE NEGATIVE   Leukocytes, UA NEGATIVE NEGATIVE    Comment: Performed at [place] [address], [place], [place] [address]   Ct Abdomen Pelvis W Contrast  Result Date: 11/18/2017 CLINICAL DATA:  Abdominal pain, acute, generalized. EXAM: CT ABDOMEN AND PELVIS WITH CONTRAST TECHNIQUE: Multidetector CT imaging of the abdomen and pelvis was performed using the standard protocol following bolus administration of intravenous contrast. CONTRAST:  100 cc Isovue-300 intravenous COMPARISON:  04/11/2016 FINDINGS: Lower chest:  Negative Hepatobiliary: There are 6 liver masses with peripheral discontinuous nodular enhancement consistent with hemangiomas. These are stable from prior. The largest is in segment 6 and measures up to 6.6 cm. There are 2 closely neighboring hemangiomas in the left liver measuring up to 25 mm individually. The other hemangiomas are in the right liver are 2 cm or smaller. These are marked on series 2. Gallstones. No evidence of acute cholecystitis. Normal common bile duct diameter. Pancreas: Unremarkable. Spleen: Unremarkable. Adrenals/Urinary Tract: Negative adrenals. No hydronephrosis or stone. 4.5 cm left renal cyst. Unremarkable bladder. Stomach/Bowel: Small bowel obstruction with dilated fluid-filled bowel  leading to a transition point in the right lower quadrant where there is angulation of bowel loops that shows submucosal low-density thickening and mild mesenteric edema. The obstruction is proximal to an enteroenteric anastomosis in the right lower quadrant. The terminal ileum is negative. No appendicitis. Vascular/Lymphatic: No acute vascular abnormality. No mass or adenopathy. Reproductive:No pathologic findings. Other: Trace  ascites that is likely reactive. Midline hernia repair using mesh. Musculoskeletal: No acute finding IMPRESSION: 1. High-grade small bowel obstruction with ileal transition point. Angulated bowel at the obstruction suggesting adhesive disease. Loops at the level of obstruction are edematous and this could be primary or secondary enteritis. The obstruction is proximal to an enteroenteric anastomosis which is itself unremarkable. No visible skip or terminal ileal inflammation. 2. Six liver hemangiomas measuring up to 6.6 cm in subcapsular segment 6. 3. Cholelithiasis without findings of cholecystitis. Electronically Signed   By: Monte Fantasia M.D.   On: 11/18/2017 10:15   Dg Abd Portable 1v-small Bowel Protocol-position Verification  Result Date: 11/18/2017 CLINICAL DATA:  NG tube advancement. EXAM: PORTABLE ABDOMEN - 1 VIEW COMPARISON:  One-view abdomen the same day at 12:03 p.m. FINDINGS: The NG tube has been advanced. The side port is now at the GE junction. Bowel gas pattern is normal. Lung bases are clear. IMPRESSION: Interval a VATS vent of the NG tube. The side port is now at or just beyond the GE junction. Electronically Signed   By: San Morelle M.D.   On: 11/18/2017 14:22   Dg Abd Portable 1 View  Result Date: 11/18/2017 CLINICAL DATA:  Status post nasogastric tube placement. EXAM: PORTABLE ABDOMEN - 1 VIEW COMPARISON:  Abdominal and pelvic CT scan of today's date FINDINGS: The esophagogastric tubes proximal port lies above the left hemidiaphragm. The tip lies in  the gastric cardia. There is contrast within the renal collecting systems. No distended small bowel loops are observed. There are metallic coils from previous abdominal wall repair bilaterally. IMPRESSION: High positioning of the nasogastric tube. Advancement by at least 10 cm is recommended. Electronically Signed   By: David  Martinique M.D.   On: 11/18/2017 12:24    Pending Labs Unresulted Labs (From admission, onward)   Start     Ordered   11/19/17 0500  Comprehensive metabolic panel  Tomorrow morning,   R     11/18/17 1214   11/19/17 0500  Magnesium  Tomorrow morning,   R     11/18/17 1214      Vitals/Pain Today's Vitals   11/18/17 1230 11/18/17 1300 11/18/17 1330 11/18/17 1400  BP: 123/76 118/71 130/81 132/82  Pulse: 71 86 75 78  Resp:  _0 Temp:      TempSrc:      SpO2: 93% 94% 94% 94%  PainSc:        Isolation Precautions No active isolations  Medications Medications  ondansetron (ZOFRAN) injection 4 mg (4 mg Intravenous Given 11/18/17 1106)  morphine 4 MG/ML injection 4 mg (4 mg Intravenous Given 11/18/17 1106)  sodium chloride 0.9 % injection (not administered)  dextrose 5 %-0.45 % sodium chloride infusion ( Intravenous New Bag/Given 11/18/17 1222)  ketorolac (TORADOL) 30 MG/ML injection 30 mg (30 mg Intravenous Given 11/18/17 1339)  insulin aspart (novoLOG) injection 0-9 Units (not administered)  pantoprazole (PROTONIX) injection 40 mg (40 mg Intravenous Given 11/18/17 1302)  diatrizoate meglumine-sodium (GASTROGRAFIN) 66-10 % solution 90 mL (not administered)  sodium chloride 0.9 % bolus 1,000 mL (0 mLs Intravenous Stopped 11/18/17 0923)  morphine 4 MG/ML injection 4 mg (4 mg Intravenous Given 11/18/17 0815)  iopamidol (ISOVUE-300) 61 % injection 100 mL (100 mLs Intravenous Contrast Given 11/18/17 0923)  lidocaine (XYLOCAINE) 2 % jelly 1 application (1 application Other Given 11/18/17 1108)  metoCLOPramide (REGLAN) injection 10 mg (10 mg Intravenous Given 11/18/17 1357)     Mobility walks
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
## 2017-11-18 NOTE — ED Notes (Signed)
Bed: RESB Expected date:  Expected time:  Means of arrival:  Comments: [place]/abd. pain
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
## 2017-11-19 ENCOUNTER — Inpatient Hospital Stay (HOSPITAL_COMMUNITY): Payer: 59

## 2017-11-19 LAB — COMPREHENSIVE METABOLIC PANEL
ALK PHOS: 74 U/L (ref 38–126)
ALT: 40 U/L (ref 17–63)
AST: 26 U/L (ref 15–41)
Albumin: 3.6 g/dL (ref 3.5–5.0)
Anion gap: 7 (ref 5–15)
BUN: 21 mg/dL — ABNORMAL HIGH (ref 6–20)
CALCIUM: 8.3 mg/dL — AB (ref 8.9–10.3)
CHLORIDE: 109 mmol/L (ref 101–111)
CO2: 28 mmol/L (ref 22–32)
CREATININE: 0.87 mg/dL (ref 0.61–1.24)
Glucose, Bld: 125 mg/dL — ABNORMAL HIGH (ref 65–99)
Potassium: 3.7 mmol/L (ref 3.5–5.1)
Sodium: 144 mmol/L (ref 135–145)
TOTAL PROTEIN: 5.9 g/dL — AB (ref 6.5–8.1)
Total Bilirubin: 0.9 mg/dL (ref 0.3–1.2)

## 2017-11-19 LAB — GLUCOSE, CAPILLARY
GLUCOSE-CAPILLARY: 91 mg/dL (ref 65–99)
Glucose-Capillary: 102 mg/dL — ABNORMAL HIGH (ref 65–99)
Glucose-Capillary: 105 mg/dL — ABNORMAL HIGH (ref 65–99)
Glucose-Capillary: 116 mg/dL — ABNORMAL HIGH (ref 65–99)

## 2017-11-19 LAB — MAGNESIUM: MAGNESIUM: 2 mg/dL (ref 1.7–2.4)
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
## 2017-11-19 NOTE — Progress Notes (Signed)
Patient is transferred from [place] at 1750. Alert and oriented x4.  Vital signs was taken. No pain complaint. Spouse at bedside.
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
## 2017-11-19 NOTE — Progress Notes (Signed)
Patient no longer NPO, tolerating full liquid diet without difficulty. Orders for CBG state while NPO. Midnight CBG not needed. Will c/t monitor

## 2017-11-20 DIAGNOSIS — K56609 Unspecified intestinal obstruction, unspecified as to partial versus complete obstruction: Secondary | ICD-10-CM

## 2017-11-20 LAB — GLUCOSE, CAPILLARY: Glucose-Capillary: 79 mg/dL (ref 65–99)

## 2017-11-20 NOTE — Discharge Instructions (Signed)
Small Bowel Obstruction A small bowel obstruction means that something is blocking the small bowel. The small bowel is also called the small intestine. It is the long tube that connects the stomach to the colon. An obstruction will stop food and fluids from passing through the small bowel. Treatment depends on what is causing the problem and how bad the problem is. Follow these instructions at home:  Get a lot of rest.  Follow your diet as told by your doctor. You may need to: ? Only drink clear liquids until you start to get better. ? Avoid solid foods as told by your doctor.  Take over-the-counter and prescription medicines only as told by your doctor.  Keep all follow-up visits as told by your doctor. This is important. Contact a doctor if:  You have a fever.  You have chills. Get help right away if:  You have pain or cramps that get worse.  You throw up (vomit) blood.  You have a feeling of being sick to your stomach (nausea) that does not go away.  You cannot stop throwing up.  You cannot drink fluids.  You feel confused.  You feel dry or thirsty (dehydrated).  Your belly gets more bloated.  You feel weak or you pass out (faint). This information is not intended to replace advice given to you by your health care provider. Make sure you discuss any questions you have with your health care provider. Document Released: 10/16/2004 Document Revised: 05/05/2016 Document Reviewed: 11/02/2014 Elsevier Interactive Patient Education  2018 [employer].   Full Liquid Diet A full liquid diet may be used:  To help you transition from a clear liquid diet to a soft diet.  When your body is healing and can only tolerate foods that are easy to digest.  Before or after certain a procedure, test, or surgery (such as stomach or intestinal surgeries).  If you have trouble swallowing or chewing.  A full liquid diet includes fluids and foods that are liquid or will become liquid  at room temperature. The full liquid diet gives you the proteins, fluids, salts, and minerals that you need for energy. If you continue this diet for more than 72 hours, talk to your health care provider about how many calories you need to consume. If you continue the diet for more than 5 days, talk to your health care provider about taking a multivitamin or a nutritional supplement. What do I need to know about a full liquid diet?  You may have any liquid.  You may have any food that becomes a liquid at room temperature. The food is considered a liquid if it can be poured off a spoon at room temperature.  Drink one serving of citrus or vitamin C-enriched fruit juice daily. What foods can I eat? Grains Any grain food that can be pureed in soup (such as crackers, pasta, and rice). Hot cereal (such as farina or oatmeal) that has been blended. Talk to your health care provider or dietitian about these foods. Vegetables Pulp-free tomato or vegetable juice. Vegetables pureed in soup. Fruits Fruit juice, including nectars and juices with pulp. Meats and Other Protein Sources Eggs in custard, eggnog mix, and eggs used in ice cream or pudding. Strained meats, like in baby food, may be allowed. Consult your health care provider. Dairy Milk and milk-based beverages, including milk shakes and instant breakfast mixes. Smooth yogurt. Pureed cottage cheese. Avoid these foods if they are not well tolerated. Beverages All beverages, including liquid  nutritional supplements. Ask your health care provider if you can have carbonated beverages. They may not be well tolerated. Condiments Iodized salt, pepper, spices, and flavorings. Cocoa powder. Vinegar, ketchup, yellow mustard, smooth sauces (such as hollandaise, cheese sauce, or white sauce), and soy sauce. Sweets and Desserts Custard, smooth pudding. Flavored gelatin. Tapioca, junket. Plain ice cream, sherbet, fruit ices. Frozen ice pops, frozen fudge pops,  pudding pops, and other frozen bars with cream. Syrups, including chocolate syrup. Sugar, honey, jelly. Fats and Oils Margarine, butter, cream, sour cream, and oils. Other Broth and cream soups. Strained, broth-based soups. The items listed above may not be a complete list of recommended foods or beverages. Contact your dietitian for more options. What foods can I not eat? Grains All breads. Grains are not allowed unless they are pureed into soup. Vegetables Vegetables are not allowed unless they are juiced, or cooked and pureed into soup. Fruits Fruits are not allowed unless they are juiced. Meats and Other Protein Sources Any meat or fish. Cooked or raw eggs. Nut butters. Dairy Cheese. Condiments Stone ground mustards. Fats and Oils Fats that are coarse or chunky. Sweets and Desserts Ice cream or other frozen desserts that have any solids in them or on top, such as nuts, chocolate chips, and pieces of cookies. Cakes. Cookies. Candy. Others Soups with chunks or pieces in them. The items listed above may not be a complete list of foods and beverages to avoid. Contact your dietitian for more information. This information is not intended to replace advice given to you by your health care provider. Make sure you discuss any questions you have with your health care provider. Document Released: 09/08/2005 Document Revised: 02/14/2016 Document Reviewed: 07/14/2013 Elsevier Interactive Patient Education  2017 [place] Meal Plan A soft-food meal plan includes foods that are safe and easy to swallow. This meal plan typically is used:  If you are having trouble chewing or swallowing foods.  As a transition meal plan after only having had liquid meals for a long period.  What do I need to know about the soft-food meal plan? A soft-food meal plan includes tender foods that are soft and easy to chew and swallow. In most cases, bite-sized pieces of food are easier to swallow.  A bite-sized piece is about  inch or smaller. Foods in this plan do not need to be ground or pureed. Foods that are very hard, crunchy, or sticky should be avoided. Also, breads, cereals, yogurts, and desserts with nuts, seeds, or fruits should be avoided. What foods can I eat? Grains Rice and wild rice. Moist bread, dressing, pasta, and noodles. Well-moistened dry or cooked cereals, such as farina (cooked wheat cereal), oatmeal, or grits. Biscuits, breads, muffins, pancakes, and waffles that have been well moistened. Vegetables Shredded lettuce. Cooked, tender vegetables, including potatoes without skins. Vegetable juices. Broths or creamed soups made with vegetables that are not stringy or chewy. Strained tomatoes (without seeds). Fruits Canned or well-cooked fruits. Soft (ripe), peeled fresh fruits, such as peaches, nectarines, kiwi, cantaloupe, honeydew melon, and watermelon (without seeds). Soft berries with small seeds, such as strawberries. Fruit juices (without pulp). Meats and Other Protein Sources Moist, tender, lean beef. Mutton. Lamb. Veal. Chicken. [place]. Liver. Ham. Fish without bones. Eggs. Dairy Milk, milk drinks, and cream. Plain cream cheese and cottage cheese. Plain yogurt. Sweets/Desserts Flavored gelatin desserts. Custard. Plain ice cream, frozen yogurt, sherbet, milk shakes, and malts. Plain cakes and cookies. Plain hard candy. Other Butter, margarine (without  trans fat), and cooking oils. Mayonnaise. Cream sauces. Mild spices, salt, and sugar. Syrup, molasses, honey, and jelly. The items listed above may not be a complete list of recommended foods or beverages. Contact your dietitian for more options. What foods are not recommended? Grains Dry bread, toast, crackers that have not been moistened. Coarse or dry cereals, such as bran, granola, and shredded wheat. Tough or chewy crusty breads, such as [place] bread or baguettes. Vegetables Corn. Raw vegetables except  shredded lettuce. Cooked vegetables that are tough or stringy. Tough, crisp, fried potatoes and potato skins. Fruits Fresh fruits with skins or seeds or both, such as apples, pears, or grapes. Stringy, high-pulp fruits, such as papaya, pineapple, coconut, or mango. Fruit leather, fruit roll-ups, and all dried fruits. Meats and Other Protein Sources Sausages and hot dogs. Meats with gristle. Fish with bones. Nuts, seeds, and chunky peanut or other nut butters. Sweets/Desserts Cakes or cookies that are very dry or chewy. The items listed above may not be a complete list of foods and beverages to avoid. Contact your dietitian for more information. This information is not intended to replace advice given to you by your health care provider. Make sure you discuss any questions you have with your health care provider. Document Released: 12/16/2007 Document Revised: 02/14/2016 Document Reviewed: 08/05/2013 Elsevier Interactive Patient Education  2017 [employer].
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
## 2017-11-23 ENCOUNTER — Telehealth: Payer: Self-pay | Admitting: *Deleted

## 2017-11-23 NOTE — Telephone Encounter (Signed)
Attempted to return patient's call. Left voicemail.

## 2017-11-23 NOTE — Telephone Encounter (Signed)
Per chart review:  PCP: Marin Olp, MD  Admit date: 11/18/2017 Discharge date: 11/20/2017  Time spent: <17mins  Recommendations for Outpatient Follow-up:  1. F/u with PMD within a week  for hospital discharge follow up, repeat cbc/bmp at follow up  Discharge Diagnoses:      Active Hospital Problems   Diagnosis Date Noted  . Small bowel obstruction ([place]) 11/18/2017    Resolved Hospital Problems  No resolved problems to display.    Discharge Condition: stable  Diet recommendation: heart healthy/carb modified ______________________________________________________________________________  Transition Care Management Follow-up Telephone Call   Date discharged? 11/20/17   How have you been since you were released from the hospital? "pretty good"    Do you understand why you were in the hospital? yes   Do you understand the discharge instructions? yes   Where were you discharged to? Home   Items Reviewed:  Medications reviewed: yes  Allergies reviewed: yes  Dietary changes reviewed: yes  Referrals reviewed: yes   Functional Questionnaire:   Activities of Daily Living (ADLs):   He states they are independent in the following: ambulation, bathing and hygiene, feeding, continence, grooming, toileting and dressing States they require assistance with the following: None.    Any transportation issues/concerns?: no   Any patient concerns? No. Patient states that he is eating a soft diet until Wednesday. States he had a normal bowel movement this morning.   Confirmed importance and date/time of follow-up visits scheduled yes  Provider Appointment booked with Inda Coke, [place] 11/24/17 8:30  Confirmed with patient if condition begins to worsen call PCP or go to the ER.  Patient was given the office number and encouraged to call back with question or concerns.  : yes

## 2017-11-23 NOTE — Telephone Encounter (Signed)
Left voicemail requesting call back to complete TCM call.

## 2017-11-24 ENCOUNTER — Ambulatory Visit: Payer: 59 | Admitting: Physician Assistant

## 2017-11-24 ENCOUNTER — Encounter: Payer: Self-pay | Admitting: Physician Assistant

## 2017-11-24 VITALS — BP 150/96 | HR 69 | Temp 97.8°F | Ht 71.0 in | Wt 195.0 lb

## 2017-11-24 DIAGNOSIS — K56609 Unspecified intestinal obstruction, unspecified as to partial versus complete obstruction: Secondary | ICD-10-CM

## 2017-11-24 DIAGNOSIS — Z09 Encounter for follow-up examination after completed treatment for conditions other than malignant neoplasm: Secondary | ICD-10-CM | POA: Diagnosis not present

## 2017-11-24 DIAGNOSIS — I10 Essential (primary) hypertension: Secondary | ICD-10-CM

## 2017-11-24 DIAGNOSIS — D1803 Hemangioma of intra-abdominal structures: Secondary | ICD-10-CM | POA: Diagnosis not present

## 2017-11-24 LAB — COMPREHENSIVE METABOLIC PANEL
ALBUMIN: 4.6 g/dL (ref 3.5–5.2)
ALK PHOS: 82 U/L (ref 39–117)
ALT: 28 U/L (ref 0–53)
AST: 17 U/L (ref 0–37)
BILIRUBIN TOTAL: 0.6 mg/dL (ref 0.2–1.2)
BUN: 14 mg/dL (ref 6–23)
CALCIUM: 9.7 mg/dL (ref 8.4–10.5)
CO2: 31 mEq/L (ref 19–32)
Chloride: 104 mEq/L (ref 96–112)
Creatinine, Ser: 0.95 mg/dL (ref 0.40–1.50)
GFR: 88.05 mL/min (ref >60.00)
Glucose, Bld: 90 mg/dL (ref 70–99)
POTASSIUM: 3.8 meq/L (ref 3.5–5.1)
Sodium: 141 mEq/L (ref 135–145)
TOTAL PROTEIN: 7.2 g/dL (ref 6.0–8.3)

## 2017-11-24 LAB — CBC WITH DIFFERENTIAL/PLATELET
Basophils Absolute: 0 10*3/uL (ref 0.0–0.1)
Basophils Relative: 0.4 % (ref 0.0–3.0)
Eosinophils Absolute: 0.1 10*3/uL (ref 0.0–0.7)
Eosinophils Relative: 1.4 % (ref 0.0–5.0)
HEMATOCRIT: 45.7 % (ref 39.0–52.0)
HEMOGLOBIN: 16 g/dL (ref 13.0–17.0)
LYMPHS ABS: 0.8 10*3/uL (ref 0.7–4.0)
LYMPHS PCT: 15.4 % (ref 12.0–46.0)
MCHC: 35.1 g/dL (ref 30.0–36.0)
MCV: 90 fl (ref 78.0–100.0)
MONOS PCT: 9.8 % (ref 3.0–12.0)
Monocytes Absolute: 0.5 10*3/uL (ref 0.1–1.0)
NEUTROS PCT: 73 % (ref 43.0–77.0)
Neutro Abs: 3.9 10*3/uL (ref 1.4–7.7)
Platelets: 247 10*3/uL (ref 150.0–400.0)
RBC: 5.08 Mil/uL (ref 4.22–5.81)
RDW: 12.7 % (ref 11.5–15.5)
WBC: 5.3 10*3/uL (ref 4.0–10.5)

## 2017-11-24 NOTE — Assessment & Plan Note (Signed)
Blood pressure is elevated. He endorses white coat syndrome. I recommended that he follow-up with [place] in 2 weeks, keep a blood pressure log. Avoid salty foods. Consider resuming HCTZ 25 mg. He is going to bring in his cuff at next visit to make sure it is accurate. Asymptomatic. No red flags on exam. BP Readings from Last 3 Encounters:  11/24/17 (!) 150/96  11/20/17 135/83  08/19/17 (!) 138/92

## 2017-11-24 NOTE — Assessment & Plan Note (Signed)
Incidental finding on 11/18/2017 hospital CT. With recommendation for follow-up in 6-12 months. I will defer further following of this to PCP.

## 2017-11-24 NOTE — Progress Notes (Signed)
Bradley [name] is a 52 y.o. male is here to discuss: Hospital follow up for Small Bowel Obstruction.  I acted as a Education administrator for [employer], PA-C Anselmo Pickler, LPN  History of Present Illness:   Chief Complaint  Patient presents with  . Hospitalization Follow-up    Adm 11/18/2017    HPI  Pt here today for Hospital follow up, Admission was 11/18/2017 for Small Bowel Obstruction. He did not require surgery, and was able to improve with NG decompression alone. CT scan on admission did show multiple liver hemangiomas up to 6.6 cm and it was recommended that he have follow-up surveillance in 6-12 months. Pt denies abdominal pain, no nausea or vomiting, moving bowels without difficulty stool is soft. Normal appetite. Passing flatus.  Since he has left the hospital, he has been able to eat grilled chicken, brown rice, string beans, gravy biscuit, etc. He does have a scheduled visit to see his primary care provider, Dr. Yong [name] in May.  Based on hospital records his blood pressure appeared to be relatively well-controlled. He continues to deal with intermittent elevated blood pressure readings in our office. He does state that Dr. Yong [name] has asked him to bring his blood pressure cuff with him in May so he can compare it to our blood pressure cuff. He reports that his readings prior to hospitalization were approximately 127-131/77-80. He denies chest pain, shortness of breath, unusual headaches, dizziness, blurred vision. Per records he was on Ziac at one point.  I reviewed labs and records from his hospitalization. He did have a slight bump in his BUN. Most recent was 21, we will recheck today.   There are no preventive care reminders to display for this patient.  Past Medical History:  Diagnosis Date  . Allergy   . Blood transfusion without reported diagnosis   . Cough variant asthma 10/12/2007   no per pt 06-19-16  . Elevated LFTs   . Esophageal reflux   . GERD (gastroesophageal reflux  disease)   . Hiatal hernia   . Hyperlipidemia   . Irritable bowel syndrome   . LATERAL EPICONDYLITIS 03/07/2010   Qualifier: Diagnosis of  By: Elease Hashimoto MD, [place]  left shoulder arthrits per pt 06-19-16  . Liver lesion   . Lung mass   . Personal history of colonic polyps 09/28/2009    ADENOMATOUS POLYP  . PONV (postoperative nausea and vomiting)   . Schatzki's ring   . Vitamin B 12 deficiency      Social History   Socioeconomic History  . Marital status: Married    Spouse name: Not on file  . Number of children: 2  . Years of education: Not on file  . Highest education level: Not on file  Social Needs  . Financial resource strain: Not on file  . Food insecurity - worry: Not on file  . Food insecurity - inability: Not on file  . Transportation needs - medical: Not on file  . Transportation needs - non-medical: Not on file  Occupational History  . Occupation: ELECTRICIAN    Employer: [employer]  Tobacco Use  . Smoking status: Never Smoker  . Smokeless tobacco: Never Used  Substance and Sexual Activity  . Alcohol use: Yes    Comment: occ.  . Drug use: No  . Sexual activity: Not on file  Other Topics Concern  . Not on file  Social History Narrative   Married, lives with wife and son   OCCUPATION: Physiological scientist  Past Surgical History:  Procedure Laterality Date  . COLONOSCOPY    . Exploration Laparotomy and Small Bowel resection    . LEG SURGERY     left  . LUMBAR LAMINECTOMY     x3 last one June 2017  . UPPER GASTROINTESTINAL ENDOSCOPY    . VENTRAL HERNIA REPAIR    . VIDEO BRONCHOSCOPY Bilateral 06/10/2016   Procedure: VIDEO BRONCHOSCOPY WITH FLUORO;  Surgeon: Marshell Garfinkel, MD;  Location: [facility];  Service: Cardiopulmonary;  Laterality: Bilateral;  . VIDEO BRONCHOSCOPY WITH ENDOBRONCHIAL ULTRASOUND N/A 09/08/2016   Procedure: VIDEO BRONCHOSCOPY WITH ENDOBRONCHIAL ULTRASOUND;  Surgeon: Marshell Garfinkel, MD;  Location: [facility];  Service: Pulmonary;   Laterality: N/A;    Family History  Problem Relation Age of Onset  . Breast cancer Mother   . Thyroid cancer Mother   . Colon cancer Neg Hx   . Esophageal cancer Neg Hx   . Rectal cancer Neg Hx   . Stomach cancer Neg Hx     PMHx, SurgHx, SocialHx, FamHx, Medications, and Allergies were reviewed in the Visit Navigator and updated as appropriate.   Patient Active Problem List   Diagnosis Date Noted  . Hemangioma of liver 11/24/2017  . Small bowel obstruction ([place]) 11/18/2017  . Pulmonary nodules 06/03/2016  . SBO (small bowel obstruction) ([place]) 04/11/2016  . Hypokalemia 04/11/2016  . Total bilirubin, elevated 04/11/2016  . Insomnia 05/10/2015  . Gallstones 05/10/2015  . Essential hypertension 05/10/2015  . History of colonic polyps 10/03/2010  . B12 deficiency 09/05/2009  . IBS 08/31/2009  . ALLERGIC RHINITIS, SEASONAL 07/24/2009  . External hemorrhoids 06/07/2008  . Hyperlipidemia 05/12/2007  . GERD 05/12/2007    Social History   Tobacco Use  . Smoking status: Never Smoker  . Smokeless tobacco: Never Used  Substance Use Topics  . Alcohol use: Yes    Comment: occ.  . Drug use: No    Current Medications and Allergies:    Current Outpatient Medications:  .  atorvastatin (LIPITOR) 10 MG tablet, Take 1 tablet (10 mg total) by mouth daily., Disp: 90 tablet, Rfl: 3 .  azelastine (ASTELIN) 0.1 % nasal spray, Place 2 sprays into both nostrils 2 (two) times daily. Use in each nostril as directed (Patient taking differently: Place 2 sprays into both nostrils 2 (two) times daily as needed for allergies ([facility]). Use in each nostril as directed), Disp: 30 mL, Rfl: 3 .  clonazePAM (KLONOPIN) 0.5 MG tablet, TAKE 1 TABLET AT BEDTIME AS NEEDED FOR SLEEP, Disp: 30 tablet, Rfl: 2 .  cyanocobalamin (,VITAMIN B-12,) 1000 MCG/ML injection, Inject 1 mL (1,000 mcg total) into the skin every 30 (thirty) days., Disp: 1 mL, Rfl: 5 .  ibuprofen (ADVIL,MOTRIN) 200 MG tablet, Take 400  mg by mouth 2 (two) times daily as needed (BACK PAIN)., Disp: , Rfl:  .  omeprazole (PRILOSEC) 40 MG capsule, Take 1 capsule (40 mg total) by mouth daily., Disp: 90 capsule, Rfl: 3   Allergies  Allergen Reactions  . No Known Allergies     Review of Systems   ROS  Negative unless otherwise specified per history of present illness   Vitals:   Vitals:   11/24/17 0822 11/24/17 0847  BP: (!) 160/100 (!) 150/96  Pulse: 69   Temp: 97.8 F (36.6 C)   TempSrc: Oral   SpO2: 97%   Weight: 195 lb (88.5 kg)   Height: 5\' 11"  (1.803 m)      Body mass index is 27.2 kg/m.  Physical Exam:    Physical Exam  Constitutional: He appears well-developed. He is cooperative.  Non-toxic appearance. He does not have a sickly appearance. He does not appear ill. No distress.  Cardiovascular: Normal rate, regular rhythm, S1 normal, S2 normal, normal heart sounds and normal pulses.  No LE edema  Pulmonary/Chest: Effort normal and breath sounds normal.  Abdominal: Normal appearance and bowel sounds are normal. There is no tenderness.  Bowel sounds present in all 4 quadrants.  Neurological: He is alert. GCS eye subscore is 4. GCS verbal subscore is 5. GCS motor subscore is 6.  Skin: Skin is warm, dry and intact.  Psychiatric: He has a normal mood and affect. His speech is normal and behavior is normal.  Nursing note and vitals reviewed.    Assessment and Plan:    Problem List Items Addressed This Visit      Cardiovascular and Mediastinum   Essential hypertension    Blood pressure is elevated. He endorses white coat syndrome. I recommended that he follow-up with [place] in 2 weeks, keep a blood pressure log. Avoid salty foods. Consider resuming HCTZ 25 mg. He is going to bring in his cuff at next visit to make sure it is accurate. Asymptomatic. No red flags on exam. BP Readings from Last 3 Encounters:  11/24/17 (!) 150/96  11/20/17 135/83  08/19/17 (!) 138/92         Relevant Orders   CBC  with Differential/Platelet   Comprehensive metabolic panel   Hemangioma of liver    Incidental finding on 11/18/2017 hospital CT. With recommendation for follow-up in 6-12 months. I will defer further following of this to PCP.      Relevant Orders   CBC with Differential/Platelet   Comprehensive metabolic panel     Digestive   SBO (small bowel obstruction) ([place])    Currently doing very well. He has advanced his diet to regular diet and was tolerating his regular intake. Any recurrence of symptoms he knows that he needs to go to the emergency room.      Relevant Orders   CBC with Differential/Platelet   Comprehensive metabolic panel    Other Visit Diagnoses    Hospital discharge follow-up    -  Primary   Relevant Orders   CBC with Differential/Platelet   Comprehensive metabolic panel       . Reviewed expectations re: course of current medical issues. . Discussed self-management of symptoms. . Outlined signs and symptoms indicating need for more acute intervention. . Patient verbalized understanding and all questions were answered. . See orders for this visit as documented in the electronic medical record. . Patient received an After Visit Summary.  CMA or LPN served as scribe during this visit. History, Physical, and Plan performed by medical provider. Documentation and orders reviewed and attested to.  Inda Coke, PA-C [place], [place] 11/24/2017  Follow-up: No Follow-up on file.

## 2017-11-24 NOTE — Patient Instructions (Addendum)
It was great to meet you!  We will call you with your lab results.  Keep an eye on blood pressure - if consistently >140/90, please let me know. Keep a record of your blood pressures for [place] and follow-up in 2 weeks so we can make sure you are getting better readings. Avoid high salt foods.

## 2017-11-24 NOTE — Assessment & Plan Note (Signed)
Currently doing very well. He has advanced his diet to regular diet and was tolerating his regular intake. Any recurrence of symptoms he knows that he needs to go to the emergency room.

## 2017-12-07 ENCOUNTER — Ambulatory Visit: Payer: 59 | Admitting: Physician Assistant

## 2017-12-11 ENCOUNTER — Encounter: Payer: Self-pay | Admitting: Physician Assistant

## 2017-12-11 ENCOUNTER — Ambulatory Visit: Payer: 59 | Admitting: Physician Assistant

## 2017-12-11 VITALS — BP 162/100 | HR 85 | Temp 97.9°F | Ht 71.0 in | Wt 194.0 lb

## 2017-12-11 DIAGNOSIS — I10 Essential (primary) hypertension: Secondary | ICD-10-CM | POA: Diagnosis not present

## 2017-12-11 MED ORDER — AMLODIPINE BESYLATE 2.5 MG PO TABS: 2.5000 mg | ORAL_TABLET | Freq: Every day | ORAL | 1 refills | Status: DC

## 2017-12-11 NOTE — Assessment & Plan Note (Addendum)
BP Readings from Last 3 Encounters:  12/11/17 (!) 162/100  11/24/17 (!) 150/96  11/20/17 135/83   Remains uncontrolled, however readings from home are extremely variable, but they are mostly <140/90 (but greater than 130/80). He states that his back pain often causes his blood pressure to increase and his back pain has been worsening since he last saw me, he is anticipating needing another surgery soon for this. He was going to bring his blood pressure cuff in today but he forgot. Recommend bringing it back for [place] to assess at next visit or with Dr. Ansel Bong appointment in 2 months. Had a long discussion about risks/benefits of starting medication. He is very reluctant to start HCTZ due to diuretic component. Will start low dose norvasc 2.5 mg and follow-up in 1 month, sooner if concerns arise.

## 2017-12-11 NOTE — Progress Notes (Signed)
Bradley [name] is a 52 y.o. male is here to discuss: Hypertension  I acted as a Education administrator for [employer], PA-C Anselmo Pickler, LPN  History of Present Illness:   Chief Complaint  Patient presents with  . Hypertension    Hypertension  This is a chronic problem. Episode onset: Pt here follow up on blood pressure past 2 weeks has been running  in AM 115/82 to 151/96, PM  130/80 to 153/91. The problem has been waxing and waning since onset. The problem is uncontrolled. Pertinent negatives include no blurred vision, chest pain, headaches, neck pain, palpitations, peripheral edema or shortness of breath. Risk factors for coronary artery disease include dyslipidemia and male gender.   Wt Readings from Last 5 Encounters:  12/11/17 194 lb (88 kg)  11/24/17 195 lb (88.5 kg)  11/20/17 196 lb 10.9 oz (89.2 kg)  08/19/17 195 lb 3.2 oz (88.5 kg)  12/04/16 195 lb 3.2 oz (88.5 kg)   Admits that he never started the HCTZ 25mg  that Dr. Yong Channel prescribed several months ago. A friend of his had a stroke and now he is wondering if he needs medication. We discussed how this medication works and he states that since he left the hospital he has stopped drinking non-water beverages and has increased his water intake significantly; he feels that he is "peeing all the time now."  There are no preventive care reminders to display for this patient.  Past Medical History:  Diagnosis Date  . Allergy   . Blood transfusion without reported diagnosis   . Cough variant asthma 10/12/2007   no per pt 06-19-16  . Elevated LFTs   . Esophageal reflux   . GERD (gastroesophageal reflux disease)   . Hiatal hernia   . Hyperlipidemia   . Irritable bowel syndrome   . LATERAL EPICONDYLITIS 03/07/2010   Qualifier: Diagnosis of  By: Elease Hashimoto MD, Bruce  left shoulder arthrits per pt 06-19-16  . Liver lesion   . Lung mass   . Personal history of colonic polyps 09/28/2009    ADENOMATOUS POLYP  . PONV (postoperative nausea and  vomiting)   . Schatzki's ring   . Vitamin B 12 deficiency      Social History   Socioeconomic History  . Marital status: Married    Spouse name: Not on file  . Number of children: 2  . Years of education: Not on file  . Highest education level: Not on file  Occupational History  . Occupation: ELECTRICIAN    Employer: [employer]  . Financial resource strain: Not on file  . Food insecurity:    Worry: Not on file    Inability: Not on file  . Transportation needs:    Medical: Not on file    Non-medical: Not on file  Tobacco Use  . Smoking status: Never Smoker  . Smokeless tobacco: Never Used  Substance and Sexual Activity  . Alcohol use: Yes    Comment: occ.  . Drug use: No  . Sexual activity: Not on file  Lifestyle  . Physical activity:    Days per week: Not on file    Minutes per session: Not on file  . Stress: Not on file  Relationships  . Social connections:    Talks on phone: Not on file    Gets together: Not on file    Attends religious service: Not on file    Active member of club or organization: Not on file  Attends meetings of clubs or organizations: Not on file    Relationship status: Not on file  . Intimate partner violence:    Fear of current or ex partner: Not on file    Emotionally abused: Not on file    Physically abused: Not on file    Forced sexual activity: Not on file  Other Topics Concern  . Not on file  Social History Narrative   Married, lives with wife and son   OCCUPATION: Physiological scientist     Past Surgical History:  Procedure Laterality Date  . COLONOSCOPY    . Exploration Laparotomy and Small Bowel resection    . LEG SURGERY     left  . LUMBAR LAMINECTOMY     x3 last one June 2017  . UPPER GASTROINTESTINAL ENDOSCOPY    . VENTRAL HERNIA REPAIR    . VIDEO BRONCHOSCOPY Bilateral 06/10/2016   Procedure: VIDEO BRONCHOSCOPY WITH FLUORO;  Surgeon: Marshell Garfinkel, MD;  Location: [place];  Service: Cardiopulmonary;   Laterality: Bilateral;  . VIDEO BRONCHOSCOPY WITH ENDOBRONCHIAL ULTRASOUND N/A 09/08/2016   Procedure: VIDEO BRONCHOSCOPY WITH ENDOBRONCHIAL ULTRASOUND;  Surgeon: Marshell Garfinkel, MD;  Location: [facility];  Service: Pulmonary;  Laterality: N/A;    Family History  Problem Relation Age of Onset  . Breast cancer Mother   . Thyroid cancer Mother   . Colon cancer Neg Hx   . Esophageal cancer Neg Hx   . Rectal cancer Neg Hx   . Stomach cancer Neg Hx     PMHx, SurgHx, SocialHx, FamHx, Medications, and Allergies were reviewed in the Visit Navigator and updated as appropriate.   Patient Active Problem List   Diagnosis Date Noted  . Hemangioma of liver 11/24/2017  . Small bowel obstruction ([place]) 11/18/2017  . Pulmonary nodules 06/03/2016  . SBO (small bowel obstruction) ([place]) 04/11/2016  . Hypokalemia 04/11/2016  . Total bilirubin, elevated 04/11/2016  . Insomnia 05/10/2015  . Gallstones 05/10/2015  . Essential hypertension 05/10/2015  . History of colonic polyps 10/03/2010  . B12 deficiency 09/05/2009  . IBS 08/31/2009  . ALLERGIC RHINITIS, SEASONAL 07/24/2009  . External hemorrhoids 06/07/2008  . Hyperlipidemia 05/12/2007  . GERD 05/12/2007    Social History   Tobacco Use  . Smoking status: Never Smoker  . Smokeless tobacco: Never Used  Substance Use Topics  . Alcohol use: Yes    Comment: occ.  . Drug use: No    Current Medications and Allergies:    Current Outpatient Medications:  .  atorvastatin (LIPITOR) 10 MG tablet, Take 1 tablet (10 mg total) by mouth daily., Disp: 90 tablet, Rfl: 3 .  clonazePAM (KLONOPIN) 0.5 MG tablet, TAKE 1 TABLET AT BEDTIME AS NEEDED FOR SLEEP, Disp: 30 tablet, Rfl: 2 .  cyanocobalamin (,VITAMIN B-12,) 1000 MCG/ML injection, Inject 1 mL (1,000 mcg total) into the skin every 30 (thirty) days., Disp: 1 mL, Rfl: 5 .  ibuprofen (ADVIL,MOTRIN) 200 MG tablet, Take 400 mg by mouth 2 (two) times daily as needed (BACK PAIN)., Disp: , Rfl:  .  omeprazole  (PRILOSEC) 40 MG capsule, Take 1 capsule (40 mg total) by mouth daily., Disp: 90 capsule, Rfl: 3 .  amLODipine (NORVASC) 2.5 MG tablet, Take 1 tablet (2.5 mg total) by mouth daily., Disp: 30 tablet, Rfl: 1 .  azelastine (ASTELIN) 0.1 % nasal spray, Place 2 sprays into both nostrils 2 (two) times daily. Use in each nostril as directed (Patient not taking: Reported on 12/11/2017), Disp: 30 mL, Rfl: 3  Allergies  Allergen Reactions  . No Known Allergies     Review of Systems   Review of Systems  Eyes: Negative for blurred vision.  Respiratory: Negative for shortness of breath.   Cardiovascular: Negative for chest pain and palpitations.  Musculoskeletal: Negative for neck pain.  Neurological: Negative for headaches.    Vitals:   Vitals:   12/11/17 0932  BP: (!) 162/100  Pulse: 85  Temp: 97.9 F (36.6 C)  TempSrc: Oral  SpO2: 98%  Weight: 194 lb (88 kg)  Height: 5\' 11"  (1.803 m)     Body mass index is 27.06 kg/m.   Physical Exam:    Physical Exam  Constitutional: He appears well-developed. He is cooperative.  Non-toxic appearance. He does not have a sickly appearance. He does not appear ill. No distress.  Cardiovascular: Normal rate, regular rhythm, S1 normal, S2 normal, normal heart sounds and normal pulses.  No LE edema  Pulmonary/Chest: Effort normal and breath sounds normal.  Neurological: He is alert. GCS eye subscore is 4. GCS verbal subscore is 5. GCS motor subscore is 6.  Skin: Skin is warm, dry and intact.  Psychiatric: He has a normal mood and affect. His speech is normal and behavior is normal.  Nursing note and vitals reviewed.    Assessment and Plan:    Problem List Items Addressed This Visit      Cardiovascular and Mediastinum   Essential hypertension - Primary    BP Readings from Last 3 Encounters:  12/11/17 (!) 162/100  11/24/17 (!) 150/96  11/20/17 135/83   Remains uncontrolled, however readings from home are extremely variable, but they are  mostly <140/90 (but greater than 130/80). He states that his back pain often causes his blood pressure to increase and his back pain has been worsening since he last saw me, he is anticipating needing another surgery soon for this. He was going to bring his blood pressure cuff in today but he forgot. Recommend bringing it back for [place] to assess at next visit or with Dr. Ansel Bong appointment in 2 months. Had a long discussion about risks/benefits of starting medication. He is very reluctant to start HCTZ due to diuretic component. Will start low dose norvasc 2.5 mg and follow-up in 1 month, sooner if concerns arise.      Relevant Medications   amLODipine (NORVASC) 2.5 MG tablet       . Reviewed expectations re: course of current medical issues. . Discussed self-management of symptoms. . Outlined signs and symptoms indicating need for more acute intervention. . Patient verbalized understanding and all questions were answered. . See orders for this visit as documented in the electronic medical record. . Patient received an After Visit Summary.  CMA or LPN served as scribe during this visit. History, Physical, and Plan performed by medical provider. Documentation and orders reviewed and attested to.  Inda Coke, PA-C [place] [place] 12/11/2017  Follow-up: Return in about 1 month (around 01/11/2018) for BP follow-up with Dr. Yong Channel or Aldona Bar.

## 2017-12-11 NOTE — Patient Instructions (Addendum)
It was great to see you.   Start 2.5 mg Amlodopine.  Keep an eye on blood pressure - if consistently >140/90, please let me know. Please bring blood pressure log and cuff with you for [place] to review at next visit.  Follow-up in 1 month.

## 2017-12-29 ENCOUNTER — Inpatient Hospital Stay: Payer: 59 | Admitting: Physician Assistant

## 2018-01-15 ENCOUNTER — Ambulatory Visit: Payer: 59 | Admitting: Physician Assistant

## 2018-01-21 ENCOUNTER — Encounter (INDEPENDENT_AMBULATORY_CARE_PROVIDER_SITE_OTHER): Payer: Self-pay

## 2018-02-05 ENCOUNTER — Other Ambulatory Visit: Payer: Self-pay | Admitting: Physician Assistant

## 2018-02-05 NOTE — Telephone Encounter (Signed)
Dr. Ansel Bong pt.

## 2018-02-17 ENCOUNTER — Ambulatory Visit: Payer: 59 | Admitting: Family Medicine

## 2018-03-18 ENCOUNTER — Encounter: Payer: Self-pay | Admitting: Family Medicine

## 2018-03-26 ENCOUNTER — Ambulatory Visit: Payer: 59 | Admitting: Physician Assistant

## 2018-03-26 ENCOUNTER — Encounter: Payer: Self-pay | Admitting: Physician Assistant

## 2018-03-26 VITALS — BP 146/90 | HR 75 | Temp 98.1°F | Ht 71.0 in | Wt 198.0 lb

## 2018-03-26 DIAGNOSIS — L03311 Cellulitis of abdominal wall: Secondary | ICD-10-CM | POA: Diagnosis not present

## 2018-03-26 DIAGNOSIS — W57XXXA Bitten or stung by nonvenomous insect and other nonvenomous arthropods, initial encounter: Secondary | ICD-10-CM

## 2018-03-26 DIAGNOSIS — S30861A Insect bite (nonvenomous) of abdominal wall, initial encounter: Secondary | ICD-10-CM

## 2018-03-26 MED ORDER — DOXYCYCLINE HYCLATE 100 MG PO TABS: 100.0000 mg | ORAL_TABLET | Freq: Two times a day (BID) | ORAL | 0 refills | Status: DC

## 2018-03-26 NOTE — Patient Instructions (Signed)

## 2018-03-26 NOTE — Progress Notes (Signed)
Bradley [name] is a 52 y.o. male here for a new problem.  I acted as a Education administrator for [employer], PA-C Anselmo Pickler, LPN  History of Present Illness:   Chief Complaint  Patient presents with  . Insect Bite    Tick    HPI  Tick bite Pt here to have tick bite from 3 weeks ago assessed located mid epigastric area. Pt removed tick 3 weeks ago but the head was still in and then a week ago the head fell out. Mid epigastric area has a red rash now, was itching but has resolved. Denies fever, chills, headaches, joint pain, fatigue. States that his wife is concerned because their son has a history of lyme disease. He hasn't used anything to treat this tick bite.  Past Medical History:  Diagnosis Date  . Allergy   . Blood transfusion without reported diagnosis   . Cough variant asthma 10/12/2007   no per pt 06-19-16  . Elevated LFTs   . Esophageal reflux   . GERD (gastroesophageal reflux disease)   . Hiatal hernia   . Hyperlipidemia   . Irritable bowel syndrome   . LATERAL EPICONDYLITIS 03/07/2010   Qualifier: Diagnosis of  By: Elease Hashimoto MD, Bruce  left shoulder arthrits per pt 06-19-16  . Liver lesion   . Lung mass   . Personal history of colonic polyps 09/28/2009    ADENOMATOUS POLYP  . PONV (postoperative nausea and vomiting)   . Schatzki's ring   . Vitamin B 12 deficiency      Social History   Socioeconomic History  . Marital status: Married    Spouse name: Not on file  . Number of children: 2  . Years of education: Not on file  . Highest education level: Not on file  Occupational History  . Occupation: ELECTRICIAN    Employer: [employer]  . Financial resource strain: Not on file  . Food insecurity:    Worry: Not on file    Inability: Not on file  . Transportation needs:    Medical: Not on file    Non-medical: Not on file  Tobacco Use  . Smoking status: Never Smoker  . Smokeless tobacco: Never Used  Substance and Sexual Activity  . Alcohol use: Yes   Comment: occ.  . Drug use: No  . Sexual activity: Not on file  Lifestyle  . Physical activity:    Days per week: Not on file    Minutes per session: Not on file  . Stress: Not on file  Relationships  . Social connections:    Talks on phone: Not on file    Gets together: Not on file    Attends religious service: Not on file    Active member of club or organization: Not on file    Attends meetings of clubs or organizations: Not on file    Relationship status: Not on file  . Intimate partner violence:    Fear of current or ex partner: Not on file    Emotionally abused: Not on file    Physically abused: Not on file    Forced sexual activity: Not on file  Other Topics Concern  . Not on file  Social History Narrative   Married, lives with wife and son   OCCUPATION: Physiological scientist     Past Surgical History:  Procedure Laterality Date  . COLONOSCOPY    . Exploration Laparotomy and Small Bowel resection    . LEG SURGERY  left  . LUMBAR LAMINECTOMY     x3 last one June 2017  . UPPER GASTROINTESTINAL ENDOSCOPY    . VENTRAL HERNIA REPAIR    . VIDEO BRONCHOSCOPY Bilateral 06/10/2016   Procedure: VIDEO BRONCHOSCOPY WITH FLUORO;  Surgeon: Marshell Garfinkel, MD;  Location: [facility];  Service: Cardiopulmonary;  Laterality: Bilateral;  . VIDEO BRONCHOSCOPY WITH ENDOBRONCHIAL ULTRASOUND N/A 09/08/2016   Procedure: VIDEO BRONCHOSCOPY WITH ENDOBRONCHIAL ULTRASOUND;  Surgeon: Marshell Garfinkel, MD;  Location: [facility];  Service: Pulmonary;  Laterality: N/A;    Family History  Problem Relation Age of Onset  . Breast cancer Mother   . Thyroid cancer Mother   . Colon cancer Neg Hx   . Esophageal cancer Neg Hx   . Rectal cancer Neg Hx   . Stomach cancer Neg Hx     Allergies  Allergen Reactions  . No Known Allergies     Current Medications:   Current Outpatient Medications:  .  amLODipine (NORVASC) 2.5 MG tablet, TAKE 1 TABLET BY MOUTH EVERY DAY, Disp: 90 tablet, Rfl: 1 .   atorvastatin (LIPITOR) 10 MG tablet, Take 1 tablet (10 mg total) by mouth daily., Disp: 90 tablet, Rfl: 3 .  clonazePAM (KLONOPIN) 0.5 MG tablet, TAKE 1 TABLET AT BEDTIME AS NEEDED FOR SLEEP, Disp: 30 tablet, Rfl: 2 .  cyanocobalamin (,VITAMIN B-12,) 1000 MCG/ML injection, Inject 1 mL (1,000 mcg total) into the skin every 30 (thirty) days., Disp: 1 mL, Rfl: 5 .  gabapentin (NEURONTIN) 300 MG capsule, PLEASE SEE ATTACHED FOR DETAILED DIRECTIONS, Disp: , Rfl: 0 .  ibuprofen (ADVIL,MOTRIN) 200 MG tablet, Take 400 mg by mouth 2 (two) times daily as needed (BACK PAIN)., Disp: , Rfl:  .  omeprazole (PRILOSEC) 40 MG capsule, Take 1 capsule (40 mg total) by mouth daily., Disp: 90 capsule, Rfl: 3 .  PAZEO 0.7 % SOLN, , Disp: , Rfl:  .  azelastine (ASTELIN) 0.1 % nasal spray, Place 2 sprays into both nostrils 2 (two) times daily. Use in each nostril as directed (Patient not taking: Reported on 12/11/2017), Disp: 30 mL, Rfl: 3 .  doxycycline (VIBRA-TABS) 100 MG tablet, Take 1 tablet (100 mg total) by mouth 2 (two) times daily., Disp: 20 tablet, Rfl: 0   Review of Systems:   ROS  Negative unless otherwise specified per HPI.   Vitals:   Vitals:   03/26/18 1527  BP: (!) 146/90  Pulse: 75  Temp: 98.1 F (36.7 C)  TempSrc: Oral  SpO2: 96%  Weight: 198 lb (89.8 kg)  Height: 5\' 11"  (1.803 m)     Body mass index is 27.62 kg/m.  Physical Exam:   Physical Exam  Constitutional: He appears well-developed. He is cooperative.  Non-toxic appearance. He does not have a sickly appearance. He does not appear ill. No distress.  Cardiovascular: Normal rate, regular rhythm, S1 normal, S2 normal, normal heart sounds and normal pulses.  No LE edema  Pulmonary/Chest: Effort normal and breath sounds normal.  Neurological: He is alert. GCS eye subscore is 4. GCS verbal subscore is 5. GCS motor subscore is 6.  Skin: Skin is warm, dry and intact.  Erythematous well-circumscribed plaque to mid-chest between sternum  and umbilicus, approx 4 inches in diameter. No evidence of swelling, warmth, discharge. No target-appearing lesion or area of central clearing.  Psychiatric: He has a normal mood and affect. His speech is normal and behavior is normal.  Nursing note and vitals reviewed.   Assessment and Plan:    Bradley [name] was  seen today for insect bite.  Diagnoses and all orders for this visit:  Insect bite of abdominal wall, initial encounter  Other orders -     doxycycline (VIBRA-TABS) 100 MG tablet; Take 1 tablet (100 mg total) by mouth 2 (two) times daily.   No red flags on exam. I do suspect possible area of mild secondary skin infection to area where tick was -- recommended treating with oral doxycycline. Patient is agreeable to plan. Follow-up if symptoms worsen or persist.  . Reviewed expectations re: course of current medical issues. . Discussed self-management of symptoms. . Outlined signs and symptoms indicating need for more acute sdf. . Patient verbalized understanding and all questions were answered. . See orders for this visit as documented in the electronic medical record. . Patient received an After-Visit Summary.  CMA or LPN served as scribe during this visit. History, Physical, and Plan performed by medical provider. Documentation and orders reviewed and attested to.   Inda Coke, PA-C

## 2018-04-05 ENCOUNTER — Encounter: Payer: Self-pay | Admitting: Family Medicine

## 2018-05-25 ENCOUNTER — Other Ambulatory Visit: Payer: Self-pay

## 2018-05-25 MED ORDER — CLONAZEPAM 0.5 MG PO TABS: 0.5000 mg | ORAL_TABLET | Freq: Every evening | ORAL | 2 refills | Status: DC | PRN

## 2018-06-24 ENCOUNTER — Encounter: Payer: Self-pay | Admitting: Family Medicine

## 2018-06-24 ENCOUNTER — Ambulatory Visit (INDEPENDENT_AMBULATORY_CARE_PROVIDER_SITE_OTHER): Payer: 59 | Admitting: Family Medicine

## 2018-06-24 VITALS — BP 130/81 | HR 88 | Temp 97.8°F | Ht 71.0 in | Wt 197.0 lb

## 2018-06-24 DIAGNOSIS — E785 Hyperlipidemia, unspecified: Secondary | ICD-10-CM | POA: Diagnosis not present

## 2018-06-24 DIAGNOSIS — I10 Essential (primary) hypertension: Secondary | ICD-10-CM

## 2018-06-24 DIAGNOSIS — Z23 Encounter for immunization: Secondary | ICD-10-CM | POA: Diagnosis not present

## 2018-06-24 DIAGNOSIS — K219 Gastro-esophageal reflux disease without esophagitis: Secondary | ICD-10-CM

## 2018-06-24 DIAGNOSIS — Z0000 Encounter for general adult medical examination without abnormal findings: Secondary | ICD-10-CM

## 2018-06-24 DIAGNOSIS — J301 Allergic rhinitis due to pollen: Secondary | ICD-10-CM

## 2018-06-24 DIAGNOSIS — Z125 Encounter for screening for malignant neoplasm of prostate: Secondary | ICD-10-CM | POA: Diagnosis not present

## 2018-06-24 DIAGNOSIS — G4700 Insomnia, unspecified: Secondary | ICD-10-CM

## 2018-06-24 DIAGNOSIS — D1803 Hemangioma of intra-abdominal structures: Secondary | ICD-10-CM

## 2018-06-24 NOTE — Assessment & Plan Note (Signed)
HTN- controlled on amlodipine 2.5mg 

## 2018-06-24 NOTE — Assessment & Plan Note (Signed)
GERD- omeprazole 40mg  effective, lower doses he has breakthrough even tried this recently. Has failed zantac or pepcid.

## 2018-06-24 NOTE — Patient Instructions (Signed)
Health Maintenance Due  Topic Date Due  . INFLUENZA VACCINE - today 04/22/2018   Schedule a lab visit at the check out desk within 2 weeks. Return for future fasting labs meaning nothing but water after midnight please. Ok to take your medications with water.

## 2018-06-24 NOTE — Assessment & Plan Note (Signed)
Allergies- OTC flonase mild help. astelin- bad taste. May use chlorpheniramine if needed again in future

## 2018-06-24 NOTE — Progress Notes (Signed)
Phone: [phone]  Subjective:  Patient presents today for their annual physical. Chief complaint-noted.   See problem oriented charting- ROS- full  review of systems was completed and negative except for: Some seasonal allergies.  No chest pain or shortness of breath reported  The following were reviewed and entered/updated in epic: Past Medical History:  Diagnosis Date  . Allergy   . Blood transfusion without reported diagnosis   . Cough variant asthma 10/12/2007   no per pt 06-19-16  . Elevated LFTs   . Esophageal reflux   . GERD (gastroesophageal reflux disease)   . Hiatal hernia   . Hyperlipidemia   . Irritable bowel syndrome   . LATERAL EPICONDYLITIS 03/07/2010   Qualifier: Diagnosis of  By: Elease Hashimoto MD, Bruce  left shoulder arthrits per pt 06-19-16  . Liver lesion   . Lung mass   . Personal history of colonic polyps 09/28/2009    ADENOMATOUS POLYP  . PONV (postoperative nausea and vomiting)   . Schatzki's ring   . Vitamin B 12 deficiency    Patient Active Problem List   Diagnosis Date Noted  . Insomnia 05/10/2015    Priority: Medium  . Essential hypertension 05/10/2015    Priority: Medium  . Hyperlipidemia 05/12/2007    Priority: Medium  . GERD 05/12/2007    Priority: Medium  . Hemangioma of liver 11/24/2017    Priority: Low  . Gallstones 05/10/2015    Priority: Low  . History of colonic polyps 10/03/2010    Priority: Low  . B12 deficiency 09/05/2009    Priority: Low  . IBS 08/31/2009    Priority: Low  . ALLERGIC RHINITIS, SEASONAL 07/24/2009    Priority: Low  . External hemorrhoids 06/07/2008    Priority: Low  . Small bowel obstruction ([place]) 11/18/2017  . Pulmonary nodules 06/03/2016  . SBO (small bowel obstruction) ([place]) 04/11/2016  . Hypokalemia 04/11/2016  . Total bilirubin, elevated 04/11/2016   Past Surgical History:  Procedure Laterality Date  . COLONOSCOPY    . Exploration Laparotomy and Small Bowel resection    . LEG SURGERY     left    . LUMBAR LAMINECTOMY     x3 last one June 2017  . UPPER GASTROINTESTINAL ENDOSCOPY    . VENTRAL HERNIA REPAIR    . VIDEO BRONCHOSCOPY Bilateral 06/10/2016   Procedure: VIDEO BRONCHOSCOPY WITH FLUORO;  Surgeon: Marshell Garfinkel, MD;  Location: [facility];  Service: Cardiopulmonary;  Laterality: Bilateral;  . VIDEO BRONCHOSCOPY WITH ENDOBRONCHIAL ULTRASOUND N/A 09/08/2016   Procedure: VIDEO BRONCHOSCOPY WITH ENDOBRONCHIAL ULTRASOUND;  Surgeon: Marshell Garfinkel, MD;  Location: [facility];  Service: Pulmonary;  Laterality: N/A;    Family History  Problem Relation Age of Onset  . Breast cancer Mother   . Thyroid cancer Mother   . Colon cancer Neg Hx   . Esophageal cancer Neg Hx   . Rectal cancer Neg Hx   . Stomach cancer Neg Hx     Medications- reviewed and updated Current Outpatient Medications  Medication Sig Dispense Refill  . amLODipine (NORVASC) 2.5 MG tablet TAKE 1 TABLET BY MOUTH EVERY DAY 90 tablet 1  . atorvastatin (LIPITOR) 10 MG tablet Take 1 tablet (10 mg total) by mouth daily. 90 tablet 3  . clonazePAM (KLONOPIN) 0.5 MG tablet Take 1 tablet (0.5 mg total) by mouth at bedtime as needed. for sleep 30 tablet 2  . cyanocobalamin (,VITAMIN B-12,) 1000 MCG/ML injection Inject 1 mL (1,000 mcg total) into the skin every 30 (thirty) days. 1  mL 5  . gabapentin (NEURONTIN) 300 MG capsule PLEASE SEE ATTACHED FOR DETAILED DIRECTIONS  0  . omeprazole (PRILOSEC) 40 MG capsule Take 1 capsule (40 mg total) by mouth daily. 90 capsule 3  . PAZEO 0.7 % SOLN      No current facility-administered medications for this visit.     Allergies-reviewed and updated Allergies  Allergen Reactions  . No Known Allergies     Social History   Social History Narrative   Married, lives with wife and son   OCCUPATION: Physiological scientist     Objective: BP 130/81 (BP Location: Right Arm, Patient Position: Sitting, Cuff Size: Large)   Pulse 88   Temp 97.8 F (36.6 C) (Oral)   Ht 5\' 11"  (1.803 m)   Wt  197 lb (89.4 kg)   SpO2 97%   BMI 27.48 kg/m  Gen: NAD, resting comfortably, occasional mild cough HEENT: Mucous membranes are moist. Oropharynx normal. Tm normal  Neck: no thyromegaly CV: RRR no murmurs rubs or gallops Lungs: CTAB no crackles, wheeze, rhonchi Abdomen: soft/nontender/nondistended/normal bowel sounds. No rebound or guarding.  Ext: no edema Skin: warm, dry Neuro: grossly normal, moves all extremities, PERRLA   Assessment/Plan:  52 y.o. male presenting for annual physical.  Health Maintenance counseling: 1. Anticipatory guidance: Patient counseled regarding regular dental exams -q6 months, eye exams -yearly, wearing seatbelts.  2. Risk factor reduction:  Advised patient of need for regular exercise and diet rich and fruits and vegetables to reduce risk of heart attack and stroke. Exercise- walks with wife daily 2 miles. Diet-admits to some indiscretions discussed cleaning this up some. He does most of cooking but only knows the unhealthy stuff.   Weight up 2 pounds from last year Wt Readings from Last 3 Encounters:  06/24/18 197 lb (89.4 kg)  03/26/18 198 lb (89.8 kg)  12/11/17 194 lb (88 kg)  3. Immunizations/screenings/ancillary studies-flu shot today.  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 Influenza Split 07/14/2011  . Influenza,inj,Quad PF,6+ Mos 06/27/2013, 07/30/2015, 06/17/2016, 08/19/2017  . Td 06/23/1999, 08/25/2008  . Tdap 06/21/2016  4. Prostate cancer screening- he has requested early screening, friend with prostate cancer.  Will get PSA alone. Lab Results  Component Value Date   PSA 1.23 08/19/2017   5. Colon cancer screening - 07/03/16- 5 adenoma and 3 year repeat planned  6. Skin cancer screening- dermatologist about 3 years ago. advised regular sunscreen use. Denies worrisome, changing, or new skin lesions.   Status of chronic or acute  concerns   Pulmonary nodules- prior noted on CT- on 04/01/17 CT was improved with resolution of mediastinal lymph nodes. Micronodules also had resolved. Had a perifissural nodule that was smaller than prior- no follow up was recommended. Has not seen pulmonary for sarcoidosis- no recent breathing issues.   Essential hypertension HTN- controlled on amlodipine 2.5mg   Hyperlipidemia HLD- poor control on atorvastatin 10mg  daily now- had been on 4x a week. Will  update lipids. Patient was inspired by his aortic atherosclerosis noted on CT scan to take his statin daily.  GERD GERD- omeprazole 40mg  effective, lower doses he has breakthrough even tried this recently. Has failed zantac or pepcid.   ALLERGIC RHINITIS, SEASONAL Allergies- OTC flonase mild help. astelin- bad taste. May use chlorpheniramine if needed again in future  Insomnia Since being on gabapentin has not needed klonopin nearly as much. He uses it sparingly  if skips gabapentin for a night.   Hemangioma of liver Hemangiomas on Ct abd/pelvis in February (27th) - no regular follow up recommended due to stability.    Future Appointments  Date Time Provider [place]  06/25/2018  9:30 AM [facility]   Return in about 6 months (around 12/24/2018) for follow up- or sooner if needed.  Lab/Order associations: Preventative health care - Plan: CBC, Comprehensive metabolic panel, Lipid panel, PSA  Need for prophylactic vaccination and inoculation against influenza - Plan: Flu Vaccine QUAD 36+ mos IM  Hyperlipidemia, unspecified hyperlipidemia type - Plan: CBC, Comprehensive metabolic panel, Lipid panel  Screening for prostate cancer - Plan: PSA  Essential hypertension  Gastroesophageal reflux disease without esophagitis  Allergic rhinitis due to pollen, unspecified seasonality  Insomnia, unspecified type  Hemangioma of liver  Return precautions advised.  Garret Reddish, MD

## 2018-06-24 NOTE — Assessment & Plan Note (Signed)
HLD- poor control on atorvastatin 10mg  daily now- had been on 4x a week. Will  update lipids. Patient was inspired by his aortic atherosclerosis noted on CT scan to take his statin daily.

## 2018-06-24 NOTE — Assessment & Plan Note (Signed)
Since being on gabapentin has not needed klonopin nearly as much. He uses it sparingly if skips gabapentin for a night.

## 2018-06-24 NOTE — Assessment & Plan Note (Signed)
Hemangiomas on Ct abd/pelvis in February (27th) - no regular follow up recommended due to stability.

## 2018-06-25 ENCOUNTER — Other Ambulatory Visit (INDEPENDENT_AMBULATORY_CARE_PROVIDER_SITE_OTHER): Payer: 59

## 2018-06-25 DIAGNOSIS — Z0000 Encounter for general adult medical examination without abnormal findings: Secondary | ICD-10-CM | POA: Diagnosis not present

## 2018-06-25 DIAGNOSIS — Z125 Encounter for screening for malignant neoplasm of prostate: Secondary | ICD-10-CM | POA: Diagnosis not present

## 2018-06-25 DIAGNOSIS — E785 Hyperlipidemia, unspecified: Secondary | ICD-10-CM | POA: Diagnosis not present

## 2018-06-25 LAB — COMPREHENSIVE METABOLIC PANEL
ALBUMIN: 4.4 g/dL (ref 3.5–5.2)
ALK PHOS: 85 U/L (ref 39–117)
ALT: 43 U/L (ref 0–53)
AST: 27 U/L (ref 0–37)
BILIRUBIN TOTAL: 0.8 mg/dL (ref 0.2–1.2)
BUN: 19 mg/dL (ref 6–23)
CO2: 31 mEq/L (ref 19–32)
CREATININE: 0.98 mg/dL (ref 0.40–1.50)
Calcium: 9.4 mg/dL (ref 8.4–10.5)
Chloride: 104 mEq/L (ref 96–112)
GFR: 84.76 mL/min (ref >60.00)
Glucose, Bld: 96 mg/dL (ref 70–99)
POTASSIUM: 4 meq/L (ref 3.5–5.1)
SODIUM: 141 meq/L (ref 135–145)
TOTAL PROTEIN: 6.5 g/dL (ref 6.0–8.3)

## 2018-06-25 LAB — CBC
HCT: 43.7 % (ref 39.0–52.0)
Hemoglobin: 15 g/dL (ref 13.0–17.0)
MCHC: 34.2 g/dL (ref 30.0–36.0)
MCV: 90.3 fl (ref 78.0–100.0)
PLATELETS: 215 10*3/uL (ref 150.0–400.0)
RBC: 4.84 Mil/uL (ref 4.22–5.81)
RDW: 12.8 % (ref 11.5–15.5)
WBC: 5.9 10*3/uL (ref 4.0–10.5)

## 2018-06-25 LAB — LIPID PANEL
CHOLESTEROL: 171 mg/dL (ref 0–200)
HDL: 39.7 mg/dL (ref >39.00)
LDL Cholesterol: 101 mg/dL — ABNORMAL HIGH (ref 0–99)
NonHDL: 130.99
Total CHOL/HDL Ratio: 4
Triglycerides: 151 mg/dL — ABNORMAL HIGH (ref 0.0–149.0)
VLDL: 30.2 mg/dL (ref 0.0–40.0)

## 2018-06-25 LAB — PSA: PSA: 1.38 ng/mL (ref 0.10–4.00)

## 2018-08-03 ENCOUNTER — Other Ambulatory Visit: Payer: Self-pay

## 2018-08-03 ENCOUNTER — Other Ambulatory Visit: Payer: Self-pay | Admitting: Physician Assistant

## 2018-08-03 MED ORDER — ATORVASTATIN CALCIUM 10 MG PO TABS: 10.0000 mg | ORAL_TABLET | Freq: Every day | ORAL | 3 refills | Status: DC

## 2018-08-06 ENCOUNTER — Other Ambulatory Visit: Payer: Self-pay | Admitting: *Deleted

## 2018-08-06 MED ORDER — CYANOCOBALAMIN 1000 MCG/ML IJ SOLN: 1000.0000 ug | INTRAMUSCULAR | 5 refills | Status: DC

## 2018-08-31 ENCOUNTER — Other Ambulatory Visit: Payer: Self-pay

## 2018-08-31 MED ORDER — OMEPRAZOLE 40 MG PO CPDR: 40.0000 mg | DELAYED_RELEASE_CAPSULE | Freq: Every day | ORAL | 3 refills | Status: DC

## 2018-09-06 ENCOUNTER — Ambulatory Visit: Payer: 59 | Admitting: Physician Assistant

## 2018-09-06 ENCOUNTER — Encounter: Payer: Self-pay | Admitting: Physician Assistant

## 2018-09-06 VITALS — BP 140/80 | HR 78 | Temp 97.9°F | Ht 71.0 in | Wt 201.0 lb

## 2018-09-06 DIAGNOSIS — K219 Gastro-esophageal reflux disease without esophagitis: Secondary | ICD-10-CM

## 2018-09-06 MED ORDER — PANTOPRAZOLE SODIUM 20 MG PO TBEC: 20.0000 mg | DELAYED_RELEASE_TABLET | Freq: Every day | ORAL | 1 refills | Status: DC

## 2018-09-06 NOTE — Patient Instructions (Signed)
Stop prilosec.  Start daily protonix, 20 mg daily. If un-controlled may increase to 40 mg daily.  Follow-up if symptoms persist despite treatment.   Gastroesophageal Reflux Disease, Adult Normally, food travels down the esophagus and stays in the stomach to be digested. If a person has gastroesophageal reflux disease (GERD), food and stomach acid move back up into the esophagus. When this happens, the esophagus becomes sore and swollen (inflamed). Over time, GERD can make small holes (ulcers) in the lining of the esophagus. Follow these instructions at home: Diet  Follow a diet as told by your doctor. You may need to avoid foods and drinks such as: ? Coffee and tea (with or without caffeine). ? Drinks that contain alcohol. ? Energy drinks and sports drinks. ? Carbonated drinks or sodas. ? Chocolate and cocoa. ? Peppermint and mint flavorings. ? Garlic and onions. ? Horseradish. ? Spicy and acidic foods, such as peppers, chili powder, curry powder, vinegar, hot sauces, and BBQ sauce. ? Citrus fruit juices and citrus fruits, such as oranges, lemons, and limes. ? Tomato-based foods, such as red sauce, chili, salsa, and pizza with red sauce. ? Fried and fatty foods, such as donuts, french fries, potato chips, and high-fat dressings. ? High-fat meats, such as hot dogs, rib eye steak, sausage, ham, and bacon. ? High-fat dairy items, such as whole milk, butter, and cream cheese.  Eat small meals often. Avoid eating large meals.  Avoid drinking large amounts of liquid with your meals.  Avoid eating meals during the 2-3 hours before bedtime.  Avoid lying down right after you eat.  Do not exercise right after you eat. General instructions  Pay attention to any changes in your symptoms.  Take over-the-counter and prescription medicines only as told by your doctor. Do not take aspirin, ibuprofen, or other NSAIDs unless your doctor says it is okay.  Do not use any tobacco products,  including cigarettes, chewing tobacco, and e-cigarettes. If you need help quitting, ask your doctor.  Wear loose clothes. Do not wear anything tight around your waist.  Raise (elevate) the head of your bed about 6 inches (15 cm).  Try to lower your stress. If you need help doing this, ask your doctor.  If you are overweight, lose an amount of weight that is healthy for you. Ask your doctor about a safe weight loss goal.  Keep all follow-up visits as told by your doctor. This is important. Contact a doctor if:  You have new symptoms.  You lose weight and you do not know why it is happening.  You have trouble swallowing, or it hurts to swallow.  You have wheezing or a cough that keeps happening.  Your symptoms do not get better with treatment.  You have a hoarse voice. Get help right away if:  You have pain in your arms, neck, jaw, teeth, or back.  You feel sweaty, dizzy, or light-headed.  You have chest pain or shortness of breath.  You throw up (vomit) and your throw up looks like blood or coffee grounds.  You pass out (faint).  Your poop (stool) is bloody or black.  You cannot swallow, drink, or eat. This information is not intended to replace advice given to you by your health care provider. Make sure you discuss any questions you have with your health care provider. Document Released: 02/25/2008 Document Revised: 02/14/2016 Document Reviewed: 01/03/2015 Elsevier Interactive Patient Education  [employer].

## 2018-09-06 NOTE — Progress Notes (Signed)
Bradley [name] is a 52 y.o. male here for a existing problem.  I acted as a Education administrator for [employer], PA-C Anselmo Pickler, LPN  History of Present Illness:   Chief Complaint  Patient presents with  . Heartburn    Heartburn  He complains of heartburn. This is a recurrent problem. Episode onset: Started about a week ago. The problem occurs constantly. The problem has been unchanged. Heartburn duration: all day. Heartburn location: epigastric burning. The heartburn is of moderate intensity. The heartburn does not wake him from sleep. The heartburn does not limit his activity. The heartburn changes (feels better sitting up) with position. The symptoms are aggravated by lying down. He has tried an antacid and head elevation (Pt taking Omeprazole 40 mg and acid reflux OTC med) for the symptoms. The treatment provided mild relief. Pt has gallstones.   Patient has a long history of GERD, states he has been dealing with this for at least 10 years.  He is also also required esophageal dilation, about 10 years ago is where well.  He has been on Prilosec, Protonix, Dexilant.  He and his PCP decided to scale back and trial omeprazole to see if his symptoms could tolerate this instead of prescription medications.  Over the past week he has had worsening heartburn that is uncontrolled with his over-the-counter omeprazole.  He denies chest pain, shortness of breath, palpitations, radiation of pain, nausea.   Past Medical History:  Diagnosis Date  . Allergy   . Blood transfusion without reported diagnosis   . Cough variant asthma 10/12/2007   no per pt 06-19-16  . Elevated LFTs   . Esophageal reflux   . GERD (gastroesophageal reflux disease)   . Hiatal hernia   . Hyperlipidemia   . Irritable bowel syndrome   . LATERAL EPICONDYLITIS 03/07/2010   Qualifier: Diagnosis of  By: Elease Hashimoto MD, [place]  left shoulder arthrits per pt 06-19-16  . Liver lesion   . Lung mass   . Personal history of colonic polyps  09/28/2009    ADENOMATOUS POLYP  . PONV (postoperative nausea and vomiting)   . Schatzki's ring   . Vitamin B 12 deficiency      Social History   Socioeconomic History  . Marital status: Married    Spouse name: Not on file  . Number of children: 2  . Years of education: Not on file  . Highest education level: Not on file  Occupational History  . Occupation: ELECTRICIAN    Employer: [employer]  . Financial resource strain: Not on file  . Food insecurity:    Worry: Not on file    Inability: Not on file  . Transportation needs:    Medical: Not on file    Non-medical: Not on file  Tobacco Use  . Smoking status: Never Smoker  . Smokeless tobacco: Never Used  Substance and Sexual Activity  . Alcohol use: Yes    Comment: occ.  . Drug use: No  . Sexual activity: Not on file  Lifestyle  . Physical activity:    Days per week: Not on file    Minutes per session: Not on file  . Stress: Not on file  Relationships  . Social connections:    Talks on phone: Not on file    Gets together: Not on file    Attends religious service: Not on file    Active member of club or organization: Not on file    Attends meetings of  clubs or organizations: Not on file    Relationship status: Not on file  . Intimate partner violence:    Fear of current or ex partner: Not on file    Emotionally abused: Not on file    Physically abused: Not on file    Forced sexual activity: Not on file  Other Topics Concern  . Not on file  Social History Narrative   Married, lives with wife and son   OCCUPATION: Physiological scientist     Past Surgical History:  Procedure Laterality Date  . COLONOSCOPY    . Exploration Laparotomy and Small Bowel resection    . LEG SURGERY     left  . LUMBAR LAMINECTOMY     x3 last one June 2017  . UPPER GASTROINTESTINAL ENDOSCOPY    . VENTRAL HERNIA REPAIR    . VIDEO BRONCHOSCOPY Bilateral 06/10/2016   Procedure: VIDEO BRONCHOSCOPY WITH FLUORO;  Surgeon: Marshell Garfinkel, MD;  Location: [facility];  Service: Cardiopulmonary;  Laterality: Bilateral;  . VIDEO BRONCHOSCOPY WITH ENDOBRONCHIAL ULTRASOUND N/A 09/08/2016   Procedure: VIDEO BRONCHOSCOPY WITH ENDOBRONCHIAL ULTRASOUND;  Surgeon: Marshell Garfinkel, MD;  Location: [facility];  Service: Pulmonary;  Laterality: N/A;    Family History  Problem Relation Age of Onset  . Breast cancer Mother   . Thyroid cancer Mother   . Colon cancer Neg Hx   . Esophageal cancer Neg Hx   . Rectal cancer Neg Hx   . Stomach cancer Neg Hx     Allergies  Allergen Reactions  . No Known Allergies     Current Medications:   Current Outpatient Medications:  .  amLODipine (NORVASC) 2.5 MG tablet, TAKE 1 TABLET BY MOUTH EVERY DAY, Disp: 90 tablet, Rfl: 1 .  atorvastatin (LIPITOR) 10 MG tablet, Take 1 tablet (10 mg total) by mouth daily., Disp: 90 tablet, Rfl: 3 .  cyanocobalamin (,VITAMIN B-12,) 1000 MCG/ML injection, Inject 1 mL (1,000 mcg total) into the skin every 30 (thirty) days., Disp: 1 mL, Rfl: 5 .  gabapentin (NEURONTIN) 300 MG capsule, PLEASE SEE ATTACHED FOR DETAILED DIRECTIONS, Disp: , Rfl: 0 .  omeprazole (PRILOSEC) 40 MG capsule, Take 1 capsule (40 mg total) by mouth daily., Disp: 90 capsule, Rfl: 3 .  PAZEO 0.7 % SOLN, , Disp: , Rfl:  .  pantoprazole (PROTONIX) 20 MG tablet, Take 1 tablet (20 mg total) by mouth daily., Disp: 30 tablet, Rfl: 1   Review of Systems:   Review of Systems  Gastrointestinal: Positive for heartburn.  Negative unless otherwise specified per HPI.   Vitals:   Vitals:   09/06/18 1549  BP: 140/80  Pulse: 78  Temp: 97.9 F (36.6 C)  TempSrc: Oral  SpO2: 96%  Weight: 201 lb (91.2 kg)  Height: 5\' 11"  (1.803 m)     Body mass index is 28.03 kg/m.  Physical Exam:   Physical Exam Vitals signs and nursing note reviewed.  Constitutional:      General: He is not in acute distress.    Appearance: He is well-developed. He is not ill-appearing or toxic-appearing.   Cardiovascular:     Rate and Rhythm: Normal rate and regular rhythm.     Pulses: Normal pulses.     Heart sounds: Normal heart sounds, S1 normal and S2 normal.     Comments: No LE edema Pulmonary:     Effort: Pulmonary effort is normal.     Breath sounds: Normal breath sounds.  Abdominal:     General: Abdomen is  flat. Bowel sounds are normal.     Palpations: Abdomen is soft.     Tenderness: There is abdominal tenderness in the epigastric area. There is no guarding or rebound. Negative signs include Murphy's sign, Rovsing's sign, McBurney's sign, psoas sign and obturator sign.  Skin:    General: Skin is warm and dry.  Neurological:     Mental Status: He is alert.     GCS: GCS eye subscore is 4. GCS verbal subscore is 5. GCS motor subscore is 6.  Psychiatric:        Speech: Speech normal.        Behavior: Behavior normal. Behavior is cooperative.     Assessment and Plan:   Bradley [name] was seen today for heartburn.  Diagnoses and all orders for this visit:  Gastroesophageal reflux disease without esophagitis  Other orders -     pantoprazole (PROTONIX) 20 MG tablet; Take 1 tablet (20 mg total) by mouth daily.   Patient received a GI cocktail in office and had almost complete resolution of symptoms.  Will trial a transition from Prilosec to Protonix at this time.  Stop Prilosec, start Protonix.  Take 20 mg daily Protonix for a week.  I recommended that if he does not have a significant improvement of symptoms, after about 1 week, he could also try 2 x 20 mg tablets Protonix.  Denies any cardiac symptoms at this time.  Recommend close follow-up if symptoms if they do not improve.  I also provided ER precautions should his symptoms change or worsen.   . Reviewed expectations re: course of current medical issues. . Discussed self-management of symptoms. . Outlined signs and symptoms indicating need for more acute intervention. . Patient verbalized understanding and all questions were  answered. . See orders for this visit as documented in the electronic medical record. . Patient received an After-Visit Summary.  CMA or LPN served as scribe during this visit. History, Physical, and Plan performed by medical provider. The above documentation has been reviewed and is accurate and complete.  Inda Coke, PA-C

## 2018-09-21 ENCOUNTER — Encounter: Payer: Self-pay | Admitting: Physician Assistant

## 2018-09-21 ENCOUNTER — Other Ambulatory Visit: Payer: Self-pay | Admitting: Physician Assistant

## 2018-09-21 MED ORDER — PANTOPRAZOLE SODIUM 20 MG PO TBEC: 20.0000 mg | DELAYED_RELEASE_TABLET | Freq: Two times a day (BID) | ORAL | 0 refills | Status: DC

## 2018-12-28 ENCOUNTER — Other Ambulatory Visit: Payer: Self-pay | Admitting: Family Medicine

## 2018-12-28 ENCOUNTER — Other Ambulatory Visit: Payer: Self-pay | Admitting: Physician Assistant

## 2018-12-29 NOTE — Telephone Encounter (Signed)
Patient need to schedule an ov for more refills. Looks like gabapentin was recently sent in by Jasper Loser, NP on 12/23/2018.

## 2018-12-29 NOTE — Telephone Encounter (Signed)
Left message to return phone call.

## 2018-12-31 ENCOUNTER — Ambulatory Visit: Payer: Self-pay | Admitting: Family Medicine

## 2018-12-31 ENCOUNTER — Telehealth: Payer: 59 | Admitting: Family

## 2018-12-31 DIAGNOSIS — M5440 Lumbago with sciatica, unspecified side: Secondary | ICD-10-CM

## 2018-12-31 NOTE — Telephone Encounter (Signed)
Pt. Reports he had accident "years ago with back injury. I have had back surgery." Having pain today that "is worse than it normally is." Left side of back, into his low abdomen and groin and down the leg. Has numbness in the left thigh that "has always been there." Instructed to go to UC for evaluation and pain management. Would also like a virtual visit with Dr. Yong Channel Monday. Contact number [phone]. Please advise pt.  Reason for Disposition . [1] SEVERE back pain (e.g., excruciating, unable to do any normal activities) AND [2] not improved 2 hours after pain medicine  Answer Assessment - Initial Assessment Questions 1. ONSET: "When did the pain begin?"      Today 2. LOCATION: "Where does it hurt?" (upper, mid or lower back)     Left side back - into abdomen, groin and down 3. SEVERITY: "How bad is the pain?"  (e.g., Scale 1-10; mild, moderate, or severe)   - MILD (1-3): doesn't interfere with normal activities    - MODERATE (4-7): interferes with normal activities or awakens from sleep    - SEVERE (8-10): excruciating pain, unable to do any normal activities      8/10 4. PATTERN: "Is the pain constant?" (e.g., yes, no; constant, intermittent)      Constant 5. RADIATION: "Does the pain shoot into your legs or elsewhere?"     Down left leg 6. CAUSE:  "What do you think is causing the back pain?"      Back injury 7. BACK OVERUSE:  "Any recent lifting of heavy objects, strenuous work or exercise?"     No 8. MEDICATIONS: "What have you taken so far for the pain?" (e.g., nothing, acetaminophen, NSAIDS)     Ibu 800 mg has not helped 9. NEUROLOGIC SYMPTOMS: "Do you have any weakness, numbness, or problems with bowel/bladder control?"     Numbness in left thigh - this is not new 10. OTHER SYMPTOMS: "Do you have any other symptoms?" (e.g., fever, abdominal pain, burning with urination, blood in urine)       No 11. PREGNANCY: "Is there any chance you are pregnant?" (e.g., yes, no; LMP)    n/a  Protocols used: BACK PAIN-A-AH

## 2018-12-31 NOTE — Progress Notes (Signed)
Based on what you shared with me, I feel your condition warrants further evaluation and I recommend that you be seen for a face to face office visit.     NOTE: If you entered your credit card information for this eVisit, you will not be charged. You may see a "hold" on your card for the $35 but that hold will drop off and you will not have a charge processed.  If you are having a true medical emergency please call 911.  If you need an urgent face to face visit, [facility] has four urgent care centers for your convenience.    PLEASE NOTE: THE [facility] LOCATIONS AND URGENT CARE CLINICS DO NOT HAVE THE TESTING FOR CORONAVIRUS COVID19 AVAILABLE.  IF YOU FEEL YOU NEED THIS TEST YOU MUST GO TO A TRIAGE LOCATION AT ONE OF THE HOSPITAL EMERGENCY DEPARTMENTS   [email or web address] to reserve your spot online an avoid wait times  [facility] [address], [address] [place], [place] [address] Modified hours of operation: Monday-Friday, 10 AM to 6 PM  Saturday & Sunday 10 AM to 4 PM *Across the street from [employer]  [facility] (New Address!) [address], [address] [place], [place] [address] *Just off [address], across the road from [employer]* Modified hours of operation: Monday-Friday, 10 AM to 5 PM  Closed Saturday & Sunday   The following sites will take your insurance:  . [facility]  [phone] Get Driving Directions Find a Provider at this Location  [address] [place], [place] [address] . 10 am to 8 pm Monday-Friday . 12 pm to 8 pm Saturday-Sunday   . [facility]  [phone] Get Driving Directions Find a Provider at this Location  [address], [address] [place], [place] [address] . 8 am to 8 pm Monday-Friday . 9 am to 6 pm Saturday . 11 am to 6 pm Sunday   .  [facility]  [phone] Get Driving Directions  [address] [place], [place] [address] . 8 am to 8 pm Monday-Friday . 8 am to 4 pm Saturday-Sunday   Your e-visit answers were reviewed by a board certified advanced clinical practitioner to complete your personal care plan.  Thank you for using e-Visits. 

## 2019-01-03 ENCOUNTER — Other Ambulatory Visit: Payer: Self-pay | Admitting: Family Medicine

## 2019-01-03 ENCOUNTER — Encounter: Payer: Self-pay | Admitting: Family Medicine

## 2019-01-03 NOTE — Telephone Encounter (Signed)
LVM for patient it call back and schedule a Virtual visit with Dr. Yong Channel.

## 2019-01-03 NOTE — Telephone Encounter (Signed)
Okay for refill  See phone note from 01/03/19  Pt refused appt for today but stated he will call later to schedule an appt.

## 2019-01-03 NOTE — Telephone Encounter (Signed)
See Triage note 

## 2019-01-03 NOTE — Telephone Encounter (Signed)
FYI  Spoke to pt about back issue and pt stated that it was a kidney stone. Pt stated that it had past this weekend and he is no longer in pain. I also asked pt if he received our call about scheduling a virtual visit due to needing Rx refills. Pt stated that he did not want to come in right now. I informed pt of virtual visit for today but pt declined stating he will call back maybe at the end of the week to see if he can "cut something up".

## 2019-01-06 ENCOUNTER — Ambulatory Visit (INDEPENDENT_AMBULATORY_CARE_PROVIDER_SITE_OTHER): Payer: 59 | Admitting: Family Medicine

## 2019-01-06 ENCOUNTER — Encounter: Payer: Self-pay | Admitting: Family Medicine

## 2019-01-06 VITALS — BP 135/83 | HR 65 | Temp 98.3°F | Ht 71.0 in | Wt 177.0 lb

## 2019-01-06 DIAGNOSIS — I10 Essential (primary) hypertension: Secondary | ICD-10-CM | POA: Diagnosis not present

## 2019-01-06 DIAGNOSIS — G4700 Insomnia, unspecified: Secondary | ICD-10-CM | POA: Diagnosis not present

## 2019-01-06 DIAGNOSIS — E785 Hyperlipidemia, unspecified: Secondary | ICD-10-CM

## 2019-01-06 DIAGNOSIS — K219 Gastro-esophageal reflux disease without esophagitis: Secondary | ICD-10-CM | POA: Diagnosis not present

## 2019-01-06 MED ORDER — DEXLANSOPRAZOLE 60 MG PO CPDR: 60.0000 mg | DELAYED_RELEASE_CAPSULE | Freq: Every day | ORAL | 2 refills | Status: DC

## 2019-01-06 MED ORDER — CLONAZEPAM 0.5 MG PO TABS: 0.5000 mg | ORAL_TABLET | Freq: Every evening | ORAL | 3 refills | Status: DC | PRN

## 2019-01-06 NOTE — Assessment & Plan Note (Signed)
S: mild poorly controlled on atorvastatin 10 mg last visit Lab Results  Component Value Date   CHOL 171 06/25/2018   HDL 39.70 06/25/2018   LDLCALC 101 (H) 06/25/2018   LDLDIRECT 145.0 07/22/2016   TRIG 151.0 (H) 06/25/2018   CHOLHDL 4 06/25/2018   A/P: Hopefully with weight loss that patient's cholesterol now has LDL under 100-we will check at next in person visit-hopefully at physical in 6 months

## 2019-01-06 NOTE — Assessment & Plan Note (Signed)
S:compliant with prilosec 40mg  in the past but he was having some breakthrough. At first prtonix 20mg  was helpful- then had to go to twice a day- then had to add pecid and now still taking mylanta with this. Heartburn in lower chest after meals.   Before he got down to 30 mg dexilant but even at that point he had some breakthrough symptoms occasionally.   b12 injections - advised b12 injections monthly A/P: Poor control of reflux- discussed possibly needing EGD if not controlled on Dexilant.  He does report history of hiatal hernia as well.  I advised to check in in 1 month by video visit- he agrees

## 2019-01-06 NOTE — Progress Notes (Signed)
Phone [phone]   Subjective:  Virtual visit via Video note. Chief complaint: Chief Complaint  Patient presents with  . Medication discussion    Clonazepam and Protonix    This visit type was conducted due to national recommendations for restrictions regarding the COVID-19 Pandemic (e.g. social distancing).  This format is felt to be most appropriate for this patient at this time balancing risks to patient and risks to population by having him in for in person visit.  No physical exam was performed (except for noted visual exam or audio findings with Telehealth visits).    Our team/I connected with Bradley [name] on 01/06/19 at 11:20 AM EDT by a video enabled telemedicine application (doxy.me) and verified that I am speaking with the correct person using two identifiers.  Location patient: Home-O[facility] Location provider: [facility], office Persons participating in the virtual visit:  patient  Our team/I discussed the limitations of evaluation and management by telemedicine and the availability of in person appointments. In light of current covid-19 pandemic, patient also understands that we are trying to protect them by minimizing in office contact if at all possible.  The patient expressed consent for telemedicine visit and agreed to proceed. Patient understands insurance will be billed.   ROS- no fever, chills, vomiting. Does have some lower chest discomfort/burning after meals.    Past Medical History-  Patient Active Problem List   Diagnosis Date Noted  . Insomnia 05/10/2015    Priority: Medium  . Essential hypertension 05/10/2015    Priority: Medium  . Hyperlipidemia 05/12/2007    Priority: Medium  . GERD 05/12/2007    Priority: Medium  . Hemangioma of liver 11/24/2017    Priority: Low  . Gallstones 05/10/2015    Priority: Low  . History of colonic polyps 10/03/2010    Priority: Low  . B12 deficiency 09/05/2009    Priority: Low  . IBS 08/31/2009    Priority: Low  .  ALLERGIC RHINITIS, SEASONAL 07/24/2009    Priority: Low  . External hemorrhoids 06/07/2008    Priority: Low  . Small bowel obstruction ([place]) 11/18/2017  . Pulmonary nodules 06/03/2016  . SBO (small bowel obstruction) ([employer]) 04/11/2016  . Hypokalemia 04/11/2016  . Total bilirubin, elevated 04/11/2016    Medications- reviewed and updated Current Outpatient Medications  Medication Sig Dispense Refill  . atorvastatin (LIPITOR) 10 MG tablet Take 1 tablet (10 mg total) by mouth daily. 90 tablet 3  . clonazePAM (KLONOPIN) 0.5 MG tablet Take 1 tablet (0.5 mg total) by mouth at bedtime as needed. for sleep 30 tablet 3  . cyanocobalamin (,VITAMIN B-12,) 1000 MCG/ML injection Inject 1 mL (1,000 mcg total) into the skin every 30 (thirty) days. 1 mL 5  . gabapentin (NEURONTIN) 300 MG capsule PLEASE SEE ATTACHED FOR DETAILED DIRECTIONS  0  . PAZEO 0.7 % SOLN     . dexlansoprazole (DEXILANT) 60 MG capsule Take 1 capsule (60 mg total) by mouth daily. 30 capsule 2   No current facility-administered medications for this visit.      Objective:  BP 135/83   Pulse 65   Temp 98.3 F (36.8 C) (Oral)   Ht 5\' 11"  (1.803 m)   Wt 177 lb (80.3 kg)   BMI 24.69 kg/m  Gen: NAD, resting comfortably Lungs: nonlabored, normal respiratory rate  Skin: appears dry, no obvious rash Normal speech     Assessment and Plan   Other notes: 1.passed kidney stone last week- thought he was dying. Took old lorcet -  didn't help. THen went to urgent care - by time he got there symptoms had passed.  Pain was radiating down to his abdomen.   #hypertension/formerly overweight S: controlled now controlled on no rx. Stopped amlodipine 2.5 mg in January with the weight loss.   Weight down from 199 on home scales down to 177- son chase got up to 310 and went to nutritionist and got on a weight plan- family doing it along with him.  BP Readings from Last 3 Encounters:  01/06/19 135/83  09/06/18 140/80  06/24/18 130/81   A/P: stable without medication-continue to monitor- congratulated him on his awesome job with weight loss.  #hyperlipidemia S: mild poorly controlled on atorvastatin 10 mg last visit Lab Results  Component Value Date   CHOL 171 06/25/2018   HDL 39.70 06/25/2018   LDLCALC 101 (H) 06/25/2018   LDLDIRECT 145.0 07/22/2016   TRIG 151.0 (H) 06/25/2018   CHOLHDL 4 06/25/2018   A/P: Hopefully with weight loss that patient's cholesterol now has LDL under 100-we will check at next in person visit-hopefully at physical in 6 months  # GERD S:compliant with prilosec 40mg  in the past but he was having some breakthrough. At first prtonix 20mg  was helpful- then had to go to twice a day- then had to add pecid and now still taking mylanta with this. Heartburn in lower chest after meals.   Before he got down to 30 mg dexilant but even at that point he had some breakthrough symptoms occasionally.   b12 injections - advised b12 injections monthly A/P: Poor control of reflux- discussed possibly needing EGD if not controlled on Dexilant.  He does report history of hiatal hernia as well.  I advised to check in in 1 month by video visit- he agrees  #Insomnia S:taking gabapentin every night. Takes clonazepam 3x a week - when he doesn't take it has a very hard time sleeping/more limited hours   Sleep walking on ambien in past A/P: Stable. Continue current medications.  Refilled clonazepam for 3 times a week use  1 month follow-up  Lab/Order associations: Essential hypertension  Hyperlipidemia, unspecified hyperlipidemia type  Gastroesophageal reflux disease without esophagitis  Insomnia, unspecified type  Meds ordered this encounter  Medications  . clonazePAM (KLONOPIN) 0.5 MG tablet    Sig: Take 1 tablet (0.5 mg total) by mouth at bedtime as needed. for sleep    Dispense:  30 tablet    Refill:  3  . dexlansoprazole (DEXILANT) 60 MG capsule    Sig: Take 1 capsule (60 mg total) by mouth daily.     Dispense:  30 capsule    Refill:  2    Changing to this from protonix due to poor control    Return precautions advised.  Garret Reddish, MD

## 2019-01-06 NOTE — Patient Instructions (Signed)
Video visit

## 2019-01-06 NOTE — Assessment & Plan Note (Signed)
S: controlled now controlled on no rx. Stopped amlodipine 2.5 mg in January with the weight loss.   Weight down from 199 on home scales down to 177- son chase got up to 310 and went to nutritionist and got on a weight plan- family doing it along with him.  BP Readings from Last 3 Encounters:  09/06/18 140/80  06/24/18 130/81  03/26/18 (!) 146/90  A/P: stable without medication-continue to monitor- congratulated him on his awesome job with weight loss.

## 2019-02-09 NOTE — Progress Notes (Signed)
Phone [phone]   Subjective:  Virtual visit via Video note. Chief complaint: Chief Complaint  Patient presents with  . Gastroesophageal Reflux   This visit type was conducted due to national recommendations for restrictions regarding the COVID-19 Pandemic (e.g. social distancing).  This format is felt to be most appropriate for this patient at this time balancing risks to patient and risks to population by having him in for in person visit.  No physical exam was performed (except for noted visual exam or audio findings with Telehealth visits).    Our team/I connected with Bradley [name] at 11:20 AM EDT by a video enabled telemedicine application (doxy.me or caregility through epic) and verified that I am speaking with the correct person using two identifiers.  Location patient: Home-[place]2 Location provider: [facility], office Persons participating in the virtual visit:  patient  Our team/I discussed the limitations of evaluation and management by telemedicine and the availability of in person appointments. In light of current covid-19 pandemic, patient also understands that we are trying to protect them by minimizing in office contact if at all possible.  The patient expressed consent for telemedicine visit and agreed to proceed. Patient understands insurance will be billed.   ROS- no chest pain or shortness of breath. No upper abdominal pain. Dealing with a lot of back pain.    Past Medical History-  Patient Active Problem List   Diagnosis Date Noted  . Insomnia 05/10/2015    Priority: Medium  . Essential hypertension 05/10/2015    Priority: Medium  . Hyperlipidemia 05/12/2007    Priority: Medium  . GERD 05/12/2007    Priority: Medium  . Hemangioma of liver 11/24/2017    Priority: Low  . Gallstones 05/10/2015    Priority: Low  . History of colonic polyps 10/03/2010    Priority: Low  . B12 deficiency 09/05/2009    Priority: Low  . IBS 08/31/2009    Priority: Low  . ALLERGIC  RHINITIS, SEASONAL 07/24/2009    Priority: Low  . External hemorrhoids 06/07/2008    Priority: Low  . Small bowel obstruction ([place]) 11/18/2017  . Pulmonary nodules 06/03/2016  . SBO (small bowel obstruction) ([place]) 04/11/2016  . Hypokalemia 04/11/2016  . Total bilirubin, elevated 04/11/2016    Medications- reviewed and updated Current Outpatient Medications  Medication Sig Dispense Refill  . atorvastatin (LIPITOR) 10 MG tablet Take 1 tablet (10 mg total) by mouth daily. 90 tablet 3  . clonazePAM (KLONOPIN) 0.5 MG tablet Take 1 tablet (0.5 mg total) by mouth at bedtime as needed. for sleep 30 tablet 3  . cyanocobalamin (,VITAMIN B-12,) 1000 MCG/ML injection Inject 1 mL (1,000 mcg total) into the skin every 30 (thirty) days. 1 mL 5  . gabapentin (NEURONTIN) 300 MG capsule PLEASE SEE ATTACHED FOR DETAILED DIRECTIONS  0  . Dexlansoprazole 30 MG capsule Take 1 capsule (30 mg total) by mouth daily. 90 capsule 3   No current facility-administered medications for this visit.      Objective:  BP 131/81   Pulse 69   Temp 98.3 F (36.8 C) (Oral)   Ht 5\' 11"  (1.803 m)   Wt 179 lb 9.6 oz (81.5 kg)   BMI 25.05 kg/m  self reported vitals Gen: NAD, resting comfortably Lungs: nonlabored, normal respiratory rate  Skin: appears dry, no obvious rash     Assessment and Plan   # GERD S:Reflux has completely resolved on dixilant. He does have some nausea for first hour or so after taking  medicine- takes it before he eats.   A/P: Seems like flare of reflux is doing better after getting off prilosec 40mg  and back on dexilant 60mg - has done this for over a month now. dexilant 30mg  has worked in past - would like to retrial (did have some nausea on the 30mg  as well- had some breakthrough) - will try #90 of this- but want him to wait until after he is off ibuprofen for his back.   # Back pain/insomnia/higher HR S:back pain has been flaring up recently. Taking gabapentin at night right now and able to  fall asleep without clonazepam.    Taking 800mg  ibuprofen 3x a day for 4-5 days.  leg is getting. Sees Dr. Vertell Limber -Sees him June 3rd. Feels heart beating when he wakes up at night- feels anxious but admits Waking up due to to hurting. Wondered if it was the ibuprofen A/P: Insomnia with poor control due to pain- continue gabapentin as works for back and sleep and hold benzodiazepine for now. Sounds like waking up in pain at tunes- told him I thought the pain was cause of anxiety and high hr- not likely ibuprofen. Did warn of ibuprofen risks and trying to only do higher dose short term and also risks for ulceration/worsening reflux. If has new or worsening symptoms should let us know  CPE planned- hopefully do flu shot as well Future Appointments  Date Time Provider [place]  07/18/2019  9:20 AM Marin Olp, MD [facility]   Lab/Order associations: Gastroesophageal reflux disease without esophagitis  Insomnia, unspecified type  Meds ordered this encounter  Medications  . atorvastatin (LIPITOR) 10 MG tablet    Sig: Take 1 tablet (10 mg total) by mouth daily.    Dispense:  90 tablet    Refill:  3    Please submit refill requests electronically in the future  . Dexlansoprazole 30 MG capsule    Sig: Take 1 capsule (30 mg total) by mouth daily.    Dispense:  90 capsule    Refill:  3   Return precautions advised.  Garret Reddish, MD

## 2019-02-10 ENCOUNTER — Ambulatory Visit (INDEPENDENT_AMBULATORY_CARE_PROVIDER_SITE_OTHER): Payer: 59 | Admitting: Family Medicine

## 2019-02-10 ENCOUNTER — Encounter: Payer: Self-pay | Admitting: Family Medicine

## 2019-02-10 VITALS — BP 131/81 | HR 69 | Temp 98.3°F | Ht 71.0 in | Wt 179.6 lb

## 2019-02-10 DIAGNOSIS — K219 Gastro-esophageal reflux disease without esophagitis: Secondary | ICD-10-CM

## 2019-02-10 DIAGNOSIS — G4700 Insomnia, unspecified: Secondary | ICD-10-CM

## 2019-02-10 MED ORDER — DEXLANSOPRAZOLE 30 MG PO CPDR: 30.0000 mg | DELAYED_RELEASE_CAPSULE | Freq: Every day | ORAL | 3 refills | Status: DC

## 2019-02-10 MED ORDER — ATORVASTATIN CALCIUM 10 MG PO TABS: 10.0000 mg | ORAL_TABLET | Freq: Every day | ORAL | 3 refills | Status: DC

## 2019-02-10 NOTE — Patient Instructions (Addendum)
There are no preventive care reminders to display for this patient.  Depression screen [facility] 2/9 01/06/2019 08/19/2017  Decreased Interest 0 0  Down, Depressed, Hopeless 0 0  PHQ - 2 Score 0 0   Video visit

## 2019-02-22 ENCOUNTER — Other Ambulatory Visit: Payer: Self-pay | Admitting: Family Medicine

## 2019-03-02 ENCOUNTER — Other Ambulatory Visit: Payer: Self-pay | Admitting: Neurosurgery

## 2019-03-02 DIAGNOSIS — M5127 Other intervertebral disc displacement, lumbosacral region: Secondary | ICD-10-CM

## 2019-03-18 ENCOUNTER — Ambulatory Visit
Admission: RE | Admit: 2019-03-18 | Discharge: 2019-03-18 | Disposition: A | Discharge status: Home / Self Care | Payer: 59 | Source: Ambulatory Visit | Attending: Neurosurgery | Admitting: Neurosurgery

## 2019-03-18 ENCOUNTER — Other Ambulatory Visit: Payer: Self-pay | Admitting: Family Medicine

## 2019-03-18 ENCOUNTER — Other Ambulatory Visit: Payer: Self-pay | Admitting: Neurosurgery

## 2019-03-18 DIAGNOSIS — Z77018 Contact with and (suspected) exposure to other hazardous metals: Secondary | ICD-10-CM

## 2019-03-18 DIAGNOSIS — M5127 Other intervertebral disc displacement, lumbosacral region: Secondary | ICD-10-CM

## 2019-03-18 MED ORDER — GADOBENATE DIMEGLUMINE 529 MG/ML IV SOLN
18.0000 mL | Freq: Once | INTRAVENOUS | Status: AC | PRN
  Administered 2019-03-18: 18 mL via INTRAVENOUS

## 2019-04-02 ENCOUNTER — Other Ambulatory Visit: Payer: Self-pay | Admitting: Family Medicine

## 2019-04-18 ENCOUNTER — Encounter: Payer: Self-pay | Admitting: Family Medicine

## 2019-04-21 NOTE — Telephone Encounter (Signed)
I let this patient know that you were on vacation and would see their message when you returned to the office.   Thanks,  [place] Nunnery

## 2019-06-08 ENCOUNTER — Encounter: Payer: Self-pay | Admitting: Gastroenterology

## 2019-06-20 DIAGNOSIS — M5416 Radiculopathy, lumbar region: Secondary | ICD-10-CM

## 2019-06-20 DIAGNOSIS — M5126 Other intervertebral disc displacement, lumbar region: Secondary | ICD-10-CM

## 2019-06-20 HISTORY — DX: Other intervertebral disc displacement, lumbar region: M51.26

## 2019-06-20 HISTORY — DX: Radiculopathy, lumbar region: M54.16

## 2019-06-21 ENCOUNTER — Encounter: Payer: Self-pay | Admitting: Family Medicine

## 2019-07-12 ENCOUNTER — Encounter: Payer: Self-pay | Admitting: Physical Therapy

## 2019-07-12 HISTORY — PX: OTHER SURGICAL HISTORY: SHX169

## 2019-07-12 HISTORY — PX: BACK SURGERY: SHX140

## 2019-07-13 NOTE — Progress Notes (Signed)
Patient cancelled visit after precharting started

## 2019-07-13 NOTE — Patient Instructions (Signed)
Health Maintenance Due  Topic Date Due  . INFLUENZA VACCINE  04/23/2019  . COLONOSCOPY  07/04/2019   Depression screen [facility] 2/9 01/06/2019 08/19/2017  Decreased Interest 0 0  Down, Depressed, Hopeless 0 0  PHQ - 2 Score 0 0

## 2019-07-18 ENCOUNTER — Ambulatory Visit: Payer: 59 | Admitting: Family Medicine

## 2019-08-16 ENCOUNTER — Other Ambulatory Visit: Payer: Self-pay

## 2019-08-17 ENCOUNTER — Ambulatory Visit (INDEPENDENT_AMBULATORY_CARE_PROVIDER_SITE_OTHER): Payer: 59 | Admitting: Family Medicine

## 2019-08-17 ENCOUNTER — Encounter: Payer: Self-pay | Admitting: Family Medicine

## 2019-08-17 VITALS — BP 138/62 | HR 68 | Temp 98.4°F | Ht 70.5 in | Wt 191.4 lb

## 2019-08-17 DIAGNOSIS — I10 Essential (primary) hypertension: Secondary | ICD-10-CM

## 2019-08-17 DIAGNOSIS — E538 Deficiency of other specified B group vitamins: Secondary | ICD-10-CM | POA: Diagnosis not present

## 2019-08-17 DIAGNOSIS — E785 Hyperlipidemia, unspecified: Secondary | ICD-10-CM

## 2019-08-17 DIAGNOSIS — Z86010 Personal history of colonic polyps: Secondary | ICD-10-CM

## 2019-08-17 DIAGNOSIS — Z0000 Encounter for general adult medical examination without abnormal findings: Secondary | ICD-10-CM | POA: Diagnosis not present

## 2019-08-17 DIAGNOSIS — Z125 Encounter for screening for malignant neoplasm of prostate: Secondary | ICD-10-CM

## 2019-08-17 DIAGNOSIS — K219 Gastro-esophageal reflux disease without esophagitis: Secondary | ICD-10-CM

## 2019-08-17 DIAGNOSIS — D1803 Hemangioma of intra-abdominal structures: Secondary | ICD-10-CM

## 2019-08-17 LAB — CBC WITH DIFFERENTIAL/PLATELET
Basophils Absolute: 0 10*3/uL (ref 0.0–0.1)
Basophils Relative: 0.3 % (ref 0.0–3.0)
Eosinophils Absolute: 0.1 10*3/uL (ref 0.0–0.7)
Eosinophils Relative: 1.3 % (ref 0.0–5.0)
HCT: 43.7 % (ref 39.0–52.0)
Hemoglobin: 14.9 g/dL (ref 13.0–17.0)
Lymphocytes Relative: 19.6 % (ref 12.0–46.0)
Lymphs Abs: 1.1 10*3/uL (ref 0.7–4.0)
MCHC: 34.1 g/dL (ref 30.0–36.0)
MCV: 91.1 fl (ref 78.0–100.0)
Monocytes Absolute: 0.4 10*3/uL (ref 0.1–1.0)
Monocytes Relative: 7.3 % (ref 3.0–12.0)
Neutro Abs: 3.9 10*3/uL (ref 1.4–7.7)
Neutrophils Relative %: 71.5 % (ref 43.0–77.0)
Platelets: 215 10*3/uL (ref 150.0–400.0)
RBC: 4.8 Mil/uL (ref 4.22–5.81)
RDW: 12.9 % (ref 11.5–15.5)
WBC: 5.4 10*3/uL (ref 4.0–10.5)

## 2019-08-17 LAB — COMPREHENSIVE METABOLIC PANEL
ALT: 43 U/L (ref 0–53)
AST: 26 U/L (ref 0–37)
Albumin: 4.4 g/dL (ref 3.5–5.2)
Alkaline Phosphatase: 79 U/L (ref 39–117)
BUN: 16 mg/dL (ref 6–23)
CO2: 31 mEq/L (ref 19–32)
Calcium: 9.6 mg/dL (ref 8.4–10.5)
Chloride: 103 mEq/L (ref 96–112)
Creatinine, Ser: 0.82 mg/dL (ref 0.40–1.50)
GFR: 97.54 mL/min (ref >60.00)
Glucose, Bld: 89 mg/dL (ref 70–99)
Potassium: 4.3 mEq/L (ref 3.5–5.1)
Sodium: 141 mEq/L (ref 135–145)
Total Bilirubin: 0.9 mg/dL (ref 0.2–1.2)
Total Protein: 6.6 g/dL (ref 6.0–8.3)

## 2019-08-17 LAB — PSA: PSA: 1.27 ng/mL (ref 0.10–4.00)

## 2019-08-17 LAB — LIPID PANEL
Cholesterol: 193 mg/dL (ref 0–200)
HDL: 48.8 mg/dL (ref >39.00)
LDL Cholesterol: 120 mg/dL — ABNORMAL HIGH (ref 0–99)
NonHDL: 144.59
Total CHOL/HDL Ratio: 4
Triglycerides: 123 mg/dL (ref 0.0–149.0)
VLDL: 24.6 mg/dL (ref 0.0–40.0)

## 2019-08-17 LAB — VITAMIN B12: Vitamin B-12: 430 pg/mL (ref 211–911)

## 2019-08-17 MED ORDER — PANTOPRAZOLE SODIUM 40 MG PO TBEC: 40.0000 mg | DELAYED_RELEASE_TABLET | Freq: Every day | ORAL | 5 refills | Status: DC

## 2019-08-17 NOTE — Progress Notes (Signed)
duplicate

## 2019-08-17 NOTE — Patient Instructions (Addendum)
Health Maintenance Due  Topic Date Due  . COLONOSCOPY- Patient will schedule an appointment with [facility] with Dr. Silverio Decamp 07/04/2019   - wants to try protonix again- we will trial 40mg  and if doing well in 6-8 weeks will let me know and can try 20 mg -stop dexilant and use protonix in its place  Please stop by lab before you go If you do not have mychart- we will call you about results within 5 business days of [place] receiving them.  If you have mychart- we will send your results within 3 business days of [place] receiving them.  If abnormal or we want to clarify a result, we will call or mychart you to make sure you receive the message.  If you have questions or concerns or don't hear within 5-7 days, please send [place] a message or call us.

## 2019-08-17 NOTE — Progress Notes (Signed)
Phone: [phone]   Subjective:  Patient presents today for their annual physical. Chief complaint-noted.   See problem oriented charting- ROS- full  review of systems was completed and negative   The following were reviewed and entered/updated in epic: Past Medical History:  Diagnosis Date  . Allergy   . Blood transfusion without reported diagnosis   . Cough variant asthma 10/12/2007   no per pt 06-19-16  . Elevated LFTs   . Esophageal reflux   . GERD (gastroesophageal reflux disease)   . Herniated nucleus pulposus, L3-4 left 06/20/2019  . Hiatal hernia   . Hyperlipidemia   . Irritable bowel syndrome   . LATERAL EPICONDYLITIS 03/07/2010   Qualifier: Diagnosis of  By: Elease Hashimoto MD, Bruce  left shoulder arthrits per pt 06-19-16  . Liver lesion   . Lumbar radiculopathy 06/20/2019  . Lung mass   . Personal history of colonic polyps 09/28/2009    ADENOMATOUS POLYP  . PONV (postoperative nausea and vomiting)   . Schatzki's ring   . Vitamin B 12 deficiency    Patient Active Problem List   Diagnosis Date Noted  . Insomnia 05/10/2015    Priority: Medium  . Essential hypertension 05/10/2015    Priority: Medium  . Hyperlipidemia 05/12/2007    Priority: Medium  . GERD 05/12/2007    Priority: Medium  . Hemangioma of liver 11/24/2017    Priority: Low  . Gallstones 05/10/2015    Priority: Low  . History of colonic polyps 10/03/2010    Priority: Low  . B12 deficiency 09/05/2009    Priority: Low  . IBS 08/31/2009    Priority: Low  . ALLERGIC RHINITIS, SEASONAL 07/24/2009    Priority: Low  . External hemorrhoids 06/07/2008    Priority: Low  . Lumbar radiculopathy 06/20/2019  . Herniated nucleus pulposus, L3-4 left 06/20/2019  . Small bowel obstruction ([place]) 11/18/2017  . Pulmonary nodules 06/03/2016  . SBO (small bowel obstruction) ([place]) 04/11/2016  . Hypokalemia 04/11/2016  . Total bilirubin, elevated 04/11/2016   Past Surgical History:  Procedure Laterality Date  .  COLONOSCOPY    . Exploration Laparotomy and Small Bowel resection    . LEG SURGERY     left  . LUMBAR LAMINECTOMY     x3 last one June 2017  . microdisctomy  07/12/2019   L3-L4  . UPPER GASTROINTESTINAL ENDOSCOPY    . VENTRAL HERNIA REPAIR    . VIDEO BRONCHOSCOPY Bilateral 06/10/2016   Procedure: VIDEO BRONCHOSCOPY WITH FLUORO;  Surgeon: Marshell Garfinkel, MD;  Location: [facility];  Service: Cardiopulmonary;  Laterality: Bilateral;  . VIDEO BRONCHOSCOPY WITH ENDOBRONCHIAL ULTRASOUND N/A 09/08/2016   Procedure: VIDEO BRONCHOSCOPY WITH ENDOBRONCHIAL ULTRASOUND;  Surgeon: Marshell Garfinkel, MD;  Location: [place] [facility];  Service: Pulmonary;  Laterality: N/A;    Family History  Problem Relation Age of Onset  . Breast cancer Mother   . Thyroid cancer Mother   . Colon cancer Neg Hx   . Esophageal cancer Neg Hx   . Rectal cancer Neg Hx   . Stomach cancer Neg Hx     Medications- reviewed and updated Current Outpatient Medications  Medication Sig Dispense Refill  . clonazePAM (KLONOPIN) 0.5 MG tablet Take 1 tablet (0.5 mg total) by mouth at bedtime as needed. for sleep 30 tablet 3  . cyanocobalamin (,VITAMIN B-12,) 1000 MCG/ML injection INJECT 1 ML (1,000 MCG TOTAL) INTO THE SKIN EVERY 30 (THIRTY) DAYS. 1 mL 5  . Dexlansoprazole 30 MG capsule Take 1 capsule (30 mg total) by  mouth daily. 90 capsule 3  . gabapentin (NEURONTIN) 300 MG capsule PLEASE SEE ATTACHED FOR DETAILED DIRECTIONS  0  . atorvastatin (LIPITOR) 10 MG tablet Take 1 tablet (10 mg total) by mouth daily. 90 tablet 3   No current facility-administered medications for this visit.     Allergies-reviewed and updated Allergies  Allergen Reactions  . No Known Allergies     Social History   Social History Narrative   Married, lives with wife and son   OCCUPATION: Physiological scientist    Objective  Objective:  BP 138/62   Pulse 68   Temp 98.4 F (36.9 C) (Temporal)   Ht 5' 10.5" (1.791 m)   Wt 191 lb 6.4 oz (86.8 kg)   SpO2  97%   BMI 27.07 kg/m  Gen: NAD, resting comfortably HEENT: Mask not removed due to covid 19. TM normal. Bridge of nose normal. Eyelids normal.  Neck: no thyromegaly or cervical lymphadenopathy  CV: RRR no murmurs rubs or gallops Lungs: CTAB no crackles, wheeze, rhonchi Abdomen: soft/nontender/nondistended/normal bowel sounds. No rebound or guarding.  Ext: no edema Skin: warm, dry Neuro: grossly normal, moves all extremities, PERRLA    Assessment and Plan  53 y.o. male presenting for annual physical.  Health Maintenance counseling: 1. Anticipatory guidance: Patient counseled regarding regular dental exams q6 months, eye exams yearly,  avoiding smoking and second hand smoke , limiting alcohol to 2 beverages per week.   2. Risk factor reduction:  Advised patient of need for regular exercise and diet rich and fruits and vegetables to reduce risk of heart attack and stroke. Exercise- not often due to fatigue with work- very active with work- 18-20k steps a day.  Diet-trying to eat healthier option. He attributes weight gain to sugery and being less active- was up 11 lbs on home scales.  Wt Readings from Last 3 Encounters:  08/17/19 191 lb 6.4 oz (86.8 kg)  02/10/19 179 lb 9.6 oz (81.5 kg)  01/06/19 177 lb (80.3 kg)  3. Immunizations/screenings/ancillary studies-discussed Shingrix today- defers for now  Immunization History  Administered Date(s) Administered  . Influenza Inj Mdck Quad With Preservative 07/29/2019  . Influenza Split 07/14/2011  . Influenza,inj,Quad PF,6+ Mos 06/27/2013, 07/30/2015, 06/17/2016, 08/19/2017, 06/24/2018  . Td 06/23/1999, 08/25/2008  . Tdap 06/21/2016  4. Prostate cancer screening- trend PSA today-prior trend low risk Lab Results  Component Value Date   PSA 1.38 06/25/2018   PSA 1.23 08/19/2017   5. Colon cancer screening - last done on 07/02/2016. History of colonic polyps-due at this time and discussed today- he is going to call to schedule- got pushed back  due to neurosugery  6. Skin cancer screening-no dermatologist recently.  Advised regular sunscreen use. Denies worrisome, changing, or new skin lesions. Has a stable spot on back that itches that he is considering asking dermatology to remove- was not removed when he had lesions removed in past but they were aware of it and did not think cancerous.  7. Never smoker  Status of chronic or acute concerns   Surgery with Dr. Vertell Limber October 20th. Still having som eongoing back pain. Microdiskectomy L3-L4.   PHQ 9 elevated today-poor sleep, change in appetite, fatigue contribute to all 8 points  -his gabapentin does help him sleep and hed like to stick with that- started for his back -Patient does use clonazepam as needed for sleep when not on gabapentin.   -feels like appetite off as not eating like he should- plans to reverse this  Essential hypertension-good control today without medication though high normal- usually 130s at home  Hyperlipidemia, unspecified hyperlipidemia type-compliant with atorvastatin 10 mg.  Update lipid panel today with LDL goal ideally under 70 but target at least under 100 -discussed increasing dose to 10mg  if LDL still over 100- otherwise work on lifestyle changes.  Lab Results  Component Value Date   CHOL 171 06/25/2018   HDL 39.70 06/25/2018   LDLCALC 101 (H) 06/25/2018   LDLDIRECT 145.0 07/22/2016   TRIG 151.0 (H) 06/25/2018   CHOLHDL 4 06/25/2018    Gastroesophageal reflux disease without esophagitis-compliant with Dexilant 30 mg-reports good control but does have some nausea.   -tried protonix about a year ago and didn't work - prilosec didn't work in the past even at 40 mg - wants to try protonix again- we will trial 40mg  and if doing well in 6-8 weeks will let me know and can try 20 mg  Hemangioma of liver-incidental finding on prior imaging  B12 deficiency-B12 injections monthly in the past- does those at home- nephew is an Therapist, sports   Recommended follow up:  Return in about 6 months (around 02/14/2020) for f/u for cholesterol and GERD.  Lab/Order associations: fasting   ICD-10-CM   1. Preventative health care  Z00.00   2. Essential hypertension  I10   3. Hyperlipidemia, unspecified hyperlipidemia type  E78.5   4. Gastroesophageal reflux disease without esophagitis  K21.9   5. Hemangioma of liver  D18.03   6. History of colonic polyps  Z86.010   7. B12 deficiency  E53.8     No orders of the defined types were placed in this encounter.   Return precautions advised.  Garret Reddish, MD

## 2019-09-11 IMAGING — MR MRI LUMBAR SPINE WITHOUT AND WITH CONTRAST
7 series · 48 of 48 positions shown · IV contrast (18 ml multihance)
Comparison: 01/27/2018

CLINICAL DATA: Chronic low back pain radiating to the left buttock
and leg.

EXAM:
MRI LUMBAR SPINE WITHOUT AND WITH CONTRAST
TECHNIQUE: Multiplanar and multiecho pulse sequences of the lumbar spine were
obtained without and with intravenous contrast.
CONTRAST:  18mL MULTIHANCE GADOBENATE DIMEGLUMINE 529 MG/ML IV SOLN

[Series 2: T1 · sagittal · 4.0mm · 0.88mm/px · 3 of 13 slices shown (1 of 2)]
[im 1/13]
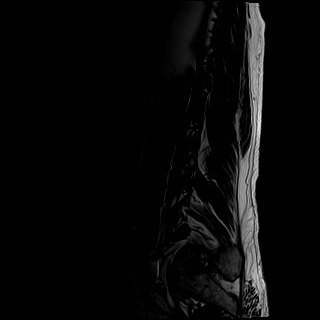
[im 7/13]
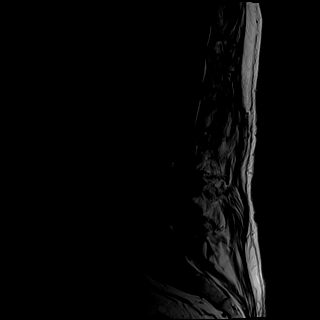
[im 13/13]
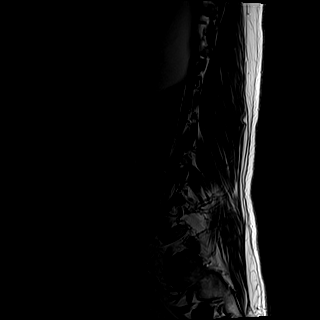

[Series 3: tirm sag · sagittal · 4.0mm · 0.55mm/px · 4 of 13 slices shown]
[im 1/13]
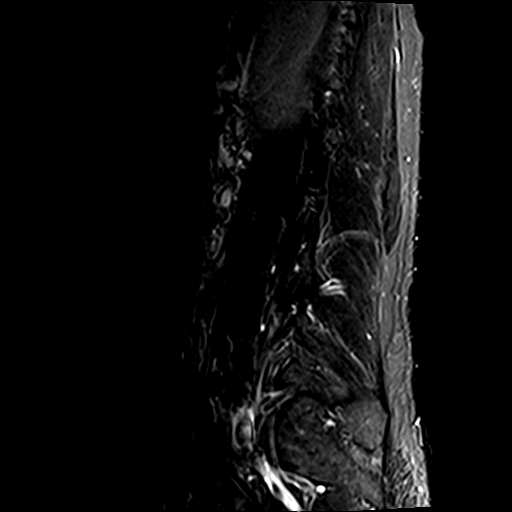
[im 5/13]
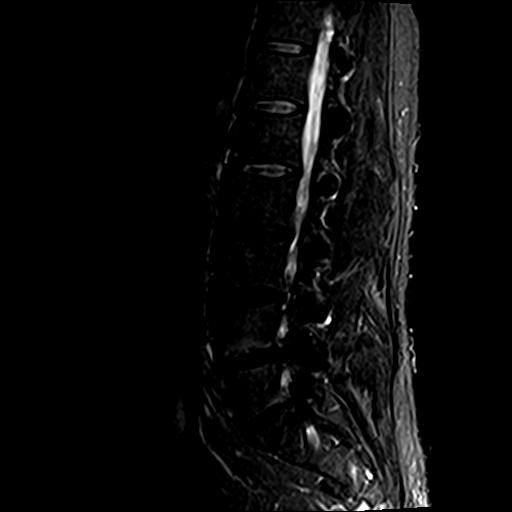
[im 9/13]
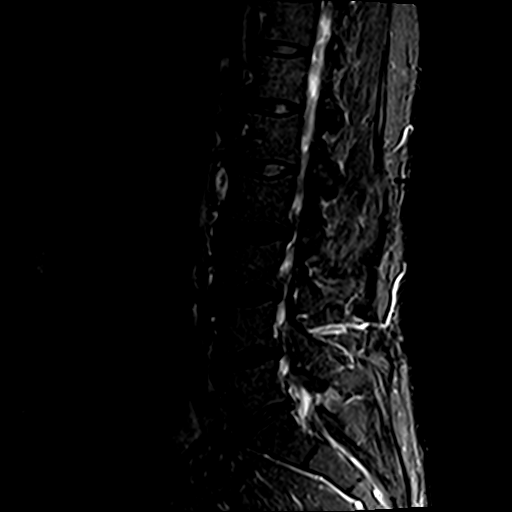
[im 13/13]
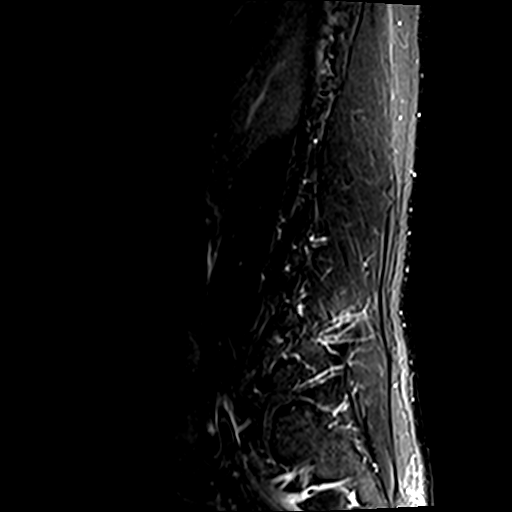

[Series 4: T1 · axial · 4.0mm · 0.78mm/px · z∈[-109,+117]mm · 11 of 39 slices shown (2 of 2)]
[im 1/39]
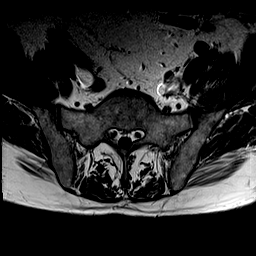
[im 4/39]
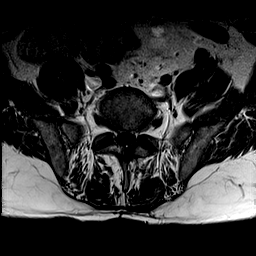
[im 8/39]
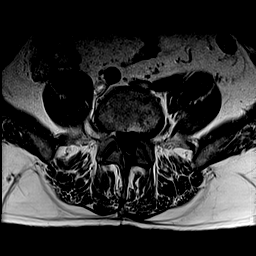
[im 12/39]
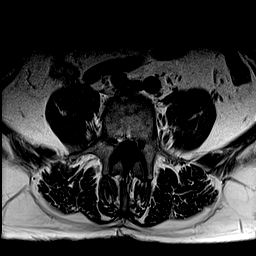
[im 16/39]
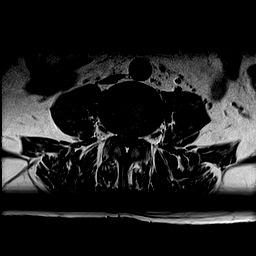
[im 20/39]
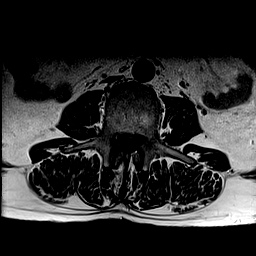
[im 23/39]
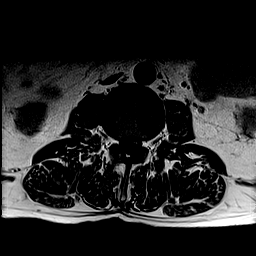
[im 27/39]
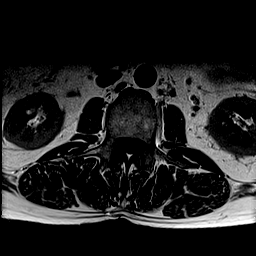
[im 31/39]
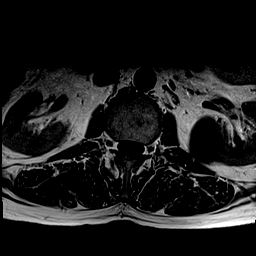
[im 35/39]
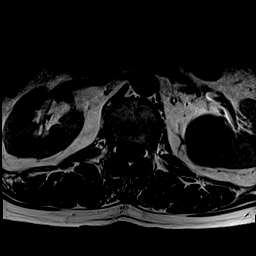
[im 39/39]
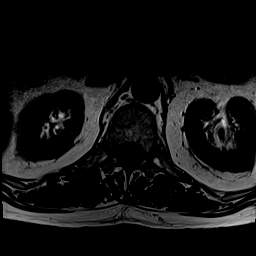

[Series 5: T2 · axial · 4.0mm · 0.78mm/px · z∈[-109,+117]mm · 11 of 39 slices shown]
[im 1/39]
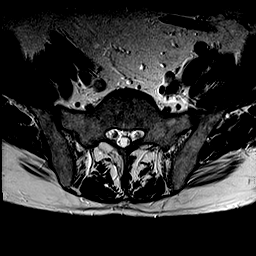
[im 4/39]
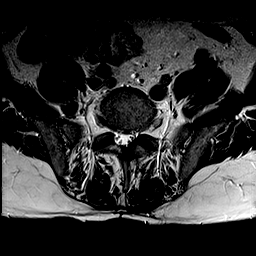
[im 8/39]
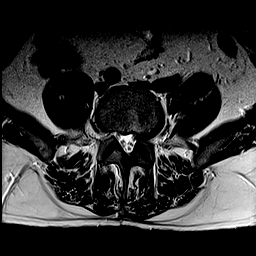
[im 12/39]
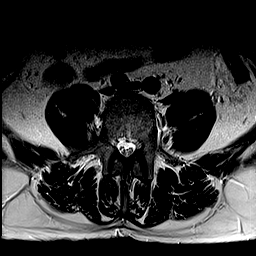
[im 16/39]
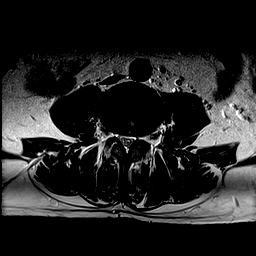
[im 20/39]
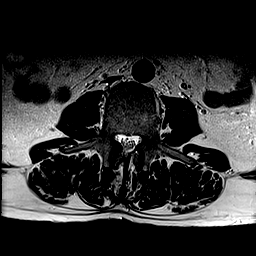
[im 23/39]
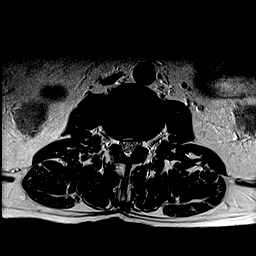
[im 27/39]
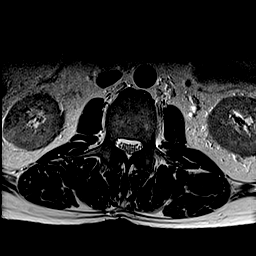
[im 31/39]
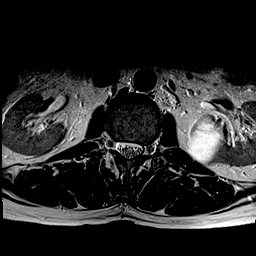
[im 35/39]
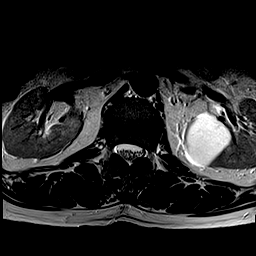
[im 39/39]
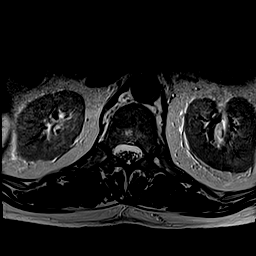

[Series 6: T2 post-contrast · sagittal · 4.0mm · 0.88mm/px · 4 of 13 slices shown]
[im 1/13]
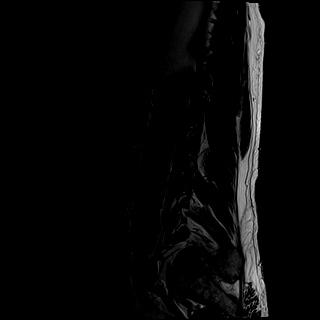
[im 5/13]
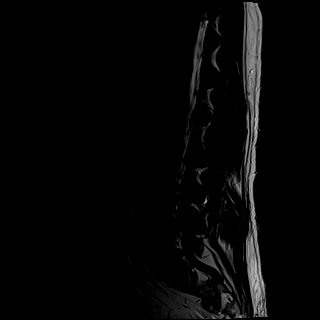
[im 9/13]
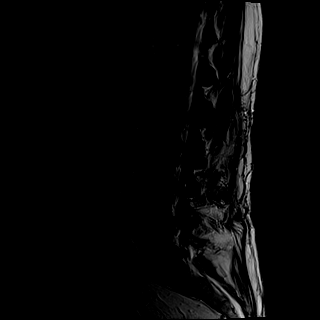
[im 13/13]
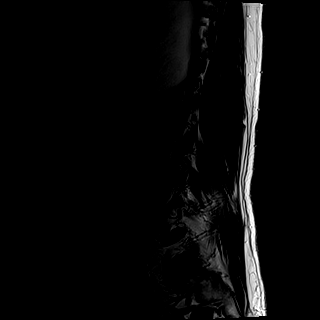

[Series 7: T1 fat-sat post-contrast · sagittal · 4.0mm · 0.88mm/px · 4 of 13 slices shown]
[im 1/13]
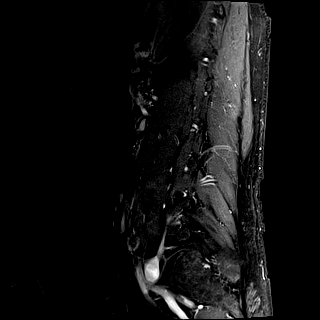
[im 5/13]
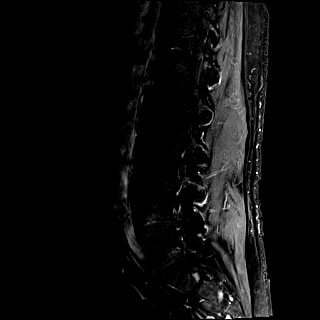
[im 9/13]
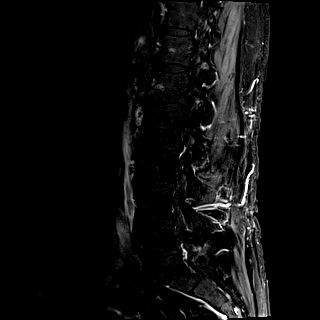
[im 13/13]
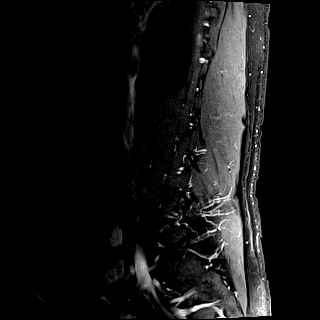

[Series 8: T1 post-contrast · axial · 4.0mm · 0.78mm/px · z∈[-109,+117]mm · 11 of 39 slices shown]
[im 1/39]
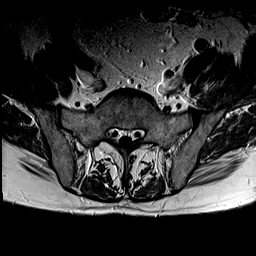
[im 4/39]
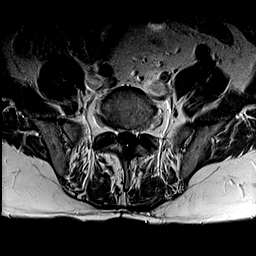
[im 8/39]
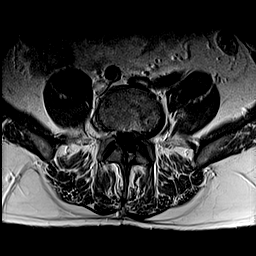
[im 12/39]
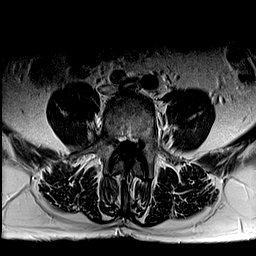
[im 16/39]
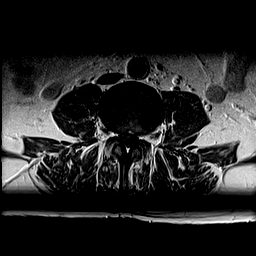
[im 20/39]
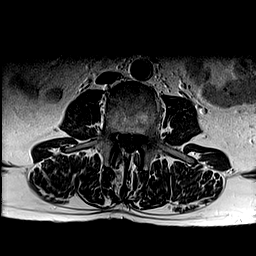
[im 23/39]
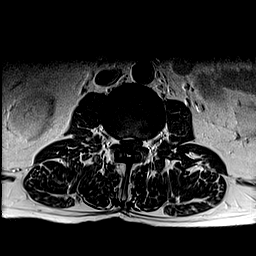
[im 27/39]
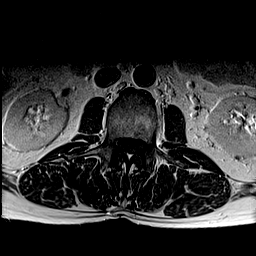
[im 31/39]
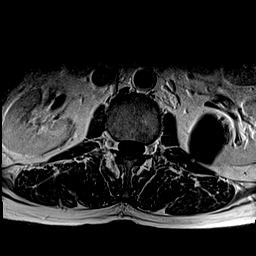
[im 35/39]
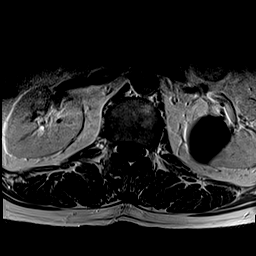
[im 39/39]
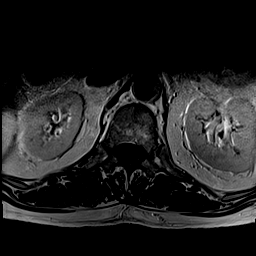

[48 of 48 positions shown; findings below may reference images not displayed]

FINDINGS: Segmentation:  Normal

Alignment:  Normal

Vertebrae:  No fracture, evidence of discitis, or bone lesion.

Conus medullaris and cauda equina: Conus extends to the T12 level.
Conus and cauda equina appear normal.

Paraspinal and other soft tissues: Unchanged left renal cyst
measuring 4.2 cm.

Disc levels:

T11-12: Normal.

T12-L1: Normal.

L1-2: Normal.

L2-3: Small central disc protrusion and annular fissure, unchanged.
Mild spinal canal stenosis with narrowing of the right lateral
recess. No neural foraminal stenosis.

L3-4: New/worsened left subarticular disc protrusion and annular
fissure narrowing the left lateral recess and displacing the
descending left L4 nerve root. Mild spinal canal stenosis. No neural
foraminal stenosis. Normal facets.

L4-5: Disc space narrowing with mild bulge, greatest at the left
neural foramen. No central spinal canal stenosis. Mild left
foraminal stenosis.

L5-S1: Small central disc protrusion, unchanged. Mild narrowing of
the right lateral recess. No central spinal canal stenosis.
IMPRESSION: 1. New left subarticular disc protrusion at L3-4 that narrows the
left lateral recess and displaces the descending left L4 nerve root.
Mild central spinal canal stenosis.
2. Unchanged L2-3 mild spinal canal stenosis and right lateral
recess narrowing.
3. Mild right lateral recess stenosis at L5-S1, unchanged.

## 2019-09-11 IMAGING — CR ORBITS FOR FOREIGN BODY - 2 VIEW
2 series · 2 of 2 positions shown · non-contrast
Comparison: January 27, 2018

CLINICAL DATA: Metal working/exposure; clearance prior to MRI

EXAM:
ORBITS FOR FOREIGN BODY - 2 VIEW

[w orbit pa (1 of 2)]
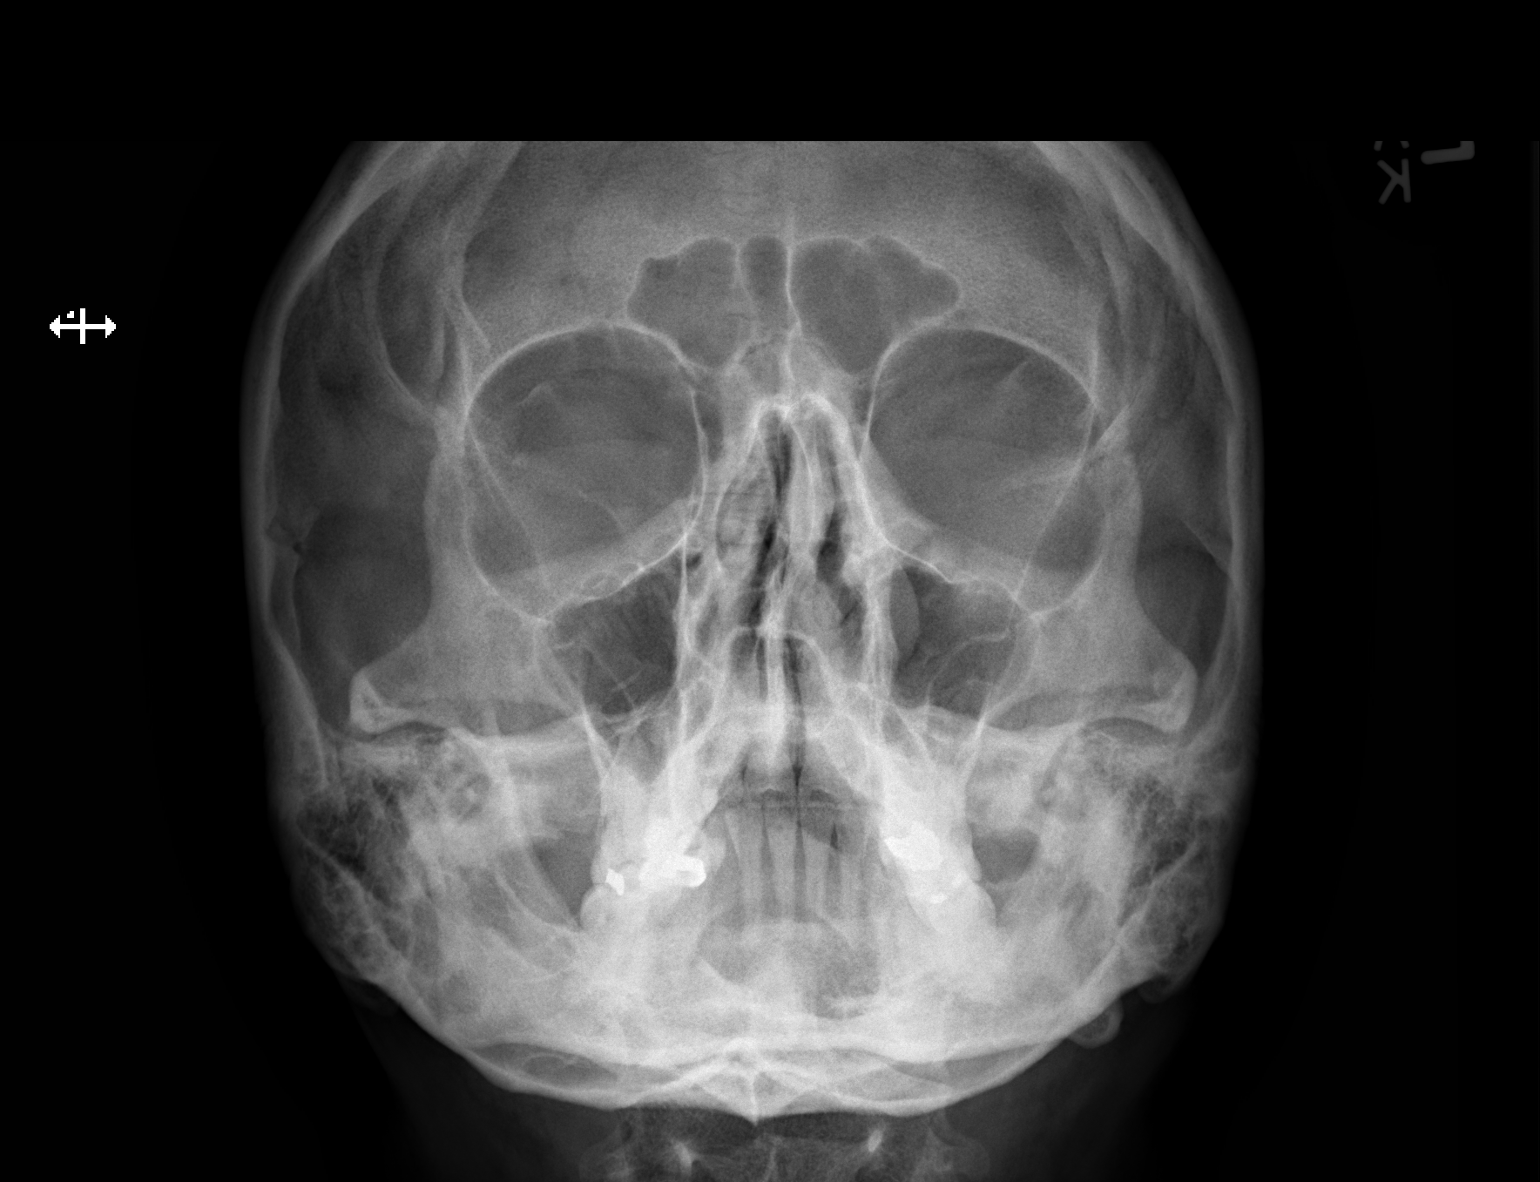

[w orbit pa (2 of 2)]
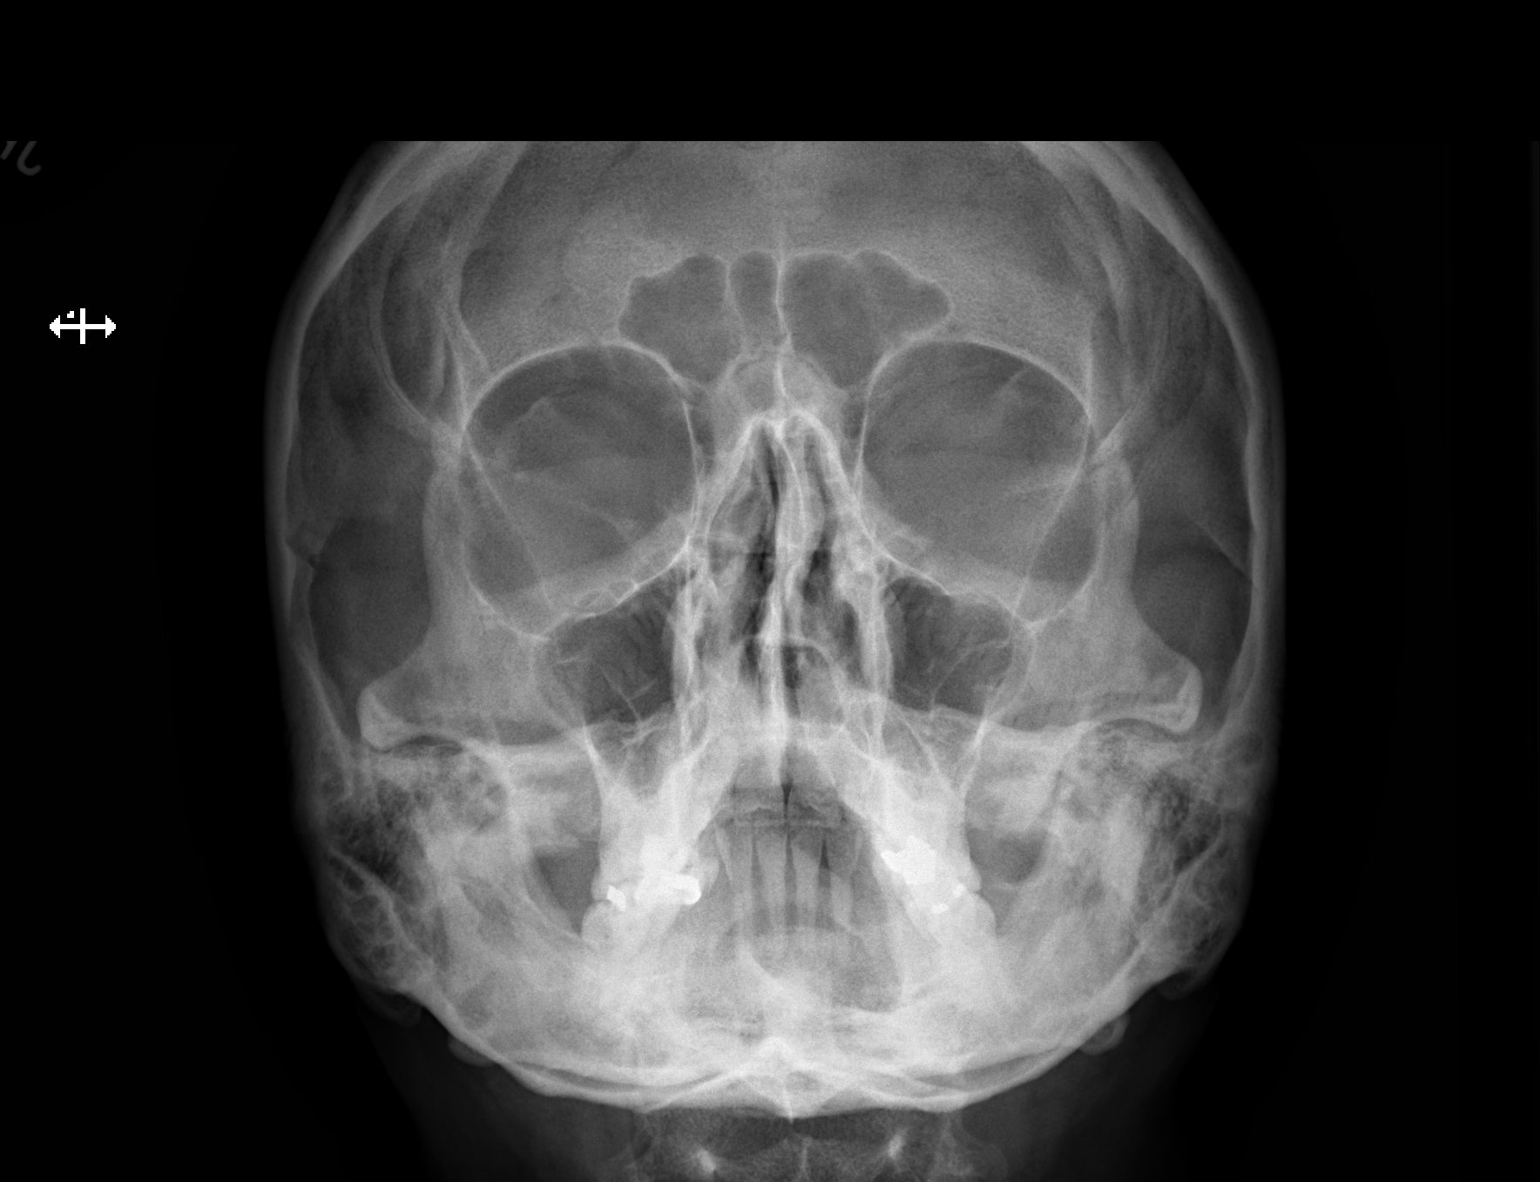

[2 of 2 positions shown; findings below may reference images not displayed]

FINDINGS: Water's views with eyes deviated toward the left and toward the
right were obtained. There is no intraorbital radiopaque foreign
body. Paranasal sinuses are clear. No fracture or dislocation. There
is leftward deviation of the nasal septum.
IMPRESSION: No evidence of metallic foreign body within the orbits.

## 2019-09-12 ENCOUNTER — Encounter: Payer: Self-pay | Admitting: Gastroenterology

## 2019-09-23 HISTORY — PX: COLONOSCOPY: SHX174

## 2019-10-06 ENCOUNTER — Other Ambulatory Visit: Payer: Self-pay

## 2019-10-06 ENCOUNTER — Ambulatory Visit (AMBULATORY_SURGERY_CENTER): Payer: Self-pay | Admitting: *Deleted

## 2019-10-06 VITALS — Temp 98.2°F | Ht 71.0 in | Wt 196.0 lb

## 2019-10-06 DIAGNOSIS — Z86010 Personal history of colonic polyps: Secondary | ICD-10-CM

## 2019-10-06 DIAGNOSIS — Z01818 Encounter for other preprocedural examination: Secondary | ICD-10-CM

## 2019-10-06 MED ORDER — NA SULFATE-K SULFATE-MG SULF 17.5-3.13-1.6 GM/177ML PO SOLN: ORAL | 0 refills | Status: DC

## 2019-10-06 NOTE — Progress Notes (Signed)
Patient is here in-person for PV. Patient denies any allergies to eggs or soy. Patient denies any problems with anesthesia/sedation. Post op nausea per pt. Patient denies any oxygen use at home. Patient denies taking any diet/weight loss medications or blood thinners. Patient is not being treated for MRSA or C-diff. EMMI education assisgned to the patient for the procedure, this was explained and instructions given to patient. COVID-19 screening test is on 1/19, the pt is aware. Pt is aware that care partner will wait in the car during procedure; if they feel like they will be too hot or cold to wait in the car; they may wait in the 4 th floor lobby. Patient is aware to bring only one care partner. We want them to wear a mask (we do not have any that we can provide them), practice social distancing, and we will check their temperatures when they get here.  I did remind the patient that their care partner needs to stay in the parking lot the entire time and have a cell phone available, we will call them when the pt is ready for discharge. Patient will wear mask into building.    Suprep $15 off coupon given to the patient.

## 2019-10-07 ENCOUNTER — Encounter: Payer: Self-pay | Admitting: Gastroenterology

## 2019-10-11 ENCOUNTER — Other Ambulatory Visit: Payer: Self-pay

## 2019-10-11 ENCOUNTER — Other Ambulatory Visit (HOSPITAL_COMMUNITY)
Admission: RE | Admit: 2019-10-11 | Discharge: 2019-10-11 | Disposition: A | Discharge status: Home / Self Care | Payer: 59 | Source: Ambulatory Visit | Attending: Gastroenterology | Admitting: Gastroenterology

## 2019-10-11 DIAGNOSIS — Z20822 Contact with and (suspected) exposure to covid-19: Secondary | ICD-10-CM | POA: Insufficient documentation

## 2019-10-11 DIAGNOSIS — Z01812 Encounter for preprocedural laboratory examination: Secondary | ICD-10-CM | POA: Insufficient documentation

## 2019-10-11 LAB — SARS CORONAVIRUS 2 (TAT 6-24 HRS): SARS Coronavirus 2: NEGATIVE

## 2019-10-14 ENCOUNTER — Encounter: Payer: Self-pay | Admitting: Gastroenterology

## 2019-10-14 ENCOUNTER — Other Ambulatory Visit: Payer: Self-pay

## 2019-10-14 ENCOUNTER — Ambulatory Visit (AMBULATORY_SURGERY_CENTER): Payer: 59 | Admitting: Gastroenterology

## 2019-10-14 VITALS — BP 99/72 | HR 61 | Temp 97.9°F | Resp 28

## 2019-10-14 DIAGNOSIS — D122 Benign neoplasm of ascending colon: Secondary | ICD-10-CM | POA: Diagnosis not present

## 2019-10-14 DIAGNOSIS — Z86010 Personal history of colonic polyps: Secondary | ICD-10-CM

## 2019-10-14 DIAGNOSIS — D120 Benign neoplasm of cecum: Secondary | ICD-10-CM | POA: Diagnosis not present

## 2019-10-14 DIAGNOSIS — D123 Benign neoplasm of transverse colon: Secondary | ICD-10-CM

## 2019-10-14 MED ORDER — SODIUM CHLORIDE 0.9 % IV SOLN: 500.0000 mL | Freq: Once | INTRAVENOUS | Status: DC

## 2019-10-14 NOTE — Op Note (Signed)
Bradley [name] Patient Name: Bradley [name] Procedure Date: 10/14/2019 8:02 AM MRN: [id number] Endoscopist: Mauri Pole , MD Age: 54 Referring MD:  Date of Birth: [birth year] Gender: Male Account #: [id number] Procedure:                Colonoscopy Indications:              High risk colon cancer surveillance: Personal                            history of multiple (3 or more) adenomas Medicines:                Monitored Anesthesia Care Procedure:                Pre-Anesthesia Assessment:                           - Prior to the procedure, a History and Physical                            was performed, and patient medications and                            allergies were reviewed. The patient's tolerance of                            previous anesthesia was also reviewed. The risks                            and benefits of the procedure and the sedation                            options and risks were discussed with the patient.                            All questions were answered, and informed consent                            was obtained. Prior Anticoagulants: The patient has                            taken no previous anticoagulant or antiplatelet                            agents. ASA Grade Assessment: II - A patient with                            mild systemic disease. After reviewing the risks                            and benefits, the patient was deemed in                            satisfactory condition to undergo the procedure.  After obtaining informed consent, the colonoscope                            was passed under direct vision. Throughout the                            procedure, the patient's blood pressure, pulse, and                            oxygen saturations were monitored continuously. The                            Colonoscope was introduced through the anus and                            advanced to the the  cecum, identified by                            appendiceal orifice and ileocecal valve. The                            colonoscopy was performed without difficulty. The                            patient tolerated the procedure well. The quality                            of the bowel preparation was excellent. The                            ileocecal valve, appendiceal orifice, and rectum                            were photographed. Scope In: 8:15:56 AM Scope Out: 8:40:01 AM Scope Withdrawal Time: 0 hours 12 minutes 32 seconds  Total Procedure Duration: 0 hours 24 minutes 5 seconds  Findings:                 The perianal and digital rectal examinations were                            normal.                           Three sessile polyps were found in the ascending                            colon and cecum. The polyps were 1 to 2 mm in size.                            These polyps were removed with a cold biopsy                            forceps. Resection and retrieval were complete.  A 5 mm polyp was found in the transverse colon. The                            polyp was semi-pedunculated. The polyp was removed                            with a cold snare. Resection and retrieval were                            complete.                           The exam was otherwise normal throughout the                            examined colon. Complications:            No immediate complications. Estimated Blood Loss:     Estimated blood loss was minimal. Impression:               - Three 1 to 2 mm polyps in the ascending colon and                            in the cecum, removed with a cold biopsy forceps.                            Resected and retrieved.                           - One 5 mm polyp in the transverse colon, removed                            with a cold snare. Resected and retrieved. Recommendation:           - Patient has a contact number available  for                            emergencies. The signs and symptoms of potential                            delayed complications were discussed with the                            patient. Return to normal activities tomorrow.                            Written discharge instructions were provided to the                            patient.                           - Resume previous diet.                           - Continue  present medications.                           - Await pathology results.                           - Repeat colonoscopy in 3 years for surveillance                            based on pathology results. Mauri Pole, MD 10/14/2019 8:46:34 AM This report has been signed electronically.

## 2019-10-14 NOTE — Progress Notes (Signed)
PT taken to PACU. Monitors in place. VSS. Report given to RN. 

## 2019-10-14 NOTE — Progress Notes (Signed)
Pt's states no medical or surgical changes since previsit or office visit.  Vitals- Bradley [name]- June

## 2019-10-14 NOTE — Patient Instructions (Signed)
Thank you for letting us take care of your healthcare needs today. See handout given to you on Polyps.    YOU HAD AN ENDOSCOPIC PROCEDURE TODAY AT [facility]:   Refer to the procedure report that was given to you for any specific questions about what was found during the examination.  If the procedure report does not answer your questions, please call your gastroenterologist to clarify.  If you requested that your care partner not be given the details of your procedure findings, then the procedure report has been included in a sealed envelope for you to review at your convenience later.  YOU SHOULD EXPECT: Some feelings of bloating in the abdomen. Passage of more gas than usual.  Walking can help get rid of the air that was put into your GI tract during the procedure and reduce the bloating. If you had a lower endoscopy (such as a colonoscopy or flexible sigmoidoscopy) you may notice spotting of blood in your stool or on the toilet paper. If you underwent a bowel prep for your procedure, you may not have a normal bowel movement for a few days.  Please Note:  You might notice some irritation and congestion in your nose or some drainage.  This is from the oxygen used during your procedure.  There is no need for concern and it should clear up in a day or so.  SYMPTOMS TO REPORT IMMEDIATELY:   Following lower endoscopy (colonoscopy or flexible sigmoidoscopy):  Excessive amounts of blood in the stool  Significant tenderness or worsening of abdominal pains  Swelling of the abdomen that is new, acute  Fever of 100F or higher   For urgent or emergent issues, a gastroenterologist can be reached at any hour by calling [phone].   DIET:  We do recommend a small meal at first, but then you may proceed to your regular diet.  Drink plenty of fluids but you should avoid alcoholic beverages for 24 hours.  ACTIVITY:  You should plan to take it easy for the rest of today and you  should NOT DRIVE or use heavy machinery until tomorrow (because of the sedation medicines used during the test).    FOLLOW UP: Our staff will call the number listed on your records 48-72 hours following your procedure to check on you and address any questions or concerns that you may have regarding the information given to you following your procedure. If we do not reach you, we will leave a message.  We will attempt to reach you two times.  During this call, we will ask if you have developed any symptoms of COVID 19. If you develop any symptoms (ie: fever, flu-like symptoms, shortness of breath, cough etc.) before then, please call [phone].  If you test positive for Covid 19 in the 2 weeks post procedure, please call and report this information to [place].    If any biopsies were taken you will be contacted by phone or by letter within the next 1-3 weeks.  Please call us at [phone] if you have not heard about the biopsies in 3 weeks.    SIGNATURES/CONFIDENTIALITY: You and/or your care partner have signed paperwork which will be entered into your electronic medical record.  These signatures attest to the fact that that the information above on your After Visit Summary has been reviewed and is understood.  Full responsibility of the confidentiality of this discharge information lies with you and/or your care-partner.

## 2019-10-18 ENCOUNTER — Telehealth: Payer: Self-pay

## 2019-10-18 NOTE — Telephone Encounter (Signed)
  Follow up Call-  Call back number 10/14/2019  Post procedure Call Back phone  # [phone]  Permission to leave phone message Yes  Some recent data might be hidden     Left message

## 2019-10-18 NOTE — Telephone Encounter (Signed)
  Follow up Call-  Call back number 10/14/2019  Post procedure Call Back phone  # [phone]  Permission to leave phone message Yes  Some recent data might be hidden     Patient questions:  Do you have a fever, pain , or abdominal swelling? No. Pain Score  0 *  Have you tolerated food without any problems? Yes.    Have you been able to return to your normal activities? Yes.    Do you have any questions about your discharge instructions: Diet   No. Medications  No. Follow up visit  No.  Do you have questions or concerns about your Care? No.  Actions: * If pain score is 4 or above: No action needed, pain <4.    1. Have you developed a fever since your procedure? No  2.   Have you had an respiratory symptoms (SOB or cough) since your procedure? No  3.   Have you tested positive for COVID 19 since your procedure? No  4.   Have you had any family members/close contacts diagnosed with the COVID 19 since your procedure?  No   If yes to any of these questions please route to Joylene John, RN and Alphonsa Gin, RN.

## 2019-10-26 ENCOUNTER — Encounter: Payer: Self-pay | Admitting: Gastroenterology

## 2019-11-15 ENCOUNTER — Other Ambulatory Visit: Payer: Self-pay | Admitting: Nurse Practitioner

## 2019-11-15 ENCOUNTER — Other Ambulatory Visit: Payer: Self-pay

## 2019-11-15 ENCOUNTER — Ambulatory Visit
Admission: EM | Admit: 2019-11-15 | Discharge: 2019-11-15 | Disposition: A | Discharge status: Home / Self Care | Payer: 59 | Attending: Emergency Medicine | Admitting: Emergency Medicine

## 2019-11-15 DIAGNOSIS — U071 COVID-19: Secondary | ICD-10-CM

## 2019-11-15 DIAGNOSIS — Z20822 Contact with and (suspected) exposure to covid-19: Secondary | ICD-10-CM | POA: Diagnosis not present

## 2019-11-15 DIAGNOSIS — I10 Essential (primary) hypertension: Secondary | ICD-10-CM

## 2019-11-15 LAB — POC SARS CORONAVIRUS 2 AG -  ED: SARS Coronavirus 2 Ag: POSITIVE — AB

## 2019-11-15 MED ORDER — PREDNISONE 10 MG PO TABS: 20.0000 mg | ORAL_TABLET | Freq: Every day | ORAL | 0 refills | Status: DC

## 2019-11-15 MED ORDER — FLUTICASONE PROPIONATE 50 MCG/ACT NA SUSP: 1.0000 | Freq: Every day | NASAL | 0 refills | Status: DC

## 2019-11-15 MED ORDER — BENZONATATE 100 MG PO CAPS: 100.0000 mg | ORAL_CAPSULE | Freq: Three times a day (TID) | ORAL | 0 refills | Status: DC

## 2019-11-15 NOTE — Discharge Instructions (Addendum)
COVID test was positive  You should remain isolated in your home for 10 days from symptom onset AND greater than 72 hours after symptoms resolution (absence of fever without the use of fever-reducing medication and improvement in respiratory symptoms), whichever is longer Get plenty of rest and push fluids Prednisone was prescribed Tessalon Perles was prescribed Use flonase for nasal congestion and runny nose Use medications daily for symptom relief Use OTC medications like ibuprofen or tylenol as needed fever or pain Follow up with PCP in 1-2 days via phone or e-visit for recheck and to ensure symptoms are improving Call or go to the ED if you have any new or worsening symptoms such as fever, worsening cough, shortness of breath, chest tightness, chest pain, turning blue, changes in mental status, etc..Marland Kitchen

## 2019-11-15 NOTE — ED Provider Notes (Signed)
[facility]    CSN: [id number] Arrival date & time: 11/15/19  1252      History   Chief Complaint Chief Complaint  Patient presents with  . cough, loss of taste    HPI ANTOHNY [name] is a 54 y.o. male.   .  Will present to the urgent care for complaint of sneezing, rhinorrhea, cough, loss of taste for the past 1 day.  Denies sick exposure to COVID, flu or strep.  Denies recent travel.  Denies aggravating or alleviating symptoms.  Denies previous COVID infection.   Denies fever, chills, fatigue, nasal congestion,  sore throat,  SOB, wheezing, chest pain, nausea, vomiting, changes in bowel or bladder habits.    The history is provided by the patient. No language interpreter was used.    Past Medical History:  Diagnosis Date  . Allergy   . Blood transfusion without reported diagnosis   . Cough variant asthma 10/12/2007   no per pt 06-19-16  . Elevated LFTs   . Esophageal reflux   . GERD (gastroesophageal reflux disease)   . Herniated nucleus pulposus, L3-4 left 06/20/2019  . Hiatal hernia   . Hyperlipidemia   . Irritable bowel syndrome   . LATERAL EPICONDYLITIS 03/07/2010   Qualifier: Diagnosis of  By: Elease Hashimoto MD, Bruce  left shoulder arthrits per pt 06-19-16  . Liver lesion   . Lumbar radiculopathy 06/20/2019  . Lung mass   . Personal history of colonic polyps 09/28/2009    ADENOMATOUS POLYP  . PONV (postoperative nausea and vomiting)   . Schatzki's ring   . Vitamin B 12 deficiency     Patient Active Problem List   Diagnosis Date Noted  . Lumbar radiculopathy 06/20/2019  . Herniated nucleus pulposus, L3-4 left 06/20/2019  . Hemangioma of liver 11/24/2017  . Small bowel obstruction ([place]) 11/18/2017  . Pulmonary nodules 06/03/2016  . SBO (small bowel obstruction) ([place]) 04/11/2016  . Hypokalemia 04/11/2016  . Total bilirubin, elevated 04/11/2016  . Insomnia 05/10/2015  . Gallstones 05/10/2015  . Essential hypertension 05/10/2015  . History of colonic polyps  10/03/2010  . B12 deficiency 09/05/2009  . IBS 08/31/2009  . ALLERGIC RHINITIS, SEASONAL 07/24/2009  . External hemorrhoids 06/07/2008  . Hyperlipidemia 05/12/2007  . GERD 05/12/2007    Past Surgical History:  Procedure Laterality Date  . BACK SURGERY  07/12/2019   Dr.Stern  . COLONOSCOPY  07/03/2016  . Exploration Laparotomy and Small Bowel resection    . LEG SURGERY     left  . LUMBAR LAMINECTOMY     x3 last one June 2017  . microdisctomy  07/12/2019   L3-L4  . POLYPECTOMY    . UPPER GASTROINTESTINAL ENDOSCOPY  07/03/2016  . VENTRAL HERNIA REPAIR    . VIDEO BRONCHOSCOPY Bilateral 06/10/2016   Procedure: VIDEO BRONCHOSCOPY WITH FLUORO;  Surgeon: Marshell Garfinkel, MD;  Location: [place];  Service: Cardiopulmonary;  Laterality: Bilateral;  . VIDEO BRONCHOSCOPY WITH ENDOBRONCHIAL ULTRASOUND N/A 09/08/2016   Procedure: VIDEO BRONCHOSCOPY WITH ENDOBRONCHIAL ULTRASOUND;  Surgeon: Marshell Garfinkel, MD;  Location: [place];  Service: Pulmonary;  Laterality: N/A;       Home Medications    Prior to Admission medications   Medication Sig Start Date End Date Taking? Authorizing Provider  atorvastatin (LIPITOR) 10 MG tablet Take 1 tablet (10 mg total) by mouth daily. 02/10/19   Marin Olp, MD  benzonatate (TESSALON) 100 MG capsule Take 1 capsule (100 mg total) by mouth every 8 (eight) hours. 11/15/19  Shereda Graw, Darrelyn Hillock, FNP  cyanocobalamin (,VITAMIN B-12,) 1000 MCG/ML injection INJECT 1 ML (1,000 MCG TOTAL) INTO THE SKIN EVERY 30 (THIRTY) DAYS. 04/04/19   Marin Olp, MD  Dexlansoprazole 30 MG capsule Take 1 capsule (30 mg total) by mouth daily. 02/10/19   Marin Olp, MD  fluticasone (FLONASE) 50 MCG/ACT nasal spray Place 1 spray into both nostrils daily for 14 days. 11/15/19 11/29/19  Charlie Char, Darrelyn Hillock, FNP  gabapentin (NEURONTIN) 300 MG capsule PLEASE SEE ATTACHED FOR DETAILED DIRECTIONS 12/22/17   [provider]  ibuprofen (ADVIL) 800 MG tablet Take 800 mg by  mouth every 6 (six) hours as needed.  08/13/19   [provider]  predniSONE (DELTASONE) 10 MG tablet Take 2 tablets (20 mg total) by mouth daily. 11/15/19   Adan Beal, Darrelyn Hillock, FNP  bisoprolol-hydrochlorothiazide (ZIAC) 2.5-6.25 MG per tablet Take 1 tablet by mouth daily. 10/28/11 11/28/11  Ricard Dillon, MD    Family History Family History  Problem Relation Age of Onset  . Breast cancer Mother   . Thyroid cancer Mother   . Healthy Father   . Colon cancer Neg Hx   . Esophageal cancer Neg Hx   . Rectal cancer Neg Hx   . Stomach cancer Neg Hx   . Colon polyps Neg Hx     Social History Social History   Tobacco Use  . Smoking status: Never Smoker  . Smokeless tobacco: Never Used  Substance Use Topics  . Alcohol use: Yes    Comment: 3-4 times per month per pt  . Drug use: No     Allergies   No known allergies   Review of Systems Review of Systems  Constitutional: Negative.   HENT: Positive for rhinorrhea.   Respiratory: Positive for cough.   Cardiovascular: Negative.   Gastrointestinal: Negative.   Neurological: Negative.        Loss of taste     Physical Exam Triage Vital Signs ED Triage Vitals  Enc Vitals Group     BP 11/15/19 1257 (!) 148/98     Pulse Rate 11/15/19 1257 (!) 104     Resp 11/15/19 1257 16     Temp 11/15/19 1257 98.8 F (37.1 C)     Temp Source 11/15/19 1257 Oral     SpO2 11/15/19 1257 96 %     Weight --      Height --      Head Circumference --      Peak Flow --      Pain Score 11/15/19 1300 0     Pain Loc --      Pain Edu? --      Excl. in [place]? --    No data found.  Updated Vital Signs BP (!) 148/98 (BP Location: Right Arm)   Pulse (!) 104   Temp 98.8 F (37.1 C) (Oral)   Resp 16   SpO2 96%   Visual Acuity Right Eye Distance:   Left Eye Distance:   Bilateral Distance:    Right Eye Near:   Left Eye Near:    Bilateral Near:     Physical Exam Vitals and nursing note reviewed.  Constitutional:      General: He is  not in acute distress.    Appearance: Normal appearance. He is normal weight. He is not ill-appearing or toxic-appearing.  HENT:     Head: Normocephalic.     Right Ear: Tympanic membrane, ear canal and external ear normal. There is no  impacted cerumen.     Left Ear: Tympanic membrane, ear canal and external ear normal. There is no impacted cerumen.     Nose: Nose normal. No congestion.  Cardiovascular:     Rate and Rhythm: Normal rate and regular rhythm.     Pulses: Normal pulses.     Heart sounds: Normal heart sounds. No murmur.  Pulmonary:     Effort: Pulmonary effort is normal. No respiratory distress.     Breath sounds: Normal breath sounds. No wheezing or rhonchi.  Chest:     Chest wall: No tenderness.  Neurological:     Mental Status: He is alert and oriented to person, place, and time.      UC Treatments / Results  Labs (all labs ordered are listed, but only abnormal results are displayed) Labs Reviewed  POC SARS CORONAVIRUS 2 AG -  ED - Abnormal; Notable for the following components:      Result Value   SARS Coronavirus 2 Ag Positive (*)    All other components within normal limits    EKG   Radiology No results found.  Procedures Procedures (including critical care time)  Medications Ordered in UC Medications - No data to display  Initial Impression / Assessment and Plan / UC Course  I have reviewed the triage vital signs and the nursing notes.  Pertinent labs & imaging results that were available during my care of the patient were reviewed by me and considered in my medical decision making (see chart for details).   POCT COVID-19 test was ordered.  Results positive for COVID-19 infection Advised patient to quarantine To go to ED for worsening of symptoms Flonase was prescribed Tessalon Perles prescribed Short-term prednisone was prescribed Go to ED for worsening of symptoms    Final Clinical Impressions(s) / UC Diagnoses   Final diagnoses:   COVID-19 virus infection     Discharge Instructions     COVID test was positive  You should remain isolated in your home for 10 days from symptom onset AND greater than 72 hours after symptoms resolution (absence of fever without the use of fever-reducing medication and improvement in respiratory symptoms), whichever is longer Get plenty of rest and push fluids Prednisone was prescribed Tessalon Perles was prescribed Use flonase for nasal congestion and runny nose Use medications daily for symptom relief Use OTC medications like ibuprofen or tylenol as needed fever or pain Follow up with PCP in 1-2 days via phone or e-visit for recheck and to ensure symptoms are improving Call or go to the ED if you have any new or worsening symptoms such as fever, worsening cough, shortness of breath, chest tightness, chest pain, turning blue, changes in mental status, etc...      ED Prescriptions    Medication Sig Dispense Auth. Provider   fluticasone (FLONASE) 50 MCG/ACT nasal spray Place 1 spray into both nostrils daily for 14 days. 16 g Noe Goyer S, FNP   benzonatate (TESSALON) 100 MG capsule Take 1 capsule (100 mg total) by mouth every 8 (eight) hours. 21 capsule Tiffony Kite S, FNP   predniSONE (DELTASONE) 10 MG tablet Take 2 tablets (20 mg total) by mouth daily. 15 tablet Bodhi Stenglein, Darrelyn Hillock, FNP     PDMP not reviewed this encounter.   Emerson Monte, FNP 11/15/19 1350

## 2019-11-15 NOTE — Progress Notes (Signed)
  I connected by phone with Bradley [name] on 11/15/2019 at 2:22 PM to discuss the potential use of an new treatment for mild to moderate COVID-19 viral infection in non-hospitalized patients.  This patient is a 54 y.o. male that meets the FDA criteria for Emergency Use Authorization of bamlanivimab or casirivimab\imdevimab.  Has a (+) direct SARS-CoV-2 viral test result  Has mild or moderate COVID-19   Is ? 54 years of age and weighs ? 40 kg  Is NOT hospitalized due to COVID-19  Is NOT requiring oxygen therapy or requiring an increase in baseline oxygen flow rate due to COVID-19  Is within 10 days of symptom onset  Has at least one of the high risk factor(s) for progression to severe COVID-19 and/or hospitalization as defined in EUA.  Specific high risk criteria : Hypertension   I have spoken and communicated the following to the patient or parent/caregiver:  1. FDA has authorized the emergency use of bamlanivimab and casirivimab\imdevimab for the treatment of mild to moderate COVID-19 in adults and pediatric patients with positive results of direct SARS-CoV-2 viral testing who are 37 years of age and older weighing at least 40 kg, and who are at high risk for progressing to severe COVID-19 and/or hospitalization.  2. The significant known and potential risks and benefits of bamlanivimab and casirivimab\imdevimab, and the extent to which such potential risks and benefits are unknown.  3. Information on available alternative treatments and the risks and benefits of those alternatives, including clinical trials.  4. Patients treated with bamlanivimab and casirivimab\imdevimab should continue to self-isolate and use infection control measures (e.g., wear mask, isolate, social distance, avoid sharing personal items, clean and disinfect "high touch" surfaces, and frequent handwashing) according to CDC guidelines.   5. The patient or parent/caregiver has the option to accept or refuse  bamlanivimab or casirivimab\imdevimab .  After reviewing this information with the patient, The patient agreed to proceed with receiving the bamlanimivab infusion and will be provided a copy of the Fact sheet prior to receiving the infusion.[name] 11/15/2019 2:22 PM

## 2019-11-15 NOTE — ED Triage Notes (Signed)
Pt presents to UC w/ c/o sneezing, nasal drainage, cough, loss of taste since yesterday.

## 2019-11-16 ENCOUNTER — Ambulatory Visit (HOSPITAL_COMMUNITY)
Admission: RE | Admit: 2019-11-16 | Discharge: 2019-11-16 | Disposition: A | Discharge status: Home / Self Care | Payer: 59 | Source: Ambulatory Visit | Attending: Pulmonary Disease | Admitting: Pulmonary Disease

## 2019-11-16 DIAGNOSIS — U071 COVID-19: Secondary | ICD-10-CM | POA: Diagnosis present

## 2019-11-16 DIAGNOSIS — I10 Essential (primary) hypertension: Secondary | ICD-10-CM | POA: Insufficient documentation

## 2019-11-16 MED ORDER — EPINEPHRINE 0.3 MG/0.3ML IJ SOAJ: 0.3000 mg | Freq: Once | INTRAMUSCULAR | Status: DC | PRN

## 2019-11-16 MED ORDER — SODIUM CHLORIDE 0.9 % IV SOLN
700.0000 mg | Freq: Once | INTRAVENOUS | Status: AC
  Administered 2019-11-16: 09:00:00 700 mg via INTRAVENOUS
  Filled 2019-11-16: qty 20

## 2019-11-16 MED ORDER — FAMOTIDINE IN NACL 20-0.9 MG/50ML-% IV SOLN: 20.0000 mg | Freq: Once | INTRAVENOUS | Status: DC | PRN

## 2019-11-16 MED ORDER — SODIUM CHLORIDE 0.9 % IV SOLN
INTRAVENOUS | Status: DC | PRN
  Administered 2019-11-16: 250 mL via INTRAVENOUS

## 2019-11-16 MED ORDER — ALBUTEROL SULFATE HFA 108 (90 BASE) MCG/ACT IN AERS: 2.0000 | INHALATION_SPRAY | Freq: Once | RESPIRATORY_TRACT | Status: DC | PRN

## 2019-11-16 MED ORDER — METHYLPREDNISOLONE SODIUM SUCC 125 MG IJ SOLR: 125.0000 mg | Freq: Once | INTRAMUSCULAR | Status: DC | PRN

## 2019-11-16 MED ORDER — DIPHENHYDRAMINE HCL 50 MG/ML IJ SOLN: 50.0000 mg | Freq: Once | INTRAMUSCULAR | Status: DC | PRN

## 2019-11-16 NOTE — Discharge Instructions (Signed)

## 2019-11-16 NOTE — Progress Notes (Signed)
  Diagnosis: COVID-19  Physician: Dr. Wright  Procedure: Covid Infusion Clinic Med: bamlanivimab infusion - Provided patient with bamlanimivab fact sheet for patients, parents and caregivers prior to infusion.  Complications: No immediate complications noted.  Discharge: Discharged home   Jeffrey Voth A 11/16/2019  

## 2019-11-21 ENCOUNTER — Encounter: Payer: Self-pay | Admitting: Family Medicine

## 2019-11-21 NOTE — Telephone Encounter (Signed)
Can you call and see if we can set up for respiratory clinic.

## 2019-11-21 NOTE — Telephone Encounter (Signed)
Called and LVM to schedule appt at respiratory clinic

## 2019-11-29 ENCOUNTER — Other Ambulatory Visit: Payer: Self-pay | Admitting: Family Medicine

## 2020-01-03 ENCOUNTER — Encounter: Payer: Self-pay | Admitting: Family Medicine

## 2020-01-03 ENCOUNTER — Telehealth: Payer: Self-pay | Admitting: Family Medicine

## 2020-01-03 NOTE — Telephone Encounter (Signed)
Patient spoke with [facility] 01/03/20 at 11:53am stating he has been having back pain and numbness.  Thinks it's his L4.  Hx of degenerative disk and had had L4 surgery for leg in oct 2020 for numbness and tingling.  Would like prednisone and neuro referral.  Numbness and tingling started 4-6 weeks ago.  The longer he is on his feet the worse it gets.   Fayne Mediate RN advised patient to see PCP within 3 days.   Patient is scheduled for 01/06/20 with Morene Rankins.

## 2020-01-03 NOTE — Telephone Encounter (Signed)
Noted  

## 2020-01-04 ENCOUNTER — Telehealth: Payer: Self-pay | Admitting: *Deleted

## 2020-01-04 NOTE — Telephone Encounter (Signed)
Left message on voicemail to call office. See if pt can come in today for appt at 2:00 PM on hold for him per [facility].

## 2020-01-06 ENCOUNTER — Ambulatory Visit (INDEPENDENT_AMBULATORY_CARE_PROVIDER_SITE_OTHER): Payer: 59 | Admitting: Physician Assistant

## 2020-01-06 ENCOUNTER — Other Ambulatory Visit: Payer: Self-pay

## 2020-01-06 ENCOUNTER — Encounter: Payer: Self-pay | Admitting: Physician Assistant

## 2020-01-06 VITALS — BP 164/100 | HR 64 | Temp 97.8°F | Ht 71.0 in | Wt 198.0 lb

## 2020-01-06 DIAGNOSIS — M5416 Radiculopathy, lumbar region: Secondary | ICD-10-CM | POA: Diagnosis not present

## 2020-01-06 DIAGNOSIS — I10 Essential (primary) hypertension: Secondary | ICD-10-CM | POA: Diagnosis not present

## 2020-01-06 MED ORDER — AMLODIPINE BESYLATE 2.5 MG PO TABS: 2.5000 mg | ORAL_TABLET | Freq: Every day | ORAL | 0 refills | Status: DC

## 2020-01-06 MED ORDER — PREDNISONE 20 MG PO TABS: 40.0000 mg | ORAL_TABLET | Freq: Every day | ORAL | 0 refills | Status: DC

## 2020-01-06 NOTE — Progress Notes (Signed)
Bradley [name] is a 54 y.o. male here for a new problem.  I acted as a Education administrator for [employer], [place], [place]  History of Present Illness:   Chief Complaint  Patient presents with  . Back Pain  . Numbness    HPI   Back pain & numbness in leg Left leg numbness. Pain increasing. Patient states he did something last week which increased the pain. He took Ibuprofen and Gabapentin which provided minor relief. Takes gabapentin 300 mg nightly. Pain is a 6/10 today.  Patient has taken oral prednisone in the past and that typically relieves his symptoms. Would like refill today.  He states that he has seen his neurosurgeon in the past and he typically recommends only surgical options.  He would like to see a neurologist to see if there are other options that he has.  He denies loss of bowel or bladder function, weakness, severe unusual pain.   HTN Currently not on any medications.  He was on amlodipine 2.5 mg previously but he had lost weight and was able to come off of this.  He did have Covid and since has had some weight gain which has caused his blood pressure to become uncontrolled again. Patient denies chest pain, SOB, blurred vision, dizziness, unusual headaches, lower leg swelling. Patient is  compliant with medication. Denies excessive caffeine intake, stimulant usage, excessive alcohol intake, or increase in salt consumption.  BP Readings from Last 3 Encounters:  01/06/20 (!) 164/100  11/16/19 (!) 150/94  11/15/19 (!) 148/98   Wt Readings from Last 4 Encounters:  01/06/20 198 lb (89.8 kg)  10/06/19 196 lb (88.9 kg)  08/17/19 191 lb 6.4 oz (86.8 kg)  02/10/19 179 lb 9.6 oz (81.5 kg)     Past Medical History:  Diagnosis Date  . Allergy   . Blood transfusion without reported diagnosis   . Cough variant asthma 10/12/2007   no per pt 06-19-16  . Elevated LFTs   . Esophageal reflux   . GERD (gastroesophageal reflux disease)   . Herniated nucleus pulposus, L3-4  left 06/20/2019  . Hiatal hernia   . Hyperlipidemia   . Irritable bowel syndrome   . LATERAL EPICONDYLITIS 03/07/2010   Qualifier: Diagnosis of  By: Elease Hashimoto MD, Bruce  left shoulder arthrits per pt 06-19-16  . Liver lesion   . Lumbar radiculopathy 06/20/2019  . Lung mass   . Personal history of colonic polyps 09/28/2009    ADENOMATOUS POLYP  . PONV (postoperative nausea and vomiting)   . Schatzki's ring   . Vitamin B 12 deficiency      Social History   Socioeconomic History  . Marital status: Married    Spouse name: Not on file  . Number of children: 2  . Years of education: Not on file  . Highest education level: Not on file  Occupational History  . Occupation: ELECTRICIAN    Employer: [employer]  Tobacco Use  . Smoking status: Never Smoker  . Smokeless tobacco: Never Used  Substance and Sexual Activity  . Alcohol use: Yes    Comment: 3-4 times per month per pt  . Drug use: No  . Sexual activity: Not on file  Other Topics Concern  . Not on file  Social History Narrative   Married, lives with wife and son   OCCUPATION: Physiological scientist    Social Determinants of Health   Financial Resource Strain:   . Difficulty of Paying Living Expenses:   Food Insecurity:   .  Worried About Charity fundraiser in the Last Year:   . Arboriculturist in the Last Year:   Transportation Needs:   . Film/video editor (Medical):   Marland Kitchen Lack of Transportation (Non-Medical):   Physical Activity:   . Days of Exercise per Week:   . Minutes of Exercise per Session:   Stress:   . Feeling of Stress :   Social Connections:   . Frequency of Communication with Friends and Family:   . Frequency of Social Gatherings with Friends and Family:   . Attends Religious Services:   . Active Member of Clubs or Organizations:   . Attends Archivist Meetings:   Marland Kitchen Marital Status:   Intimate Partner Violence:   . Fear of Current or Ex-Partner:   . Emotionally Abused:   Marland Kitchen Physically Abused:    . Sexually Abused:     Past Surgical History:  Procedure Laterality Date  . BACK SURGERY  07/12/2019   Dr.Stern  . COLONOSCOPY  07/03/2016  . Exploration Laparotomy and Small Bowel resection    . LEG SURGERY     left  . LUMBAR LAMINECTOMY     x3 last one June 2017  . microdisctomy  07/12/2019   L3-L4  . POLYPECTOMY    . UPPER GASTROINTESTINAL ENDOSCOPY  07/03/2016  . VENTRAL HERNIA REPAIR    . VIDEO BRONCHOSCOPY Bilateral 06/10/2016   Procedure: VIDEO BRONCHOSCOPY WITH FLUORO;  Surgeon: Marshell Garfinkel, MD;  Location: [place];  Service: Cardiopulmonary;  Laterality: Bilateral;  . VIDEO BRONCHOSCOPY WITH ENDOBRONCHIAL ULTRASOUND N/A 09/08/2016   Procedure: VIDEO BRONCHOSCOPY WITH ENDOBRONCHIAL ULTRASOUND;  Surgeon: Marshell Garfinkel, MD;  Location: [place];  Service: Pulmonary;  Laterality: N/A;    Family History  Problem Relation Age of Onset  . Breast cancer Mother   . Thyroid cancer Mother   . Healthy Father   . Colon cancer Neg Hx   . Esophageal cancer Neg Hx   . Rectal cancer Neg Hx   . Stomach cancer Neg Hx   . Colon polyps Neg Hx     Allergies  Allergen Reactions  . No Known Allergies     Current Medications:   Current Outpatient Medications:  .  atorvastatin (LIPITOR) 10 MG tablet, Take 1 tablet (10 mg total) by mouth daily., Disp: 90 tablet, Rfl: 3 .  cyanocobalamin (,VITAMIN B-12,) 1000 MCG/ML injection, INJECT 1 ML (1,000 MCG TOTAL) INTO THE SKIN EVERY 30 (THIRTY) DAYS., Disp: 3 mL, Rfl: 1 .  Dexlansoprazole 30 MG capsule, Take 1 capsule (30 mg total) by mouth daily., Disp: 90 capsule, Rfl: 3 .  gabapentin (NEURONTIN) 300 MG capsule, PLEASE SEE ATTACHED FOR DETAILED DIRECTIONS, Disp: , Rfl: 0 .  ibuprofen (ADVIL) 800 MG tablet, Take 800 mg by mouth every 6 (six) hours as needed. , Disp: , Rfl:  .  amLODipine (NORVASC) 2.5 MG tablet, Take 1 tablet (2.5 mg total) by mouth daily., Disp: 90 tablet, Rfl: 0 .  benzonatate (TESSALON) 100 MG capsule, Take 1 capsule  (100 mg total) by mouth every 8 (eight) hours. (Patient not taking: Reported on 01/06/2020), Disp: 21 capsule, Rfl: 0 .  fluticasone (FLONASE) 50 MCG/ACT nasal spray, Place 1 spray into both nostrils daily for 14 days. (Patient not taking: Reported on 01/06/2020), Disp: 16 g, Rfl: 0 .  predniSONE (DELTASONE) 20 MG tablet, Take 2 tablets (40 mg total) by mouth daily., Disp: 10 tablet, Rfl: 0   Review of Systems:   ROS  Negative unless otherwise specified per HPI.  Vitals:   Vitals:   01/06/20 1049  BP: (!) 164/100  Pulse: 64  Temp: 97.8 F (36.6 C)  TempSrc: Temporal  SpO2: 96%  Weight: 198 lb (89.8 kg)  Height: 5\' 11"  (1.803 m)     Body mass index is 27.62 kg/m.  Physical Exam:   Physical Exam Vitals and nursing note reviewed.  Constitutional:      General: He is not in acute distress.    Appearance: He is well-developed. He is not ill-appearing or toxic-appearing.  Cardiovascular:     Rate and Rhythm: Normal rate and regular rhythm.     Pulses: Normal pulses.     Heart sounds: Normal heart sounds, S1 normal and S2 normal.     Comments: No LE edema Pulmonary:     Effort: Pulmonary effort is normal.     Breath sounds: Normal breath sounds.  Musculoskeletal:     Comments: Decreased ROM 2/2 pain with flexion/extension, lateral side bends, and rotation. Slight bony tenderness to middle lumbar at prior scar. No evidence of erythema, rash or ecchymosis.   Skin:    General: Skin is warm and dry.  Neurological:     Mental Status: He is alert.     GCS: GCS eye subscore is 4. GCS verbal subscore is 5. GCS motor subscore is 6.     Comments: Decreased perception to L outer thigh  Psychiatric:        Speech: Speech normal.        Behavior: Behavior normal. Behavior is cooperative.      Assessment and Plan:   Drayke was seen today for back pain and numbness.  Diagnoses and all orders for this visit:  Lumbar radiculopathy No red flags on discussion.  Patient is aware of  red flags and what would warrant an ER visit.  I am going to give him a prescription of prednisone and put in a referral for neurology per his request.  Low threshold to go to ER if he has any worsening symptoms in the interim.  Essential hypertension Uncontrolled.  We are going to resume his Norvasc 2.5 mg daily.  I suspect that as he is able to reduce stress and his weight he may be able to come off of this again.  Follow-up with PCP as already scheduled for next month.  Other orders -     amLODipine (NORVASC) 2.5 MG tablet; Take 1 tablet (2.5 mg total) by mouth daily. -     predniSONE (DELTASONE) 20 MG tablet; Take 2 tablets (40 mg total) by mouth daily.  . Reviewed expectations re: course of current medical issues. . Discussed self-management of symptoms. . Outlined signs and symptoms indicating need for more acute intervention. . Patient verbalized understanding and all questions were answered. . See orders for this visit as documented in the electronic medical record. . Patient received an After-Visit Summary.  CMA or LPN served as scribe during this visit. History, Physical, and Plan performed by medical provider. The above documentation has been reviewed and is accurate and complete.   Inda Coke, PA-C

## 2020-01-06 NOTE — Patient Instructions (Signed)
It was great to see you!  Resume 2.5 mg norvasc daily for your blood pressure.  Start prednisone for your back pain.  I will put in the referral for the neurologist.  If you develop worsening back pain that is unbearable, control of legs, or loss of bowel/bladder function please go to the ER.  Take care,  Bradley Coke PA-C

## 2020-02-15 ENCOUNTER — Ambulatory Visit: Payer: 59 | Admitting: Family Medicine

## 2020-03-21 ENCOUNTER — Other Ambulatory Visit: Payer: Self-pay | Admitting: Family Medicine

## 2020-03-29 ENCOUNTER — Other Ambulatory Visit: Payer: Self-pay | Admitting: Physician Assistant

## 2020-04-26 ENCOUNTER — Ambulatory Visit: Payer: 59 | Attending: Internal Medicine

## 2020-04-26 DIAGNOSIS — Z23 Encounter for immunization: Secondary | ICD-10-CM

## 2020-04-26 NOTE — Progress Notes (Signed)
   Covid-19 Vaccination Clinic  Name:  Bradley [name]    MRN: [id number] DOB: 1966-07-08  04/26/2020 . [name] was observed post Covid-19 immunization for 15 minutes without incident. He was provided with Vaccine Information Sheet and instruction to access the V-Safe system.   [name] was instructed to call 911 with any severe reactions post vaccine: Marland Kitchen Difficulty breathing  . Swelling of face and throat  . A fast heartbeat  . A bad rash all over body  . Dizziness and weakness   Immunizations Administered    Name Date Dose VIS Date Route   Moderna COVID-19 Vaccine 04/26/2020  4:26 PM 0.5 mL 08/2019 Intramuscular   Manufacturer: Moderna   Lot: [id number]   [place]: 21117-356-70

## 2020-05-17 ENCOUNTER — Ambulatory Visit: Payer: 59 | Attending: Internal Medicine

## 2020-05-17 DIAGNOSIS — Z23 Encounter for immunization: Secondary | ICD-10-CM

## 2020-05-17 NOTE — Progress Notes (Signed)
   Covid-19 Vaccination Clinic  Name:  Bradley [name]    MRN: [id number] DOB: 1966-07-25  05/17/2020  Bradley [name] was observed post Covid-19 immunization for 15 minutes without incident. He was provided with Vaccine Information Sheet and instruction to access the V-Safe system.   Bradley [name] was instructed to call 911 with any severe reactions post vaccine: Marland Kitchen Difficulty breathing  . Swelling of face and throat  . A fast heartbeat  . A bad rash all over body  . Dizziness and weakness   Immunizations Administered    Name Date Dose VIS Date Route   Pfizer COVID-19 Vaccine 05/17/2020  3:13 PM 0.3 mL 11/16/2018 Intramuscular   Manufacturer: [place]   Lot: [id number]   [place]: [id number]

## 2020-06-29 ENCOUNTER — Encounter: Payer: Self-pay | Admitting: Family Medicine

## 2020-06-29 ENCOUNTER — Other Ambulatory Visit: Payer: Self-pay

## 2020-06-29 DIAGNOSIS — D229 Melanocytic nevi, unspecified: Secondary | ICD-10-CM

## 2020-07-09 ENCOUNTER — Other Ambulatory Visit: Payer: Self-pay | Admitting: Neurosurgery

## 2020-07-09 DIAGNOSIS — M5416 Radiculopathy, lumbar region: Secondary | ICD-10-CM

## 2020-08-03 ENCOUNTER — Other Ambulatory Visit: Payer: Self-pay

## 2020-08-03 ENCOUNTER — Ambulatory Visit
Admission: RE | Admit: 2020-08-03 | Discharge: 2020-08-03 | Disposition: A | Discharge status: Home / Self Care | Payer: 59 | Source: Ambulatory Visit | Attending: Neurosurgery | Admitting: Neurosurgery

## 2020-08-03 DIAGNOSIS — M5416 Radiculopathy, lumbar region: Secondary | ICD-10-CM

## 2020-08-03 MED ORDER — GADOBENATE DIMEGLUMINE 529 MG/ML IV SOLN
18.0000 mL | Freq: Once | INTRAVENOUS | Status: AC | PRN
  Administered 2020-08-03: 18 mL via INTRAVENOUS

## 2020-08-20 ENCOUNTER — Encounter: Payer: Self-pay | Admitting: Family Medicine

## 2020-08-20 ENCOUNTER — Other Ambulatory Visit: Payer: Self-pay

## 2020-08-20 ENCOUNTER — Ambulatory Visit (INDEPENDENT_AMBULATORY_CARE_PROVIDER_SITE_OTHER): Payer: 59 | Admitting: Family Medicine

## 2020-08-20 VITALS — BP 124/78 | HR 72 | Temp 98.3°F | Resp 18 | Ht 71.0 in | Wt 192.2 lb

## 2020-08-20 DIAGNOSIS — Z23 Encounter for immunization: Secondary | ICD-10-CM | POA: Diagnosis not present

## 2020-08-20 DIAGNOSIS — K219 Gastro-esophageal reflux disease without esophagitis: Secondary | ICD-10-CM

## 2020-08-20 DIAGNOSIS — M5126 Other intervertebral disc displacement, lumbar region: Secondary | ICD-10-CM

## 2020-08-20 DIAGNOSIS — I10 Essential (primary) hypertension: Secondary | ICD-10-CM

## 2020-08-20 DIAGNOSIS — E785 Hyperlipidemia, unspecified: Secondary | ICD-10-CM

## 2020-08-20 DIAGNOSIS — R918 Other nonspecific abnormal finding of lung field: Secondary | ICD-10-CM

## 2020-08-20 DIAGNOSIS — Z0000 Encounter for general adult medical examination without abnormal findings: Secondary | ICD-10-CM | POA: Diagnosis not present

## 2020-08-20 DIAGNOSIS — D229 Melanocytic nevi, unspecified: Secondary | ICD-10-CM

## 2020-08-20 DIAGNOSIS — Z125 Encounter for screening for malignant neoplasm of prostate: Secondary | ICD-10-CM

## 2020-08-20 DIAGNOSIS — E538 Deficiency of other specified B group vitamins: Secondary | ICD-10-CM

## 2020-08-20 DIAGNOSIS — Z8719 Personal history of other diseases of the digestive system: Secondary | ICD-10-CM

## 2020-08-20 MED ORDER — OMEPRAZOLE 20 MG PO CPDR: 20.0000 mg | DELAYED_RELEASE_CAPSULE | Freq: Every day | ORAL | 3 refills | Status: DC

## 2020-08-20 MED ORDER — CLONAZEPAM 0.5 MG PO TABS: 0.5000 mg | ORAL_TABLET | Freq: Every evening | ORAL | 5 refills | Status: DC | PRN

## 2020-08-20 NOTE — Patient Instructions (Addendum)
Health Maintenance Due  Topic Date Due  . INFLUENZA VACCINE In office flu shot today 04/22/2020   Consider shingrix at pharmacy- let us know if you get this completed.   Lets set a goal of 5-10 lbs down over next year even factoring in thanksgiving and birthday!   Recommended follow up: Return in about 1 year (around 08/20/2021) for physical or sooner if needed (such as if home blood pressures greater than 140/90).

## 2020-08-20 NOTE — Progress Notes (Signed)
Phone: [phone]   Subjective:  Patient presents today for their annual physical. Chief complaint-noted.   See problem oriented charting- ROS- full  review of systems was completed and negative  except for: back pain, insomnia, mild mouth sores when breathes through mouth at night, mild abdominal pain- 2018 or 2019 in hospital twice. If gets abdominal pain he will go on liquids 3-4 days and this resolves the issue  The following were reviewed and entered/updated in epic: Past Medical History:  Diagnosis Date  . Allergy   . Blood transfusion without reported diagnosis   . Cough variant asthma 10/12/2007   no per pt 06-19-16  . Elevated LFTs   . Esophageal reflux   . GERD (gastroesophageal reflux disease)   . Herniated nucleus pulposus, L3-4 left 06/20/2019  . Hiatal hernia   . Hyperlipidemia   . Irritable bowel syndrome   . LATERAL EPICONDYLITIS 03/07/2010   Qualifier: Diagnosis of  By: Elease Hashimoto MD, Bruce  left shoulder arthrits per pt 06-19-16  . Liver lesion   . Lumbar radiculopathy 06/20/2019  . Lung mass   . Personal history of colonic polyps 09/28/2009    ADENOMATOUS POLYP  . PONV (postoperative nausea and vomiting)   . Schatzki's ring   . Vitamin B 12 deficiency    Patient Active Problem List   Diagnosis Date Noted  . Insomnia 05/10/2015    Priority: Medium  . Essential hypertension 05/10/2015    Priority: Medium  . Hyperlipidemia 05/12/2007    Priority: Medium  . GERD 05/12/2007    Priority: Medium  . Hemangioma of liver 11/24/2017    Priority: Low  . Gallstones 05/10/2015    Priority: Low  . History of colonic polyps 10/03/2010    Priority: Low  . B12 deficiency 09/05/2009    Priority: Low  . IBS 08/31/2009    Priority: Low  . ALLERGIC RHINITIS, SEASONAL 07/24/2009    Priority: Low  . External hemorrhoids 06/07/2008    Priority: Low  . Lumbar radiculopathy 06/20/2019  . Herniated nucleus pulposus, L3-4 left 06/20/2019  . Small bowel obstruction ([place])  11/18/2017  . Pulmonary nodules 06/03/2016  . SBO (small bowel obstruction) ([place]) 04/11/2016  . Hypokalemia 04/11/2016  . Total bilirubin, elevated 04/11/2016   Past Surgical History:  Procedure Laterality Date  . BACK SURGERY  07/12/2019   Dr.Stern  . COLONOSCOPY  07/03/2016  . Exploration Laparotomy and Small Bowel resection    . LEG SURGERY     left  . LUMBAR LAMINECTOMY     x3 last one June 2017  . microdisctomy  07/12/2019   L3-L4  . POLYPECTOMY    . UPPER GASTROINTESTINAL ENDOSCOPY  07/03/2016  . VENTRAL HERNIA REPAIR    . VIDEO BRONCHOSCOPY Bilateral 06/10/2016   Procedure: VIDEO BRONCHOSCOPY WITH FLUORO;  Surgeon: Marshell Garfinkel, MD;  Location: [place];  Service: Cardiopulmonary;  Laterality: Bilateral;  . VIDEO BRONCHOSCOPY WITH ENDOBRONCHIAL ULTRASOUND N/A 09/08/2016   Procedure: VIDEO BRONCHOSCOPY WITH ENDOBRONCHIAL ULTRASOUND;  Surgeon: Marshell Garfinkel, MD;  Location: [place];  Service: Pulmonary;  Laterality: N/A;    Family History  Problem Relation Age of Onset  . Breast cancer Mother   . Thyroid cancer Mother   . Healthy Father   . Colon cancer Neg Hx   . Esophageal cancer Neg Hx   . Rectal cancer Neg Hx   . Stomach cancer Neg Hx   . Colon polyps Neg Hx     Medications- reviewed and updated Current Outpatient Medications  Medication Sig Dispense Refill  . amLODipine (NORVASC) 2.5 MG tablet TAKE 1 TABLET BY MOUTH EVERY DAY 90 tablet 1  . atorvastatin (LIPITOR) 10 MG tablet TAKE 1 TABLET BY MOUTH EVERY DAY 90 tablet 3  . cyanocobalamin (,VITAMIN B-12,) 1000 MCG/ML injection INJECT 1 ML (1,000 MCG TOTAL) INTO THE SKIN EVERY 30 (THIRTY) DAYS. 3 mL 1  . gabapentin (NEURONTIN) 300 MG capsule PLEASE SEE ATTACHED FOR DETAILED DIRECTIONS  0  . ibuprofen (ADVIL) 800 MG tablet Take 800 mg by mouth every 6 (six) hours as needed.     Marland Kitchen omeprazole (PRILOSEC) 20 MG capsule Take 20 mg by mouth daily.    . clonazePAM (KLONOPIN) 0.5 MG tablet Take 1 tablet (0.5 mg total)  by mouth at bedtime as needed. for sleep 30 tablet 5  . fluticasone (FLONASE) 50 MCG/ACT nasal spray Place 1 spray into both nostrils daily for 14 days. (Patient not taking: Reported on 01/06/2020) 16 g 0   No current facility-administered medications for this visit.    Allergies-reviewed and updated Allergies  Allergen Reactions  . 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 Oropharynx normal Neck: no thyromegaly CV: RRR no murmurs rubs or gallops Lungs: CTAB no crackles, wheeze, rhonchi Abdomen: soft/nontender/nondistended/normal bowel sounds. No rebound or guarding. Midline scar and some scar tissue.  Ext: no edema Skin: warm, dry Neuro: grossly normal, moves all extremities, PERRLA    Assessment and Plan  54 y.o. male presenting for annual physical.  Health Maintenance counseling: 1. Anticipatory guidance: Patient counseled regarding regular dental exams q6 months, eye exams yearly,  avoiding smoking and second hand smoke , limiting alcohol to 2 beverages per day.   2. Risk factor reduction:  Advised patient of need for regular exercise and diet rich and fruits and vegetables to reduce risk of heart attack and stroke. Exercise- 14-15k steps per day even in new role. Diet- trying to eat a healthier diet. Family stew on 13th, b-day, thanksgiving with some set backs- down 10 lbs on home scales prior to this.  Wt Readings from Last 3 Encounters:  08/20/20 192 lb 3.2 oz (87.2 kg)  01/06/20 198 lb (89.8 kg)  10/06/19 196 lb (88.9 kg)  3. Immunizations/screenings/ancillary studies.  Flu shot today.  Shingrix candidate- could consider at pharmacy Immunization History  Administered Date(s) Administered  . Influenza Inj Mdck Quad With Preservative 07/29/2019  . Influenza Split 07/14/2011  . Influenza,inj,Quad PF,6+ Mos 06/27/2013, 07/30/2015, 06/17/2016, 08/19/2017, 06/24/2018  . PFIZER SARS-COV-2 Vaccination 04/26/2020, 05/17/2020  . Td 06/23/1999, 08/25/2008  . Tdap 06/21/2016  4. Prostate cancer screening- low risk prior PSA trend, update with labs today. Nocturia once a night stable  Lab Results  Component Value Date   PSA 1.27 08/17/2019   PSA 1.38 06/25/2018   PSA 1.23 08/19/2017   5. Colon cancer screening - Colonoscopy October 14, 2019 with 3-year repeat planned due to adenomatous colon polyp history 6. Skin cancer screening- we referred to dermatology in October but they  could not see him per report until April. He is asking for new referral to see if he can be seen sooner for atypical moles on back. advised regular sunscreen use.  7. Never smoker 8. STD screening - only active with wife  Status of chronic or acute concerns   #Low back pain-history of neurosurgery Dr. Vertell Limber July 12, 2019 with microdiscectomy L3-L4- off last week and best pain has been in a while.  Patient remains on gabapentin with neurosurgery mainly at night  # Insomnia- will either use gabapentin or clonazepam before bed and that's helpful   #hypertension S: medication: Amlodipine 2.5 mg BP Readings from Last 3 Encounters:  08/20/20 124/78  01/06/20 (!) 164/100  11/16/19 (!) 150/94  Home readings #s: usually 125-235/75-80- occasionally will have higher # such as when went into Dr. Melven Sartorius office within last month and up to 157- tends to be higher in office but interestingly looked good today.  A/P: Stable. Continue current medications.    #hyperlipidemia- peak LDL of 191 S: Medication:Atorvastatin 10 mg Lab Results  Component Value Date   CHOL 193 08/17/2019   HDL 48.80 08/17/2019   LDLCALC  120 (H) 08/17/2019   LDLDIRECT 145.0 07/22/2016   TRIG 123.0 08/17/2019   CHOLHDL 4 08/17/2019  A/P:  Patient with over 30% reduction in LDL from peak- we will continue current medicine unless above 130   # GERD S:Medication: On Dexilant 30 mg in the past-has now converted to omeprazole 20 mg over-the-counter A/P: doing well - continue current meds    #B12 deficiency S: Patient receives monthly B12 injections at home.  Nephew is an Therapist, sports A/P: hopefully stable- update with labs today   Recommended follow up: Return in about 1 year (around 08/20/2021) for physical or sooner if needed (such as if home blood pressures greater than 140/90).  Lab/Order associations:non fasting- coffee with cream and sugar   ICD-10-CM   1. Preventative health care  Z00.00 CBC With Differential/Platelet    COMPLETE METABOLIC PANEL WITH GFR    Lipid Panel w/reflex Direct LDL    PSA  2. Hyperlipidemia, unspecified hyperlipidemia type  E78.5 CBC With Differential/Platelet    COMPLETE METABOLIC PANEL WITH GFR    Lipid Panel w/reflex Direct LDL  3. Essential hypertension  I10   4. Gastroesophageal reflux disease without esophagitis  K21.9   5. Screening for prostate cancer  Z12.5 PSA  6. B12 deficiency  E53.8 Vitamin B12  7. Atypical mole  D22.9 Ambulatory referral to Dermatology    Meds ordered this encounter  Medications  . clonazePAM (KLONOPIN) 0.5 MG tablet    Sig: Take 1 tablet (0.5 mg total) by mouth at bedtime as needed. for sleep    Dispense:  30 tablet    Refill:  5    Return precautions advised.  Garret Reddish, MD

## 2020-08-21 LAB — COMPLETE METABOLIC PANEL WITH GFR
AG Ratio: 2.2 (calc) (ref 1.0–2.5)
ALT: 64 U/L — ABNORMAL HIGH (ref 9–46)
AST: 35 U/L (ref 10–35)
Albumin: 4.7 g/dL (ref 3.6–5.1)
Alkaline phosphatase (APISO): 105 U/L (ref 35–144)
BUN: 17 mg/dL (ref 7–25)
CO2: 29 mmol/L (ref 20–32)
Calcium: 9.8 mg/dL (ref 8.6–10.3)
Chloride: 106 mmol/L (ref 98–110)
Creat: 0.98 mg/dL (ref 0.70–1.33)
GFR, Est African American: 99 mL/min/{1.73_m2} (ref >=60)
GFR, Est Non African American: 86 mL/min/{1.73_m2} (ref >=60)
Globulin: 2.1 g/dL (calc) (ref 1.9–3.7)
Glucose, Bld: 103 mg/dL — ABNORMAL HIGH (ref 65–99)
Potassium: 4.2 mmol/L (ref 3.5–5.3)
Sodium: 143 mmol/L (ref 135–146)
Total Bilirubin: 0.8 mg/dL (ref 0.2–1.2)
Total Protein: 6.8 g/dL (ref 6.1–8.1)

## 2020-08-21 LAB — CBC WITH DIFFERENTIAL/PLATELET
Absolute Monocytes: 393 cells/uL (ref 200–950)
Basophils Absolute: 51 cells/uL (ref 0–200)
Basophils Relative: 1 %
Eosinophils Absolute: 51 cells/uL (ref 15–500)
Eosinophils Relative: 1 %
HCT: 45.3 % (ref 38.5–50.0)
Hemoglobin: 15.6 g/dL (ref 13.2–17.1)
Lymphs Abs: 887 cells/uL (ref 850–3900)
MCH: 31.6 pg (ref 27.0–33.0)
MCHC: 34.4 g/dL (ref 32.0–36.0)
MCV: 91.7 fL (ref 80.0–100.0)
MPV: 10.3 fL (ref 7.5–12.5)
Monocytes Relative: 7.7 %
Neutro Abs: 3718 cells/uL (ref 1500–7800)
Neutrophils Relative %: 72.9 %
Platelets: 215 10*3/uL (ref 140–400)
RBC: 4.94 10*6/uL (ref 4.20–5.80)
RDW: 12.1 % (ref 11.0–15.0)
Total Lymphocyte: 17.4 %
WBC: 5.1 10*3/uL (ref 3.8–10.8)

## 2020-08-21 LAB — LIPID PANEL W/REFLEX DIRECT LDL
Cholesterol: 203 mg/dL — ABNORMAL HIGH (ref <200)
HDL: 57 mg/dL (ref >=40)
LDL Cholesterol (Calc): 122 mg/dL (calc) — ABNORMAL HIGH
Non-HDL Cholesterol (Calc): 146 mg/dL (calc) — ABNORMAL HIGH (ref <130)
Total CHOL/HDL Ratio: 3.6 (calc) (ref <5.0)
Triglycerides: 125 mg/dL (ref <150)

## 2020-08-21 LAB — VITAMIN B12: Vitamin B-12: 424 pg/mL (ref 200–1100)

## 2020-08-21 LAB — PSA: PSA: 1.25 ng/mL (ref <=4.0)

## 2020-09-20 ENCOUNTER — Other Ambulatory Visit: Payer: Self-pay | Admitting: Family Medicine

## 2020-09-26 ENCOUNTER — Other Ambulatory Visit: Payer: Self-pay | Admitting: Family Medicine

## 2020-09-26 ENCOUNTER — Encounter: Payer: Self-pay | Admitting: Family Medicine

## 2020-09-26 MED ORDER — PREDNISONE 20 MG PO TABS: ORAL_TABLET | ORAL | 0 refills | Status: DC

## 2021-01-16 ENCOUNTER — Ambulatory Visit: Payer: 59 | Admitting: Dermatology

## 2021-02-01 ENCOUNTER — Encounter: Payer: Self-pay | Admitting: Family Medicine

## 2021-02-01 MED ORDER — PREDNISONE 20 MG PO TABS: ORAL_TABLET | ORAL | 0 refills | Status: DC

## 2021-02-04 ENCOUNTER — Other Ambulatory Visit: Payer: Self-pay

## 2021-02-04 ENCOUNTER — Encounter: Payer: Self-pay | Admitting: Family Medicine

## 2021-02-04 ENCOUNTER — Ambulatory Visit: Payer: BC Managed Care – PPO | Admitting: Family Medicine

## 2021-02-04 VITALS — BP 162/92 | HR 70 | Temp 98.6°F | Ht 71.0 in | Wt 200.6 lb

## 2021-02-04 DIAGNOSIS — E785 Hyperlipidemia, unspecified: Secondary | ICD-10-CM | POA: Diagnosis not present

## 2021-02-04 DIAGNOSIS — M5442 Lumbago with sciatica, left side: Secondary | ICD-10-CM

## 2021-02-04 DIAGNOSIS — I10 Essential (primary) hypertension: Secondary | ICD-10-CM

## 2021-02-04 DIAGNOSIS — M5126 Other intervertebral disc displacement, lumbar region: Secondary | ICD-10-CM

## 2021-02-04 MED ORDER — PREDNISONE 20 MG PO TABS: ORAL_TABLET | ORAL | 0 refills | Status: DC

## 2021-02-04 NOTE — Patient Instructions (Addendum)
Use pocket prescription if needed for recurrent issues if recurs on weekend but please still update [place] by mychart to keep [place] in the loop and also update Dr. Vertell Limber. Hoping you do not need injection or surgery.   Blood pressure high- when you are back to baseline pain please let us know if running >135/85 on average  Recommended follow up: keep December visit

## 2021-02-04 NOTE — Progress Notes (Signed)
Phone [phone] In person visit   Subjective:   Bradley [name] is a 55 y.o. year old very pleasant male patient who presents for/with See problem oriented charting Chief Complaint  Patient presents with  . Back Pain    Last Wednesday. Prednisone is helping.    This visit occurred during the SARS-CoV-2 public health emergency.  Safety protocols were in place, including screening questions prior to the visit, additional usage of staff PPE, and extensive cleaning of exam room while observing appropriate contact time as indicated for disinfecting solutions.   Past Medical History-  Patient Active Problem List   Diagnosis Date Noted  . Herniated nucleus pulposus, L3-4 left 06/20/2019    Priority: High  . History of small bowel obstruction 04/11/2016    Priority: High  . Pulmonary nodules 06/03/2016    Priority: Medium  . Insomnia 05/10/2015    Priority: Medium  . Essential hypertension 05/10/2015    Priority: Medium  . Hyperlipidemia 05/12/2007    Priority: Medium  . GERD 05/12/2007    Priority: Medium  . Hemangioma of liver 11/24/2017    Priority: Low  . Gallstones 05/10/2015    Priority: Low  . History of colonic polyps 10/03/2010    Priority: Low  . B12 deficiency 09/05/2009    Priority: Low  . IBS 08/31/2009    Priority: Low  . ALLERGIC RHINITIS, SEASONAL 07/24/2009    Priority: Low  . External hemorrhoids 06/07/2008    Priority: Low    Medications- reviewed and updated Current Outpatient Medications  Medication Sig Dispense Refill  . amLODipine (NORVASC) 2.5 MG tablet TAKE 1 TABLET BY MOUTH EVERY DAY 90 tablet 1  . atorvastatin (LIPITOR) 10 MG tablet TAKE 1 TABLET BY MOUTH EVERY DAY 90 tablet 3  . clonazePAM (KLONOPIN) 0.5 MG tablet Take 1 tablet (0.5 mg total) by mouth at bedtime as needed. for sleep 30 tablet 5  . cyanocobalamin (,VITAMIN B-12,) 1000 MCG/ML injection INJECT 1 ML (1,000 MCG TOTAL) INTO THE SKIN EVERY 30 (THIRTY) DAYS. 3 mL 1  . fluticasone  (FLONASE) 50 MCG/ACT nasal spray Place 1 spray into both nostrils daily for 14 days. 16 g 0  . gabapentin (NEURONTIN) 300 MG capsule PLEASE SEE ATTACHED FOR DETAILED DIRECTIONS  0  . ibuprofen (ADVIL) 800 MG tablet Take 800 mg by mouth every 6 (six) hours as needed.     Marland Kitchen omeprazole (PRILOSEC) 20 MG capsule Take 1 capsule (20 mg total) by mouth daily. 90 capsule 3  . predniSONE (DELTASONE) 20 MG tablet Take 2 pills for 3 days, 1 pill for 4 days 10 tablet 0   No current facility-administered medications for this visit.     Objective:  BP (!) 162/92   Pulse 70   Temp 98.6 F (37 C) (Temporal)   Ht 5\' 11"  (1.803 m)   Wt 200 lb 9.6 oz (91 kg)   SpO2 96%   BMI 27.98 kg/m  Gen: NAD, resting comfortably CV: RRR no murmurs rubs or gallops Lungs: CTAB no crackles, wheeze, rhonchi Abdomen: soft/nontender/nondistended/normal bowel sounds. No rebound or guarding.  BACK: Some midline back pain just below 2nd incision, also paraspinal tenderness and spasm on the left side of low back, restricted ROM with forward flexion, limited ROM cant reach toes by at least a half of foot which is his baseline, mild pain in left lower back with hip flexion with good ROM, Ext: No edema, 5/5 strength bilaterally, Right Leg: negative straight raise, slight pain with  internal rotation in the back of both legs. Left leg: straight leg raise on the left reproduce pain in left thigh but no paresthesias Normal strength lower extremities     Assessment and Plan   #Low back Pain with sciatica in the left leg S: Patient with history of herniated nucleus pulposus L3-L4 on the left status post surgery with Dr. Vertell Limber October 2020 through total of 4 back surgeries over the years since 2004  Patient felt like he did reasonably well for almost a year after his surgery in 2020 but recently has been having flareups almost every 6 months.  Had a flareup in November and had updated MRI with Dr. Vertell Limber.  Sometimes his symptoms  resolve with prednisone-other times requires injections.  Patient reached out to me last week and requested a refill of prednisone that she is already started and note a significant difference as below  Pain rating, quality, Location-pain radiate from left thigh down to left foot-Rates pain 9/10 last week but now 4/10 after starting prednisone Started, duration-Recent flare up last Wednesday(5/11), November 2021, it takes days for his symptoms to resolved.  Worse with (sitting-related to herniated disc compression)-bending down and standing worsens the pain, mild relief when sitting Relieved by-Prednisone in the past  Previous Treatment-ibuprofen 800 mg has helped some as well as prednisone Previous imaging-most recent MRI November 2021 "L2-3 shallow central and right foraminal protrusions/annular fissuring with mild spinal canal and right neural foraminal narrowing, unchanged.  Left L3-4 subarticular protrusion/annular fissuring abutting the descending left L4 nerve root with mild spinal canal narrowing.  Mild bilateral L4-5 and L5-S1 neural foraminal narrowing.  Increased conspicuity of central L5-S1 protrusion/annular fissuring grazing the exiting L5/descending S1 nerve roots." ROS-No saddle anesthesia, bladder incontinence, fecal incontinence, weakness in extremity, numbness or tingling in extremity. History negative for trauma, history of cancer, fever, chills, unintentional weight loss, recent bacterial infection, recent IV drug use, HIV, pain worse at night or while supine.  A/P: Left low back pain with sciatica in the left leg- patient has noted substantial improvement already on prednisone since Friday- he agrees to finish course and if fails to get back down to baseline level of pain we will follow-up with Dr. Vertell Limber.  I did give patient a pocket prescription for prednisone in case he has another recurrence particular if it is on a weekend or holiday-he is pretty in tune with his  body.  Ibuprofen 800 mg okay short-term  #hypertension S: medication: Amlodipine 2.5 mg Home readings #s: Reports typically 120s or low 130s over 70s to low 80s BP Readings from Last 3 Encounters:  02/04/21 (!) 162/92  08/20/20 124/78  01/06/20 (!) 164/100  A/P: Mild poor control today but patient with increased level of pain from baseline and home readings previously been well controlled-we opted to monitor for now he will let me know if fails to improve his pain continues to improve.  I am okay with him continuing prednisone as well as ibuprofen short-term  #hyperlipidemia S: Medication: Atorvastatin 10 mg Lab Results  Component Value Date   CHOL 203 (H) 08/20/2020   HDL 57 08/20/2020   LDLCALC 122 (H) 08/20/2020   LDLDIRECT 145.0 07/22/2016   TRIG 125 08/20/2020   CHOLHDL 3.6 08/20/2020   A/P: Last visit we discussed working on healthy eating/regular exercise to bring down lipid levels- mild poor control but for now patient will continue current medication-recheck at next physical in December   Recommended follow up: Keep December physical or sooner  if needed Future Appointments  Date Time Provider [place]  08/22/2021  8:00 AM Marin Olp, MD [facility]    Lab/Order associations:   ICD-10-CM   1. Left-sided low back pain with left-sided sciatica, unspecified chronicity  M54.42   2. Essential hypertension  I10   3. Hyperlipidemia, unspecified hyperlipidemia type  E78.5   4. Herniated nucleus pulposus, L3-4 left  M51.26     Meds ordered this encounter  Medications  . predniSONE (DELTASONE) 20 MG tablet    Sig: Take 2 pills for 3 days, 1 pill for 4 days    Dispense:  10 tablet    Refill:  0   Return precautions advised.  Garret Reddish, MD

## 2021-02-25 DIAGNOSIS — M5126 Other intervertebral disc displacement, lumbar region: Secondary | ICD-10-CM | POA: Diagnosis not present

## 2021-02-25 DIAGNOSIS — G8929 Other chronic pain: Secondary | ICD-10-CM | POA: Diagnosis not present

## 2021-02-25 DIAGNOSIS — M5416 Radiculopathy, lumbar region: Secondary | ICD-10-CM | POA: Diagnosis not present

## 2021-02-25 DIAGNOSIS — M545 Low back pain, unspecified: Secondary | ICD-10-CM | POA: Diagnosis not present

## 2021-03-20 ENCOUNTER — Other Ambulatory Visit: Payer: Self-pay | Admitting: Family Medicine

## 2021-03-28 ENCOUNTER — Other Ambulatory Visit: Payer: Self-pay | Admitting: Family Medicine

## 2021-04-05 ENCOUNTER — Encounter: Payer: Self-pay | Admitting: Family Medicine

## 2021-04-07 ENCOUNTER — Encounter (HOSPITAL_COMMUNITY): Payer: Self-pay | Admitting: Emergency Medicine

## 2021-04-07 ENCOUNTER — Emergency Department (HOSPITAL_COMMUNITY): Payer: BC Managed Care – PPO

## 2021-04-07 ENCOUNTER — Other Ambulatory Visit: Payer: Self-pay

## 2021-04-07 ENCOUNTER — Emergency Department (HOSPITAL_COMMUNITY)
Admission: EM | Admit: 2021-04-07 | Discharge: 2021-04-07 | Disposition: A | Discharge status: Home / Self Care | Payer: BC Managed Care – PPO | Attending: Emergency Medicine | Admitting: Emergency Medicine

## 2021-04-07 DIAGNOSIS — K529 Noninfective gastroenteritis and colitis, unspecified: Secondary | ICD-10-CM

## 2021-04-07 DIAGNOSIS — R109 Unspecified abdominal pain: Secondary | ICD-10-CM | POA: Diagnosis not present

## 2021-04-07 DIAGNOSIS — K219 Gastro-esophageal reflux disease without esophagitis: Secondary | ICD-10-CM | POA: Diagnosis not present

## 2021-04-07 DIAGNOSIS — N281 Cyst of kidney, acquired: Secondary | ICD-10-CM | POA: Diagnosis not present

## 2021-04-07 DIAGNOSIS — K7689 Other specified diseases of liver: Secondary | ICD-10-CM | POA: Diagnosis not present

## 2021-04-07 DIAGNOSIS — I10 Essential (primary) hypertension: Secondary | ICD-10-CM | POA: Insufficient documentation

## 2021-04-07 DIAGNOSIS — Z86018 Personal history of other benign neoplasm: Secondary | ICD-10-CM | POA: Diagnosis not present

## 2021-04-07 DIAGNOSIS — R1110 Vomiting, unspecified: Secondary | ICD-10-CM | POA: Insufficient documentation

## 2021-04-07 DIAGNOSIS — R140 Abdominal distension (gaseous): Secondary | ICD-10-CM | POA: Diagnosis not present

## 2021-04-07 DIAGNOSIS — K8020 Calculus of gallbladder without cholecystitis without obstruction: Secondary | ICD-10-CM | POA: Diagnosis not present

## 2021-04-07 DIAGNOSIS — K439 Ventral hernia without obstruction or gangrene: Secondary | ICD-10-CM | POA: Diagnosis not present

## 2021-04-07 LAB — CBC
HCT: 46 % (ref 39.0–52.0)
Hemoglobin: 16.4 g/dL (ref 13.0–17.0)
MCH: 31.7 pg (ref 26.0–34.0)
MCHC: 35.7 g/dL (ref 30.0–36.0)
MCV: 89 fL (ref 80.0–100.0)
Platelets: 316 10*3/uL (ref 150–400)
RBC: 5.17 MIL/uL (ref 4.22–5.81)
RDW: 12.3 % (ref 11.5–15.5)
WBC: 13.8 10*3/uL — ABNORMAL HIGH (ref 4.0–10.5)
nRBC: 0 % (ref 0.0–0.2)

## 2021-04-07 LAB — COMPREHENSIVE METABOLIC PANEL
ALT: 38 U/L (ref 0–44)
AST: 27 U/L (ref 15–41)
Albumin: 4.2 g/dL (ref 3.5–5.0)
Alkaline Phosphatase: 79 U/L (ref 38–126)
Anion gap: 6 (ref 5–15)
BUN: 14 mg/dL (ref 6–20)
CO2: 24 mmol/L (ref 22–32)
Calcium: 9.2 mg/dL (ref 8.9–10.3)
Chloride: 109 mmol/L (ref 98–111)
Creatinine, Ser: 1.01 mg/dL (ref 0.61–1.24)
GFR, Estimated: >60 mL/min (ref >60)
Glucose, Bld: 126 mg/dL — ABNORMAL HIGH (ref 70–99)
Potassium: 3.4 mmol/L — ABNORMAL LOW (ref 3.5–5.1)
Sodium: 139 mmol/L (ref 135–145)
Total Bilirubin: 1.3 mg/dL — ABNORMAL HIGH (ref 0.3–1.2)
Total Protein: 6.7 g/dL (ref 6.5–8.1)

## 2021-04-07 LAB — LIPASE, BLOOD: Lipase: 30 U/L (ref 11–51)

## 2021-04-07 MED ORDER — SODIUM CHLORIDE 0.9 % IV BOLUS
1000.0000 mL | Freq: Once | INTRAVENOUS | Status: AC
  Administered 2021-04-07: 1000 mL via INTRAVENOUS

## 2021-04-07 MED ORDER — ONDANSETRON HCL 4 MG/2ML IJ SOLN
4.0000 mg | Freq: Once | INTRAMUSCULAR | Status: AC
  Administered 2021-04-07: 4 mg via INTRAVENOUS
  Filled 2021-04-07: qty 2

## 2021-04-07 MED ORDER — MORPHINE SULFATE (PF) 4 MG/ML IV SOLN
4.0000 mg | Freq: Once | INTRAVENOUS | Status: AC
Start: 2021-04-07 — End: 2021-04-07
  Administered 2021-04-07: 4 mg via INTRAVENOUS
  Filled 2021-04-07: qty 1

## 2021-04-07 MED ORDER — IOHEXOL 300 MG/ML  SOLN
100.0000 mL | Freq: Once | INTRAMUSCULAR | Status: AC | PRN
  Administered 2021-04-07: 100 mL via INTRAVENOUS

## 2021-04-07 MED ORDER — ONDANSETRON 4 MG PO TBDP: ORAL_TABLET | ORAL | 0 refills | Status: DC

## 2021-04-07 NOTE — ED Notes (Signed)
Patient with round of emesis in triage
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
## 2021-04-07 NOTE — ED Triage Notes (Signed)
Patient from home complaint of abdominal pain. Pt with hx of bowel obstruction. No solid bowel movements since Monday.

## 2021-04-07 NOTE — Discharge Instructions (Addendum)
Take Zofran for nausea  Stay hydrated.  See your doctor for follow-up.  Return to ER if you have severe abdominal pain or abdominal distention or vomiting or fevers

## 2021-04-07 NOTE — ED Notes (Signed)
Pt returned from CT °

## 2021-04-08 NOTE — Telephone Encounter (Signed)
Called and LVM to call the office back to schedule an appt

## 2021-08-06 ENCOUNTER — Telehealth: Payer: BC Managed Care – PPO | Admitting: Family Medicine

## 2021-08-06 VITALS — Ht 71.0 in | Wt 190.0 lb

## 2021-08-06 DIAGNOSIS — J301 Allergic rhinitis due to pollen: Secondary | ICD-10-CM

## 2021-08-06 DIAGNOSIS — K219 Gastro-esophageal reflux disease without esophagitis: Secondary | ICD-10-CM

## 2021-08-06 DIAGNOSIS — J329 Chronic sinusitis, unspecified: Secondary | ICD-10-CM

## 2021-08-06 MED ORDER — AZELASTINE HCL 0.1 % NA SOLN: 2.0000 | Freq: Two times a day (BID) | NASAL | 12 refills | Status: DC

## 2021-08-06 MED ORDER — AMOXICILLIN-POT CLAVULANATE 875-125 MG PO TABS: 1.0000 | ORAL_TABLET | Freq: Two times a day (BID) | ORAL | 0 refills | Status: DC

## 2021-08-06 MED ORDER — PROMETHAZINE-DM 6.25-15 MG/5ML PO SYRP
5.0000 mL | ORAL_SOLUTION | Freq: Four times a day (QID) | ORAL | 0 refills | Status: DC | PRN
Start: 2021-08-06 — End: 2021-08-22

## 2021-08-06 NOTE — Progress Notes (Signed)
   [name] is a 55 y.o. male who presents today for a virtual office visit.  Assessment/Plan:  Sinusitis No red flags though given that symptoms have been persistent for the last few weeks we will start course of Augmentin.  Also send in Astelin nasal spray and promethazine-dextromethorphan cough syrup.  Encourage good hydration.  Discussed reasons to return to care.  Advised patient he will need in person office visit and possible chest x-ray if symptoms do not improve with above.  He does have a history of GERD and allergic rhinitis however do not suspect that these are contributing to his cough currently.     Subjective:  HPI:  Patient with cough for about 2 weeks. Initially associated with malaise and myalgias. Also had a fever. Took several home covid tests which were negative. Fever subsided about a week ago. Has a lot of congestion and rhinorrhea. Tried taking OTC cough medication and some leftover tessalon which not helped. No shortness of breath. No wheezing. Worse when laying down.        Objective/Observations  Physical Exam: Gen: NAD, resting comfortably Pulm: Normal work of breathing Neuro: Grossly normal, moves all extremities Psych: Normal affect and thought content  Virtual Visit via Video   I connected with [name] on 08/06/21 at  4:00 PM EST by a video enabled telemedicine application and verified that I am speaking with the correct person using two identifiers. The limitations of evaluation and management by telemedicine and the availability of in person appointments were discussed. The patient expressed understanding and agreed to proceed.   Patient location: Home Provider location: [place] participating in the virtual visit: Myself and Patient     Algis Greenhouse. Jerline Pain, MD 08/06/2021 9:06 AM

## 2021-08-07 ENCOUNTER — Other Ambulatory Visit: Payer: Self-pay | Admitting: Family Medicine

## 2021-08-22 ENCOUNTER — Other Ambulatory Visit: Payer: Self-pay

## 2021-08-22 ENCOUNTER — Ambulatory Visit (INDEPENDENT_AMBULATORY_CARE_PROVIDER_SITE_OTHER): Payer: BC Managed Care – PPO | Admitting: Family Medicine

## 2021-08-22 ENCOUNTER — Encounter: Payer: Self-pay | Admitting: Family Medicine

## 2021-08-22 VITALS — BP 138/86 | HR 75 | Temp 98.1°F | Ht 71.0 in | Wt 196.0 lb

## 2021-08-22 DIAGNOSIS — Z1283 Encounter for screening for malignant neoplasm of skin: Secondary | ICD-10-CM

## 2021-08-22 DIAGNOSIS — K219 Gastro-esophageal reflux disease without esophagitis: Secondary | ICD-10-CM

## 2021-08-22 DIAGNOSIS — E785 Hyperlipidemia, unspecified: Secondary | ICD-10-CM | POA: Diagnosis not present

## 2021-08-22 DIAGNOSIS — E538 Deficiency of other specified B group vitamins: Secondary | ICD-10-CM | POA: Diagnosis not present

## 2021-08-22 DIAGNOSIS — I10 Essential (primary) hypertension: Secondary | ICD-10-CM | POA: Diagnosis not present

## 2021-08-22 DIAGNOSIS — Z0000 Encounter for general adult medical examination without abnormal findings: Secondary | ICD-10-CM | POA: Diagnosis not present

## 2021-08-22 DIAGNOSIS — Z125 Encounter for screening for malignant neoplasm of prostate: Secondary | ICD-10-CM

## 2021-08-22 DIAGNOSIS — M5442 Lumbago with sciatica, left side: Secondary | ICD-10-CM

## 2021-08-22 LAB — LIPID PANEL
Cholesterol: 193 mg/dL (ref 0–200)
HDL: 53.3 mg/dL (ref >39.00)
LDL Cholesterol: 116 mg/dL — ABNORMAL HIGH (ref 0–99)
NonHDL: 139.31
Total CHOL/HDL Ratio: 4
Triglycerides: 119 mg/dL (ref 0.0–149.0)
VLDL: 23.8 mg/dL (ref 0.0–40.0)

## 2021-08-22 LAB — COMPREHENSIVE METABOLIC PANEL
ALT: 53 U/L (ref 0–53)
AST: 27 U/L (ref 0–37)
Albumin: 4.5 g/dL (ref 3.5–5.2)
Alkaline Phosphatase: 95 U/L (ref 39–117)
BUN: 20 mg/dL (ref 6–23)
CO2: 32 mEq/L (ref 19–32)
Calcium: 9.4 mg/dL (ref 8.4–10.5)
Chloride: 105 mEq/L (ref 96–112)
Creatinine, Ser: 0.95 mg/dL (ref 0.40–1.50)
GFR: 89.16 mL/min (ref >60.00)
Glucose, Bld: 100 mg/dL — ABNORMAL HIGH (ref 70–99)
Potassium: 4.3 mEq/L (ref 3.5–5.1)
Sodium: 142 mEq/L (ref 135–145)
Total Bilirubin: 0.9 mg/dL (ref 0.2–1.2)
Total Protein: 6.6 g/dL (ref 6.0–8.3)

## 2021-08-22 LAB — CBC WITH DIFFERENTIAL/PLATELET
Basophils Absolute: 0 10*3/uL (ref 0.0–0.1)
Basophils Relative: 0.7 % (ref 0.0–3.0)
Eosinophils Absolute: 0.1 10*3/uL (ref 0.0–0.7)
Eosinophils Relative: 1.3 % (ref 0.0–5.0)
HCT: 45.6 % (ref 39.0–52.0)
Hemoglobin: 15.6 g/dL (ref 13.0–17.0)
Lymphocytes Relative: 21.1 % (ref 12.0–46.0)
Lymphs Abs: 1 10*3/uL (ref 0.7–4.0)
MCHC: 34.2 g/dL (ref 30.0–36.0)
MCV: 90.5 fl (ref 78.0–100.0)
Monocytes Absolute: 0.4 10*3/uL (ref 0.1–1.0)
Monocytes Relative: 9.3 % (ref 3.0–12.0)
Neutro Abs: 3.1 10*3/uL (ref 1.4–7.7)
Neutrophils Relative %: 67.6 % (ref 43.0–77.0)
Platelets: 220 10*3/uL (ref 150.0–400.0)
RBC: 5.04 Mil/uL (ref 4.22–5.81)
RDW: 13.3 % (ref 11.5–15.5)
WBC: 4.5 10*3/uL (ref 4.0–10.5)

## 2021-08-22 LAB — VITAMIN B12: Vitamin B-12: 391 pg/mL (ref 211–911)

## 2021-08-22 LAB — PSA: PSA: 1.45 ng/mL (ref 0.10–4.00)

## 2021-08-22 MED ORDER — BENZONATATE 100 MG PO CAPS: 100.0000 mg | ORAL_CAPSULE | Freq: Two times a day (BID) | ORAL | 0 refills | Status: DC | PRN

## 2021-08-22 MED ORDER — CLONAZEPAM 0.5 MG PO TABS: 0.5000 mg | ORAL_TABLET | Freq: Every evening | ORAL | 5 refills | Status: DC | PRN

## 2021-08-22 NOTE — Patient Instructions (Addendum)
Health Maintenance Due  Topic Date Due   INFLUENZA VACCINE - flu shot today 04/22/2021   Please stop by lab before you go If you have mychart- we will send your results within 3 business days of [place] receiving them.  If you do not have mychart- we will call you about results within 5 business days of [place] receiving them.  *please also note that you will see labs on mychart as soon as they post. I will later go in and write notes on them- will say "notes from Dr. Yong Channel"  We will call you within two weeks about your referral to dermatology. If you do not hear within 2 weeks, give [place] a call.   BP goal at home on average <135/85- if we are going to continue yearly checks have to check BP at home at least every few weeks  Recommended follow up: Return in about 1 year (around 08/22/2022) for physical or sooner if needed.

## 2021-08-22 NOTE — Progress Notes (Signed)
Phone: [phone]   Subjective:  Patient presents today for their annual physical. Chief complaint-noted.   See problem oriented charting- ROS- full  review of systems was completed and negative  except for: lingering cough after sinusitis, joint and back pain, lightheadedness for a few years - if goes up to a height (now avoids)- doesn't occur otherwise,  left leg numbnress- sees neurosurgery in december  The following were reviewed and entered/updated in epic: Past Medical History:  Diagnosis Date   Allergy    Blood transfusion without reported diagnosis    Cough variant asthma 10/12/2007   no per pt 06-19-16   Elevated LFTs    Esophageal reflux    GERD (gastroesophageal reflux disease)    Herniated nucleus pulposus, L3-4 left 06/20/2019   Hiatal hernia    Hyperlipidemia    Irritable bowel syndrome    LATERAL EPICONDYLITIS 03/07/2010   Qualifier: Diagnosis of  By: Elease Hashimoto MD, Bruce  left shoulder arthrits per pt 06-19-16   Liver lesion    Lumbar radiculopathy 06/20/2019   Lung mass    Personal history of colonic polyps 09/28/2009    ADENOMATOUS POLYP   PONV (postoperative nausea and vomiting)    Schatzki's ring    Vitamin B 12 deficiency    Patient Active Problem List   Diagnosis Date Noted   Herniated nucleus pulposus, L3-4 left 06/20/2019    Priority: High   History of small bowel obstruction 04/11/2016    Priority: High   Pulmonary nodules 06/03/2016    Priority: Medium    Insomnia 05/10/2015    Priority: Medium    Essential hypertension 05/10/2015    Priority: Medium    Hyperlipidemia 05/12/2007    Priority: Medium    GERD 05/12/2007    Priority: Medium    Hemangioma of liver 11/24/2017    Priority: Low   Gallstones 05/10/2015    Priority: Low   History of colonic polyps 10/03/2010    Priority: Low   B12 deficiency 09/05/2009    Priority: Low   IBS 08/31/2009    Priority: Low   ALLERGIC RHINITIS, SEASONAL 07/24/2009    Priority: Low   External  hemorrhoids 06/07/2008    Priority: Low   Past Surgical History:  Procedure Laterality Date   BACK SURGERY  07/12/2019   Dr.Stern   COLONOSCOPY  07/03/2016   Exploration Laparotomy and Small Bowel resection     LEG SURGERY     left   LUMBAR LAMINECTOMY     x3 last one June 2017   microdisctomy  07/12/2019   L3-L4   POLYPECTOMY     UPPER GASTROINTESTINAL ENDOSCOPY  07/03/2016   VENTRAL HERNIA REPAIR     VIDEO BRONCHOSCOPY Bilateral 06/10/2016   Procedure: VIDEO BRONCHOSCOPY WITH FLUORO;  Surgeon: Marshell Garfinkel, MD;  Location: [facility];  Service: Cardiopulmonary;  Laterality: Bilateral;   VIDEO BRONCHOSCOPY WITH ENDOBRONCHIAL ULTRASOUND N/A 09/08/2016   Procedure: VIDEO BRONCHOSCOPY WITH ENDOBRONCHIAL ULTRASOUND;  Surgeon: Marshell Garfinkel, MD;  Location: [facility];  Service: Pulmonary;  Laterality: N/A;    Family History  Problem Relation Age of Onset   Breast cancer Mother    Thyroid cancer Mother    Healthy Father    Colon cancer Neg Hx    Esophageal cancer Neg Hx    Rectal cancer Neg Hx    Stomach cancer Neg Hx    Colon polyps Neg Hx     Medications- reviewed and updated Current Outpatient Medications  Medication Sig Dispense Refill  amLODipine (NORVASC) 2.5 MG tablet TAKE 1 TABLET BY MOUTH EVERY DAY 90 tablet 1   atorvastatin (LIPITOR) 10 MG tablet TAKE 1 TABLET BY MOUTH EVERY DAY 90 tablet 3   azelastine (ASTELIN) 0.1 % nasal spray Place 2 sprays into both nostrils 2 (two) times daily. 30 mL 12   clonazePAM (KLONOPIN) 0.5 MG tablet Take 1 tablet (0.5 mg total) by mouth at bedtime as needed. for sleep 30 tablet 5   cyanocobalamin (,VITAMIN B-12,) 1000 MCG/ML injection INJECT 1 ML (1,000 MCG TOTAL) INTO THE SKIN EVERY 30 (THIRTY) DAYS. 3 mL 1   omeprazole (PRILOSEC) 20 MG capsule TAKE 1 CAPSULE BY MOUTH EVERY DAY 90 capsule 3   gabapentin (NEURONTIN) 300 MG capsule PLEASE SEE ATTACHED FOR DETAILED DIRECTIONS (Patient not taking: Reported on 08/22/2021)  0   No current  facility-administered medications for this visit.    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 Oropharynx normal Neck: no thyromegaly CV: RRR no murmurs rubs or gallops Lungs: CTAB no crackles, wheeze, rhonchi Abdomen: soft/nontender/nondistended/normal bowel sounds. No rebound or guarding.  Ext: no edema Skin: warm, dry Neuro: grossly normal, moves all extremities, PERRLA    Assessment and Plan  55 y.o. [name] presenting for annual physical.  Health Maintenance counseling: 1. Anticipatory guidance: Patient counseled regarding regular dental exams -q6 months, eye exams -yearly,  avoiding smoking and second hand smoke, limiting alcohol to 2 beverages per day, no illicit drug use. 2. Risk factor reduction:  Advised patient of need for regular exercise and diet rich and fruits and vegetables to reduce risk of heart attack and stroke. Exercise- 14-15k steps per day at work. Diet/weight management-weight was 192 last year - up slightly today through thanksgiving. Going back to water this week after using coke through illness Wt Readings from Last 3 Encounters:  08/22/21 196 lb (88.9 kg)  08/06/21 190 lb (86.2 kg)  04/07/21 196 lb (88.9 kg)  3. Immunizations/screenings/ancillary studies- discuss Shingrix-wants to hold for now  and Flu shot- opts in today - otherwise up-to-date. Immunization History  Administered Date(s) Administered   Influenza Inj Mdck Quad With Preservative 07/29/2019   Influenza Split 07/14/2011   Influenza,inj,Quad PF,6+ Mos 06/27/2013, 07/30/2015, 06/17/2016, 08/19/2017,  06/24/2018, 08/20/2020   PFIZER(Purple Top)SARS-COV-2 Vaccination 04/26/2020, 05/17/2020   Td 06/23/1999, 08/25/2008   Tdap 06/21/2016  4. Prostate cancer screening-  low risk prior PSA trend, update with labs today. Nocturia once a night and stable.  Lab Results  Component Value Date   PSA 1.25 08/20/2020   PSA 1.27 08/17/2019   PSA 1.38 06/25/2018   5. Colon cancer screening - last colonoscopy 10/14/2019 with a 3-year repeat recommended due to adenomatous colon polyp history 6. Skin cancer screening- referred to dermatology in the past due to him wanting to have moles evaluated on his back-he reports he didn't see them yet. Advised regular sunscreen use. Denies worrisome, changing, or new skin lesions.  7. Smoking associated screening (lung cancer screening, AAA screen 65-75, UA)- never smoker 8. STD screening -  only active with wife  Status of chronic or acute concerns   # ED F/U for Gastroenteritis with history of obstruction S:Patient was seen in the ED 04/07/2021 with a complaint of having abdominal pain and vomiting. Concern for possible SBO versus ileus versus gastroenteritis.  CT Abdomen/Pelvis was ordered and showed "1. No CT evidence for acute intra-abdominal or pelvic abnormality. Scattered fluid-filled but nondilated small without convincing evidence for obstruction at this time. 2. Multiple liver hemangioma 3. Cholelithiasis  Patient felt better after Zofran and fluids and was stable for discharge. A/P: Today he reports no lingering issues- will let us know if recurrent . He did water and juice only and I think that's the right thing with his history - tessalon helped more than the dextromethorphan- since he tends to get this yearly would liek to have some on hand- rx provided  #Sinusitis-patient recently treated by Dr. Jerline Pain with Augmentin as well as Astelin and promethazine-dextromethorphan  #Low back pain-history of neurosurgery Dr. Vertell Limber July 12, 2019 with  microdiscectomy L3-L4. Patient remains on gabapentin 300 mg neurosurgery - 02/04/2021 visit patient reported left-sided lower back pain with sciatica in the left leg - he was started on prednisone and noted substantial improvement while on this medication. Agreed that if failed to get back down to baseline level of pain patient would follow-up with Dr. Vertell Limber. I did give him a pocket prescription of prednisone if pain were to reoccur- he had to use this about 2 months ago- and as above scheduled follow up with neurosurgery (Dr. Vertell Limber out) has persistent numbness after last flare  #hypertension S: medication: Amlodipine 2.5 mg BP Readings from Last 3 Encounters:  08/22/21 138/86  04/07/21 135/79  02/04/21 (!) 162/92  A/P: High normal but within range-continue current medication -monitors BP at home  #hyperlipidemia- peak LDL 191 S: Medication:Atorvastatin 10 mg Lab Results  Component Value Date   CHOL 203 (H) 08/20/2020   HDL 57 08/20/2020   LDLCALC 122 (H) 08/20/2020   LDLDIRECT 145.0 07/22/2016   TRIG 125 08/20/2020   CHOLHDL 3.6 08/20/2020  A/P: Considering this is for primary prevention his LDL is down more than 30% which is an adequate drop-update lipid panel today and likely continue current medication  # GERD S:Medication: On Dexilant 30 mg in the past (resolves reflux but causes significant nausea)-has now converted to omeprazole 20 mg over-the-counter - rarely has to take a 2nd A/P: due to occasional breakthrough- cannot reduce dose at this time.    #B12 deficiency S: Patient receives monthly B12 injections at home. Nephew is an Science writer  Component Value Date   [id number] 08/20/2020  A/P:   hopefully stable- update B12 today. Continue current meds for now   # Insomnia- will either use gabapentin or clonazepam before bed and that's helpful - needs refill on clonazepam  Recommended follow up: Return in about 1 year (around 08/22/2022) for physical or sooner if  needed.  Lab/Order associations: fasting   ICD-10-CM   1. Preventative health care  Z00.00     2. Hyperlipidemia, unspecified hyperlipidemia type  E78.5     3. Essential hypertension  I10     4. B12 deficiency  E53.8     5. Gastroesophageal reflux disease without esophagitis  K21.9     6. Left-sided low back pain with left-sided sciatica, unspecified chronicity  M54.42     7. Screening for prostate cancer  Z12.5     8. Screening exam for skin cancer  Z12.83       No orders of the defined types were placed in this encounter.   Return precautions advised.  Garret Reddish, MD

## 2021-09-04 DIAGNOSIS — R030 Elevated blood-pressure reading, without diagnosis of hypertension: Secondary | ICD-10-CM | POA: Diagnosis not present

## 2021-09-04 DIAGNOSIS — Z6827 Body mass index (BMI) 27.0-27.9, adult: Secondary | ICD-10-CM | POA: Diagnosis not present

## 2021-09-04 DIAGNOSIS — M5416 Radiculopathy, lumbar region: Secondary | ICD-10-CM | POA: Diagnosis not present

## 2021-09-11 ENCOUNTER — Other Ambulatory Visit: Payer: Self-pay | Admitting: Family Medicine

## 2021-09-17 ENCOUNTER — Other Ambulatory Visit: Payer: Self-pay | Admitting: Family Medicine

## 2021-10-16 DIAGNOSIS — M5416 Radiculopathy, lumbar region: Secondary | ICD-10-CM | POA: Diagnosis not present

## 2021-10-16 DIAGNOSIS — M545 Low back pain, unspecified: Secondary | ICD-10-CM | POA: Diagnosis not present

## 2021-10-16 DIAGNOSIS — M4316 Spondylolisthesis, lumbar region: Secondary | ICD-10-CM | POA: Diagnosis not present

## 2021-10-16 DIAGNOSIS — M2578 Osteophyte, vertebrae: Secondary | ICD-10-CM | POA: Diagnosis not present

## 2021-10-16 DIAGNOSIS — Z7689 Persons encountering health services in other specified circumstances: Secondary | ICD-10-CM | POA: Diagnosis not present

## 2021-10-23 DIAGNOSIS — R030 Elevated blood-pressure reading, without diagnosis of hypertension: Secondary | ICD-10-CM | POA: Diagnosis not present

## 2021-10-23 DIAGNOSIS — Z6828 Body mass index (BMI) 28.0-28.9, adult: Secondary | ICD-10-CM | POA: Diagnosis not present

## 2021-10-23 DIAGNOSIS — M5416 Radiculopathy, lumbar region: Secondary | ICD-10-CM | POA: Diagnosis not present

## 2021-12-23 DIAGNOSIS — M5416 Radiculopathy, lumbar region: Secondary | ICD-10-CM | POA: Diagnosis not present

## 2022-02-10 ENCOUNTER — Encounter: Payer: Self-pay | Admitting: Family Medicine

## 2022-02-10 ENCOUNTER — Ambulatory Visit: Payer: BC Managed Care – PPO | Admitting: Family Medicine

## 2022-02-10 VITALS — BP 138/90 | HR 78 | Temp 98.4°F | Ht 71.0 in | Wt 200.0 lb

## 2022-02-10 DIAGNOSIS — K219 Gastro-esophageal reflux disease without esophagitis: Secondary | ICD-10-CM | POA: Diagnosis not present

## 2022-02-10 DIAGNOSIS — J301 Allergic rhinitis due to pollen: Secondary | ICD-10-CM

## 2022-02-10 DIAGNOSIS — J029 Acute pharyngitis, unspecified: Secondary | ICD-10-CM

## 2022-02-10 LAB — POCT RAPID STREP A (OFFICE): Rapid Strep A Screen: NEGATIVE

## 2022-02-10 MED ORDER — DEXLANSOPRAZOLE 60 MG PO CPDR: 60.0000 mg | DELAYED_RELEASE_CAPSULE | Freq: Every day | ORAL | 5 refills | Status: DC

## 2022-02-10 NOTE — Patient Instructions (Signed)
It was very nice to see you today!  Please restart the Dexilant.  I think this is probably causing your sore throat issues.  You can also use an allergy med.  Let us know if not improving in the next couple of weeks.  Take care, Dr Jerline Pain  PLEASE NOTE:  If you had any lab tests please let us know if you have not heard back within a few days. You may see your results on mychart before we have a chance to review them but we will give you a call once they are reviewed by [place]. If we ordered any referrals today, please let us know if you have not heard from their office within the next week.   Please try these tips to maintain a healthy lifestyle:  Eat at least 3 REAL meals and 1-2 snacks per day.  Aim for no more than 5 hours between eating.  If you eat breakfast, please do so within one hour of getting up.   Each meal should contain half fruits/vegetables, one quarter protein, and one quarter carbs (no bigger than a computer mouse)  Cut down on sweet beverages. This includes juice, soda, and sweet tea.   Drink at least 1 glass of water with each meal and aim for at least 8 glasses per day  Exercise at least 150 minutes every week.

## 2022-02-10 NOTE — Progress Notes (Signed)
   Bradley [name] is a 56 y.o. male who presents today for an office visit.  Assessment/Plan:  New/Acute Problems: Sore Throat No red flags. Rapid strep negative.  Likely due to worsening gastric reflux.  We will restart Dexilant.  He has been on this in the past and has done well with this.  May have a little postnasal drip as well and recommend he start over-the-counter antihistamine.  He will let us know if not improving in the next 1 to 2 weeks and we can refer to ENT.  Chronic Problems Addressed Today: GERD Not controlled on omeprazole alone.  Will restart Dexilant.  He has had well with this in the past and was better controlled on this compared to the omeprazole  Allergic rhinitis He can use over-the-counter antihistamines as needed.     Subjective:  HPI:  Patient here with sore throat for the last 2-3 weeks. No other symptoms. Some left ear pain. No drainage. No fevers or chills. Tried OTC meds without much improvement. No known sick contacts.  No dysphasia or dysphagia. He has had some reflux that seems to be getting worse.        Objective:  Physical Exam: BP 138/90   Pulse 78   Temp 98.4 F (36.9 C) (Temporal)   Ht '5\' 11"'$  (1.803 m)   Wt 200 lb (90.7 kg)   SpO2 98%   BMI 27.89 kg/m   Gen: No acute distress, resting comfortably HEENT: TMs clear.  OP clear.  No lymphadenopathy. CV: Regular rate and rhythm with no murmurs appreciated Pulm: Normal work of breathing, clear to auscultation bilaterally with no crackles, wheezes, or rhonchi Neuro: Grossly normal, moves all extremities Psych: Normal affect and thought content      Ellena Kamen M. Jerline Pain, MD 02/10/2022 1:57 PM

## 2022-02-12 ENCOUNTER — Ambulatory Visit: Payer: BC Managed Care – PPO | Admitting: Family Medicine

## 2022-03-14 ENCOUNTER — Other Ambulatory Visit: Payer: Self-pay | Admitting: Family Medicine

## 2022-03-27 DIAGNOSIS — H9203 Otalgia, bilateral: Secondary | ICD-10-CM | POA: Diagnosis not present

## 2022-03-27 DIAGNOSIS — K219 Gastro-esophageal reflux disease without esophagitis: Secondary | ICD-10-CM | POA: Diagnosis not present

## 2022-03-29 ENCOUNTER — Other Ambulatory Visit: Payer: Self-pay | Admitting: Family Medicine

## 2022-06-04 DIAGNOSIS — Z6827 Body mass index (BMI) 27.0-27.9, adult: Secondary | ICD-10-CM | POA: Diagnosis not present

## 2022-06-04 DIAGNOSIS — M5416 Radiculopathy, lumbar region: Secondary | ICD-10-CM | POA: Diagnosis not present

## 2022-06-16 ENCOUNTER — Encounter: Payer: Self-pay | Admitting: *Deleted

## 2022-06-17 ENCOUNTER — Encounter: Payer: Self-pay | Admitting: Family Medicine

## 2022-06-17 ENCOUNTER — Ambulatory Visit: Payer: BC Managed Care – PPO | Admitting: Family Medicine

## 2022-06-17 VITALS — BP 154/86 | HR 80 | Temp 98.6°F | Ht 71.0 in | Wt 195.6 lb

## 2022-06-17 DIAGNOSIS — B019 Varicella without complication: Secondary | ICD-10-CM

## 2022-06-17 MED ORDER — HYDROXYZINE PAMOATE 25 MG PO CAPS: 25.0000 mg | ORAL_CAPSULE | Freq: Three times a day (TID) | ORAL | 0 refills | Status: DC | PRN

## 2022-06-17 NOTE — Progress Notes (Signed)
'  Subjective  CC:  Chief Complaint  Patient presents with   Rash    Pt has a rash that has been present for the past 3 days and it is itchy   Same day acute visit; PCP not available. New pt to me. Chart reviewed.   HPI: Bradley [name] is a 56 y.o. male who presents to the office today to address the problems listed above in the chief complaint. 56 year old male presents with 2 and half day of progressive itchy rash.  Started with red bumps on his lower extremities that has quickly spread to the rest of his torso and upper extremities.  No hives or known exposures.  No systemic symptoms specifically without fever, malaise, sore throat, URI symptoms, nausea vomiting or diarrhea.  He recently traveled from [place] but has no known exposures.  Unsure of his history of chickenpox.  He did cut the grass this weekend but he was having allergic reaction to something.  No shortness of breath, mucus membrane involvement.  No rash on palms or soles of feet.  Otherwise feels well.  Assessment  1. Varicella without complication      Plan  Rash: Clinically most consistent with varicella.  Check IgM antibodies.  Education given.  Hydroxyzine for supportive care.  Monitor over the next 24 to 48 hours.  If changes, he will return for reevaluation.  Differential diagnosis includes erythema multiforme, scabies, allergic reaction, viral exanthem.  Follow up: As needed 08/25/2022  Orders Placed This Encounter  Procedures   Varicella zoster antibody, IgM   Meds ordered this encounter  Medications   hydrOXYzine (VISTARIL) 25 MG capsule    Sig: Take 1 capsule (25 mg total) by mouth every 8 (eight) hours as needed.    Dispense:  30 capsule    Refill:  0      I reviewed the patients updated PMH, FH, and SocHx.    Patient Active Problem List   Diagnosis Date Noted   Herniated nucleus pulposus, L3-4 left 06/20/2019   Hemangioma of liver 11/24/2017   Pulmonary nodules 06/03/2016   History of small  bowel obstruction 04/11/2016   Insomnia 05/10/2015   Gallstones 05/10/2015   Essential hypertension 05/10/2015   History of colonic polyps 10/03/2010   B12 deficiency 09/05/2009   IBS 08/31/2009   ALLERGIC RHINITIS, SEASONAL 07/24/2009   External hemorrhoids 06/07/2008   Hyperlipidemia 05/12/2007   GERD 05/12/2007   Current Meds  Medication Sig   amLODipine (NORVASC) 2.5 MG tablet TAKE 1 TABLET BY MOUTH EVERY DAY   atorvastatin (LIPITOR) 10 MG tablet TAKE 1 TABLET BY MOUTH EVERY DAY   clonazePAM (KLONOPIN) 0.5 MG tablet Take 1 tablet (0.5 mg total) by mouth at bedtime as needed. for sleep   cyanocobalamin (,VITAMIN B-12,) 1000 MCG/ML injection INJECT 1 ML (1,000 MCG TOTAL) INTO THE SKIN EVERY 30 (THIRTY) DAYS.   dexlansoprazole (DEXILANT) 60 MG capsule Take 1 capsule (60 mg total) by mouth daily.   gabapentin (NEURONTIN) 300 MG capsule    hydrOXYzine (VISTARIL) 25 MG capsule Take 1 capsule (25 mg total) by mouth every 8 (eight) hours as needed.   omeprazole (PRILOSEC) 20 MG capsule TAKE 1 CAPSULE BY MOUTH EVERY DAY    Allergies: Patient has No Known Allergies. Family History: Patient family history includes Breast cancer in his mother; Healthy in his father; Thyroid cancer in his mother. Social History:  Patient  reports that he has never smoked. He has never used smokeless tobacco. He reports current  alcohol use. He reports that he does not use drugs.  Review of Systems: Constitutional: Negative for fever malaise or anorexia Cardiovascular: negative for chest pain Respiratory: negative for SOB or persistent cough Gastrointestinal: negative for abdominal pain  Objective  Vitals: BP (!) 154/86   Pulse 80   Temp 98.6 F (37 C)   Ht '5\' 11"'$  (1.803 m)   Wt 195 lb 9.6 oz (88.7 kg)   SpO2 96%   BMI 27.28 kg/m  General: no acute distress , A&Ox3, appears well HEENT: PEERL, conjunctiva normal, neck is supple no lymphadenopathy Cardiovascular:  RRR without murmur or gallop.   Respiratory:  Good breath sounds bilaterally, CTAB with normal respiratory effort Skin:  Warm, diffuse papular rash with vesicles on erythematous base bilateral lower and upper extremities and torso.  No facial involvement.  No hands or feet involvement.  No drainage.  No purulence.  No target lesions.    Commons side effects, risks, benefits, and alternatives for medications and treatment plan prescribed today were discussed, and the patient expressed understanding of the given instructions. Patient is instructed to call or message via MyChart if he/she has any questions or concerns regarding our treatment plan. No barriers to understanding were identified. We discussed Red Flag symptoms and signs in detail. Patient expressed understanding regarding what to do in case of urgent or emergency type symptoms.  Medication list was reconciled, printed and provided to the patient in AVS. Patient instructions and summary information was reviewed with the patient as documented in the AVS. This note was prepared with assistance of Dragon voice recognition software. Occasional wrong-word or sound-a-like substitutions may have occurred due to the inherent limitations of voice recognition software  This visit occurred during the SARS-CoV-2 public health emergency.  Safety protocols were in place, including screening questions prior to the visit, additional usage of staff PPE, and extensive cleaning of exam room while observing appropriate contact time as indicated for disinfecting solutions.

## 2022-06-17 NOTE — Patient Instructions (Signed)
Please follow up if symptoms do not improve or as needed.    Chickenpox, Adult Chickenpox is an infection that is caused by a virus (viral infection). It causes fever and then an itchy rash that turns into blisters, which eventually change into scabs. Chickenpox spreads easily from person to person (is contagious). It starts to be contagious 1-2 days before the rash appears. It remains contagious until the blisters become crusted. Chickenpox can be very serious for adults. If you have had chickenpox once, you probably will not get it again. What are the causes? This condition is caused by the varicella-zoster virus. You may get the virus by: Breathing in droplets from an infected person's cough or sneeze. Having contact with fluids from the chickenpox rash. Touching something that has been exposed to the virus (has been contaminated) and then touching your mouth, nose, or eyes. What increases the risk? This condition is more likely to develop in: People who have never had chickenpox. People who have never been vaccinated against chickenpox. Health care workers. People who live in or spend time in crowded places. These may include: College students. Teachers. People in the [employer]. People who live in an institution, such as a prison. People who have a weakened disease-fighting system (immunesystem). You may have a weakened immune system if you: Have HIV (human immunodeficiency virus). Have AIDS (acquired immunodeficiency syndrome). Have cancer. Are receiving chemotherapy. Are taking medicines that reduce (suppress) the activity of the immune system. What are the signs or symptoms? Symptoms of chickenpox are usually worse in adults than they are in children. Symptoms may include: An itchy rash that changes over time. The rash starts as red spots, which then become bumps. The bumps turn into fluid-filled blisters. The blisters crust and turn into scabs, usually about 3-7 days after the  rash started. Body aches and pains. Headache. Tiredness. Fever. Symptoms develop 10-21 days after a person has been exposed to the virus. Possible complications of chickenpox include: Skin infection. Lung infection (pneumonia). Brain infection or swelling (encephalitis). A bloodstream infection (sepsis). Bleeding problems. Dehydration. Problems with balance and muscle control (cerebellar ataxia). Having a baby with a birth defect, if you are pregnant. How is this diagnosed? This condition may be diagnosed based on: Your symptoms. Your medical history. A physical exam. Blood tests. Testing a fluid sample (culture) from the rash. How is this treated? Treatment for this condition may include: Taking medicine to shorten the illness and make it less severe. Applying calamine lotion to relieve itchiness. Using baking soda or dry oatmeal baths to soothe itchy skin. Using a medicine that reduces itching (antihistamine). Taking antibiotic medicines if you also develop a bacterial infection. Antibiotics do notcure viral infections such as chickenpox. Follow these instructions at home: Medicines Take or apply over-the-counter and prescription medicines only as told by your health care provider. These include any antihistamines and anti-itch creams. If you were prescribed an antibiotic medicine, take it as told by your health care provider. Do not stop using the antibiotic even if you start to feel better. Relieving pain, itching, and discomfort  Try to stay cool and out of the sun. Sweating and being hot can make itching worse. Cool baths can be soothing. Try adding baking soda or dry oatmeal to the water to reduce itching. Do not bathe in hot water. Put cold, wet cloths (cold compresses) on itchy areas as told by your health care provider. Use calamine lotion as recommended by your health care provider. This is an over-the-counter  lotion that helps to relieve itchiness. Do not eat or  drink foods and beverages that are spicy, salty, or acidic if you have blisters in your mouth. Soft, bland, and cold foods and beverages are easiest to swallow. Do not scratch or pick at the rash. To help avoid scratching: Keep your fingernails clean and cut short. Wear gloves or mittens while you sleep, if scratching is a problem. Preventing infection  You start to be contagious 1-2 days before the rash appears. You remain contagious until your blisters become crusted. While you are contagious, avoid being around: Pregnant women. Infants. People receiving cancer treatments or long-term steroids. People with weakened immune systems. Older people. Anyone who has not had chickenpox before. Anyone who has not been vaccinated for chickenpox. Stay home from work or school until all blisters have crusted and new spots stop appearing, or for as long as told by your health care provider. If someone has been exposed to your chickenpox, contact a health care provider to see if that person needs vaccination. Wash your hands often with soap and water for at least 20 seconds. If soap and water are not available, use hand sanitizer. Have others in your household also wash their hands often. Doing this: Lowers your chance of getting a bacterial skin infection. Lowers the chance that chickenpox will spread to others. General instructions Drink enough fluid to keep your urine pale yellow. Rest as needed. Keep all follow-up visits. This is important. How is this prevented? Getting vaccinated is the best way to prevent chickenpox. If you have not been vaccinated, talk with your health care provider about getting the vaccine. Where to find more information Centers for Disease Control and Prevention: http://www.wolf.info/ Contact a health care provider if: You have a fever. You have yellowish-white fluid coming from your blisters. You have areas of skin that become red or tender or feel warm to the touch. You  develop a cough. Your urine is a darker color than usual. Get help right away if: You cannot stop vomiting. You feel confused. You are very sleepy. You have any of these problems: A stiff neck. A severe headache. Severe joint pain or stiffness. Trouble walking or keeping your balance. A seizure. Fast breathing. Trouble breathing. Chest pain. Eye pain, red eyes, or difficulty seeing. Blood in your urine or stool (feces). You start getting many bruises on your skin. Your blisters bleed. You develop blisters in your eye. You have a fever and your symptoms suddenly get worse. Summary Chickenpox is an infection that is caused by a virus (viral infection). It causes fever and then an itchy rash that turns into blisters, which eventually change into scabs. Chickenpox spreads easily from person to person (is contagious) 1-2 days before the rash appears. It remains contagious until the blisters become crusted. Getting vaccinated is the best way to prevent chickenpox. This information is not intended to replace advice given to you by your health care provider. Make sure you discuss any questions you have with your health care provider. Document Revised: 09/03/2020 Document Reviewed: 09/03/2020 Elsevier Patient Education  [employer].

## 2022-06-19 LAB — VARICELLA ZOSTER ANTIBODY, IGM: Varicella Zoster Ab IgM: <=0.9 (ref <=0.90)

## 2022-07-09 ENCOUNTER — Other Ambulatory Visit: Payer: Self-pay | Admitting: Family Medicine

## 2022-08-21 ENCOUNTER — Encounter: Payer: Self-pay | Admitting: Gastroenterology

## 2022-08-25 ENCOUNTER — Encounter: Payer: Self-pay | Admitting: Family Medicine

## 2022-08-25 ENCOUNTER — Ambulatory Visit (INDEPENDENT_AMBULATORY_CARE_PROVIDER_SITE_OTHER): Payer: BC Managed Care – PPO | Admitting: Family Medicine

## 2022-08-25 VITALS — BP 138/76 | HR 72 | Temp 98.1°F | Ht 71.0 in | Wt 201.6 lb

## 2022-08-25 DIAGNOSIS — E538 Deficiency of other specified B group vitamins: Secondary | ICD-10-CM

## 2022-08-25 DIAGNOSIS — Z23 Encounter for immunization: Secondary | ICD-10-CM

## 2022-08-25 DIAGNOSIS — Z125 Encounter for screening for malignant neoplasm of prostate: Secondary | ICD-10-CM | POA: Diagnosis not present

## 2022-08-25 DIAGNOSIS — Z0000 Encounter for general adult medical examination without abnormal findings: Secondary | ICD-10-CM

## 2022-08-25 DIAGNOSIS — E785 Hyperlipidemia, unspecified: Secondary | ICD-10-CM | POA: Diagnosis not present

## 2022-08-25 DIAGNOSIS — I700 Atherosclerosis of aorta: Secondary | ICD-10-CM

## 2022-08-25 LAB — CBC WITH DIFFERENTIAL/PLATELET
Basophils Absolute: 0 10*3/uL (ref 0.0–0.1)
Basophils Relative: 0.5 % (ref 0.0–3.0)
Eosinophils Absolute: 0.1 10*3/uL (ref 0.0–0.7)
Eosinophils Relative: 1.3 % (ref 0.0–5.0)
HCT: 44.5 % (ref 39.0–52.0)
Hemoglobin: 15.4 g/dL (ref 13.0–17.0)
Lymphocytes Relative: 24.3 % (ref 12.0–46.0)
Lymphs Abs: 1.2 10*3/uL (ref 0.7–4.0)
MCHC: 34.7 g/dL (ref 30.0–36.0)
MCV: 90.3 fl (ref 78.0–100.0)
Monocytes Absolute: 0.6 10*3/uL (ref 0.1–1.0)
Monocytes Relative: 11.6 % (ref 3.0–12.0)
Neutro Abs: 3 10*3/uL (ref 1.4–7.7)
Neutrophils Relative %: 62.3 % (ref 43.0–77.0)
Platelets: 215 10*3/uL (ref 150.0–400.0)
RBC: 4.93 Mil/uL (ref 4.22–5.81)
RDW: 13.2 % (ref 11.5–15.5)
WBC: 4.8 10*3/uL (ref 4.0–10.5)

## 2022-08-25 LAB — COMPREHENSIVE METABOLIC PANEL
ALT: 55 U/L — ABNORMAL HIGH (ref 0–53)
AST: 30 U/L (ref 0–37)
Albumin: 4.5 g/dL (ref 3.5–5.2)
Alkaline Phosphatase: 79 U/L (ref 39–117)
BUN: 19 mg/dL (ref 6–23)
CO2: 29 mEq/L (ref 19–32)
Calcium: 9.2 mg/dL (ref 8.4–10.5)
Chloride: 103 mEq/L (ref 96–112)
Creatinine, Ser: 0.88 mg/dL (ref 0.40–1.50)
GFR: 95.11 mL/min (ref >60.00)
Glucose, Bld: 101 mg/dL — ABNORMAL HIGH (ref 70–99)
Potassium: 3.8 mEq/L (ref 3.5–5.1)
Sodium: 140 mEq/L (ref 135–145)
Total Bilirubin: 0.9 mg/dL (ref 0.2–1.2)
Total Protein: 6.5 g/dL (ref 6.0–8.3)

## 2022-08-25 LAB — LIPID PANEL
Cholesterol: 194 mg/dL (ref 0–200)
HDL: 48.2 mg/dL (ref >39.00)
LDL Cholesterol: 107 mg/dL — ABNORMAL HIGH (ref 0–99)
NonHDL: 145.41
Total CHOL/HDL Ratio: 4
Triglycerides: 191 mg/dL — ABNORMAL HIGH (ref 0.0–149.0)
VLDL: 38.2 mg/dL (ref 0.0–40.0)

## 2022-08-25 LAB — PSA: PSA: 1.53 ng/mL (ref 0.10–4.00)

## 2022-08-25 LAB — VITAMIN B12: Vitamin B-12: 244 pg/mL (ref 211–911)

## 2022-08-25 MED ORDER — CLONAZEPAM 0.5 MG PO TABS: 0.5000 mg | ORAL_TABLET | Freq: Every evening | ORAL | 5 refills | Status: DC | PRN

## 2022-08-25 NOTE — Patient Instructions (Addendum)
Flu shot today  Cholesterol slightly high considering plaque on aorta- lets work on mild weight loss each year and improving diet- see handout to help with your longterm health. We could increase meds but honestly pushing on lifestyle will be healthier for you in so many ways -consider meal prepping since your schedule is making cooking during week more difficult  [facility] contact- ask them about trouble swallowing- they may require visit but hoping they can set up EGD same time with trouble swallowing Please call to schedule visit and/or procedure Address: [address], [place], [place] [address] Phone: [phone]  Please stop by lab before you go If you have mychart- we will send your results within 3 business days of [place] receiving them.  If you do not have mychart- we will call you about results within 5 business days of [place] receiving them.  *please also note that you will see labs on mychart as soon as they post. I will later go in and write notes on them- will say "notes from Dr. Yong Channel"   Recommended follow up: Return in about 1 year (around 08/26/2023) for physical or sooner if needed.Schedule b4 you leave.

## 2022-08-25 NOTE — Progress Notes (Signed)
Phone: [phone]   Subjective:  Patient presents today for their annual physical. Chief complaint-noted.   See problem oriented charting- ROS- full  review of systems was completed and negative  except for: a few itchy papules left after chicken pox most likely in September, ongoing back pain, nocturia stable  The following were reviewed and entered/updated in epic: Past Medical History:  Diagnosis Date   Allergy    Blood transfusion without reported diagnosis    Cough variant asthma 10/12/2007   no per pt 06-19-16   Elevated LFTs    Esophageal reflux    GERD (gastroesophageal reflux disease)    Herniated nucleus pulposus, L3-4 left 06/20/2019   Hiatal hernia    Hyperlipidemia    Irritable bowel syndrome    LATERAL EPICONDYLITIS 03/07/2010   Qualifier: Diagnosis of  By: Elease Hashimoto MD, Bruce  left shoulder arthrits per pt 06-19-16   Liver lesion    Lumbar radiculopathy 06/20/2019   Lung mass    Personal history of colonic polyps 09/28/2009    ADENOMATOUS POLYP   PONV (postoperative nausea and vomiting)    Schatzki's ring    Vitamin B 12 deficiency    Patient Active Problem List   Diagnosis Date Noted   Herniated nucleus pulposus, L3-4 left 06/20/2019    Priority: High   History of small bowel obstruction 04/11/2016    Priority: High   Aortic atherosclerosis ([place]) 08/25/2022    Priority: Medium    Pulmonary nodules 06/03/2016    Priority: Medium    Insomnia 05/10/2015    Priority: Medium    Essential hypertension 05/10/2015    Priority: Medium    Hyperlipidemia 05/12/2007    Priority: Medium    GERD 05/12/2007    Priority: Medium    Hemangioma of liver 11/24/2017    Priority: Low   Gallstones 05/10/2015    Priority: Low   History of colonic polyps 10/03/2010    Priority: Low   B12 deficiency 09/05/2009    Priority: Low   IBS 08/31/2009    Priority: Low   ALLERGIC RHINITIS, SEASONAL 07/24/2009    Priority: Low   External hemorrhoids 06/07/2008    Priority:  Low   Past Surgical History:  Procedure Laterality Date   BACK SURGERY  07/12/2019   Dr.Stern   COLONOSCOPY  07/03/2016   Exploration Laparotomy and Small Bowel resection     LEG SURGERY     left   LUMBAR LAMINECTOMY     x3 last one June 2017   microdisctomy  07/12/2019   L3-L4   POLYPECTOMY     UPPER GASTROINTESTINAL ENDOSCOPY  07/03/2016   VENTRAL HERNIA REPAIR     VIDEO BRONCHOSCOPY Bilateral 06/10/2016   Procedure: VIDEO BRONCHOSCOPY WITH FLUORO;  Surgeon: Marshell Garfinkel, MD;  Location: [facility];  Service: Cardiopulmonary;  Laterality: Bilateral;   VIDEO BRONCHOSCOPY WITH ENDOBRONCHIAL ULTRASOUND N/A 09/08/2016   Procedure: VIDEO BRONCHOSCOPY WITH ENDOBRONCHIAL ULTRASOUND;  Surgeon: Marshell Garfinkel, MD;  Location: [facility];  Service: Pulmonary;  Laterality: N/A;    Family History  Problem Relation Age of Onset   Breast cancer Mother    Thyroid cancer Mother    Healthy Father    Colon cancer Neg Hx    Esophageal cancer Neg Hx    Rectal cancer Neg Hx    Stomach cancer Neg Hx    Colon polyps Neg Hx     Medications- reviewed and updated Current Outpatient Medications  Medication Sig Dispense Refill   amLODipine (NORVASC) 2.5 MG  tablet TAKE 1 TABLET BY MOUTH EVERY DAY 90 tablet 1   atorvastatin (LIPITOR) 10 MG tablet TAKE 1 TABLET BY MOUTH EVERY DAY 90 tablet 3   cyanocobalamin (,VITAMIN B-12,) 1000 MCG/ML injection INJECT 1 ML (1,000 MCG TOTAL) INTO THE SKIN EVERY 30 (THIRTY) DAYS. 3 mL 1   dexlansoprazole (DEXILANT) 60 MG capsule Take 1 capsule (60 mg total) by mouth daily. 30 capsule 5   gabapentin (NEURONTIN) 300 MG capsule   0   omeprazole (PRILOSEC) 20 MG capsule TAKE 1 CAPSULE BY MOUTH EVERY DAY 90 capsule 3   clonazePAM (KLONOPIN) 0.5 MG tablet Take 1 tablet (0.5 mg total) by mouth at bedtime as needed. for sleep 30 tablet 5   No current facility-administered medications for this visit.    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 Oropharynx normal Neck: no thyromegaly CV: RRR no murmurs rubs or gallops Lungs: CTAB no crackles, wheeze, rhonchi Abdomen: soft/nontender/nondistended/normal bowel sounds. No rebound or guarding.  Ext: no edema Skin: warm, dry Neuro: grossly normal, moves all extremities, PERRLA   Assessment and Plan  56 y.o. male presenting for annual physical.  Health Maintenance counseling: 1. Anticipatory guidance: Patient counseled regarding regular dental exams -q6 months, eye exams -yearly,  avoiding smoking and second hand smoke , limiting alcohol to 2 beverages per day, no illicit drugs .   2. Risk factor reduction:  Advised patient of need for regular exercise and diet rich and fruits and vegetables to reduce risk of heart attack and stroke.  Exercise- 14-15 k steps a day.  Diet/weight management-weight up 5 lbs from last cpe- schedule has pushed him to eat later with work and has been more challenging - has not been able to cook and wife has not cooked in place of that- has to end up getting more fast food. Discussed meal prepping. Protein shake for breakfast- helps with appetite.  Wt Readings from Last 3 Encounters:  08/25/22 201 lb 9.6 oz (91.4 kg)  06/17/22 195 lb 9.6 oz (88.7 kg)  02/10/22 200 lb (90.7 kg)  3. Immunizations/screenings/ancillary studies- flu shot today, final shingrix  -not quite due- can schedule nurse visit for this. 1st one didn't bother him. Immunization History  Administered Date(s) Administered   Influenza Inj Mdck Quad With Preservative 07/29/2019   Influenza Split 07/14/2011   Influenza,inj,Quad PF,6+  Mos 06/27/2013, 07/30/2015, 06/17/2016, 08/19/2017, 06/24/2018, 08/20/2020   PFIZER(Purple Top)SARS-COV-2 Vaccination 04/26/2020, 05/17/2020   Td 06/23/1999, 08/25/2008   Tdap 06/21/2016   Zoster Recombinat (Shingrix) 07/11/2022  4. Prostate cancer screening-  low risk prior psa trend- update today, nocturia stable once a night  Lab Results  Component Value Date   PSA 1.45 08/22/2021   PSA 1.25 08/20/2020   PSA 1.27 08/17/2019   5. Colon cancer screening - Colonoscopy October 14, 2019 with 3-year repeat planned due to adenomatous colon polyp history- will be due in january 6. Skin cancer screening- referred to derm last year- office closed but he scheduled in February (wife set up).  advised regular sunscreen use. Denies worrisome, changing, or new skin lesions- other than possible chicken pox spots 7. Smoking associated screening (lung cancer screening, AAA screen 65-75, UA)- never smoker 8. STD screening - only active with wife  Status of chronic or acute concerns   #Low back pain-history of neurosurgery Dr. Vertell Limber July 12, 2019 with microdiscectomy L3-L4.  Patient remains on gabapentin '300mg'$  through neurosurgery- sees every 6 months or so  #hypertension S: medication: Amlodipine 2.5 mg Home readings #s: 120/75 typically BP Readings from Last 3 Encounters:  08/25/22 138/76  06/17/22 (!) 154/86  02/10/22 138/90  A/P: slight white coat element- pressures look great at home though. Continue current medications    #hyperlipidemia- peak LDL 191 with aortic atherosclerosis S: Medication:Atorvastatin 10 mg Lab Results  Component Value Date   CHOL 193 08/22/2021   HDL 53.30 08/22/2021   LDLCALC 116 (H) 08/22/2021   LDLDIRECT 145.0 07/22/2016   TRIG 119.0 08/22/2021   CHOLHDL 4 08/22/2021  A/P: cholesterol above ideal goal on one hand but on other hand phenomenal improvement from peak- as long as at least under 130- continue current meds- discussed pushing for lifestyle changes- gave  copy mediterranean diet   # GERD S:Medication: On Dexilant  60 mg with worsening in 2023 but was able to go back down to omeprazole 20 mg daily- sometimes needs twice. Is mild issues with food getting stuck- has needed dilation in the past- no liquids getting stuck A/P: GERD reasonable control but some dysphagia with food- he will call GI and see if endoscopy also possible- encouraged to eat slow    #B12 deficiency S: Patient receives monthly B12 injections at home.  Nephew is an Science writer  Component Value Date   ZJIRCVEL38 101 08/22/2021  A/P: hopefully stable- update b12 today. Continue current meds for now   # Insomnia- will either use gabapentin or clonazepam before bed and that's helpful . Last refill on clonazepam was in February- using sparingly. Knows to avoid driving 8 hours after use  #chicken pox most likely back in September though test was negative. Had just gotten back from [place] and heard later that there was chicken pox outbreak. Very mild case as child. Has a few lesions still present (papules) that do itch some but dont hurt -got first shingrix after healed up (see above)  Recommended follow up: Return in about 1 year (around 08/26/2023) for physical or sooner if needed.Schedule b4 you leave.  Lab/Order associations: fasting   ICD-10-CM   1. Preventative health care  Z00.00 CBC with Differential/Platelet    Comprehensive metabolic panel    Lipid panel    PSA    Vitamin B12    2. Hyperlipidemia, unspecified hyperlipidemia type  E78.5 CBC with Differential/Platelet    Comprehensive metabolic panel    Lipid panel    3. B12 deficiency  E53.8 Vitamin B12    4. Screening for prostate cancer  Z12.5 PSA    5. Aortic atherosclerosis (HCC)  I70.0       Meds ordered this encounter  Medications   clonazePAM (KLONOPIN) 0.5 MG tablet    Sig: Take 1 tablet (0.5 mg total) by mouth at bedtime as needed. for sleep    Dispense:  30 tablet    Refill:  5    Return  precautions advised.  Garret Reddish, MD

## 2022-08-25 NOTE — Addendum Note (Signed)
Addended by: Marin Olp on: 08/25/2022 08:33 AM   Modules accepted: Level of Service

## 2022-10-04 ENCOUNTER — Other Ambulatory Visit: Payer: Self-pay | Admitting: Family Medicine

## 2022-10-29 DIAGNOSIS — L578 Other skin changes due to chronic exposure to nonionizing radiation: Secondary | ICD-10-CM | POA: Diagnosis not present

## 2022-10-29 DIAGNOSIS — D1801 Hemangioma of skin and subcutaneous tissue: Secondary | ICD-10-CM | POA: Diagnosis not present

## 2022-10-29 DIAGNOSIS — L821 Other seborrheic keratosis: Secondary | ICD-10-CM | POA: Diagnosis not present

## 2022-10-29 DIAGNOSIS — L814 Other melanin hyperpigmentation: Secondary | ICD-10-CM | POA: Diagnosis not present

## 2022-12-01 ENCOUNTER — Ambulatory Visit (AMBULATORY_SURGERY_CENTER): Payer: BC Managed Care – PPO

## 2022-12-01 ENCOUNTER — Encounter: Payer: Self-pay | Admitting: Gastroenterology

## 2022-12-01 VITALS — Ht 71.0 in | Wt 200.0 lb

## 2022-12-01 DIAGNOSIS — Z86010 Personal history of colonic polyps: Secondary | ICD-10-CM

## 2022-12-01 MED ORDER — NA SULFATE-K SULFATE-MG SULF 17.5-3.13-1.6 GM/177ML PO SOLN: 1.0000 | Freq: Once | ORAL | 0 refills | Status: AC

## 2022-12-01 NOTE — Progress Notes (Signed)
Pre visit completed via phone call; Patient verified name, DOB, and address;  No egg or soy allergy known to patient;  No issues known to pt with past sedation with any surgeries or procedures-----other than PONV Patient denies ever being told they had issues or difficulty with intubation----other than PONV  No FH of Malignant Hyperthermia; Pt is not on diet pills; Pt is not on home 02;  Pt is not on blood thinners;  Pt denies issues with constipation currently; uses Miralax sometimes when he feels like he is getting constipated;  No A fib or A flutter; Have any cardiac testing pending--NO Pt instructed to use Singlecare.com or GoodRx for a price reduction on prep;   Insurance verified during [place] appt=[employer]  Patient's chart reviewed by Osvaldo Angst CNRA prior to previsit and patient appropriate for the [facility].  Previsit completed and red dot placed by patient's name on their procedure day (on provider's schedule);    Patient requested instructions be sent to MyChart;

## 2023-01-02 ENCOUNTER — Ambulatory Visit (AMBULATORY_SURGERY_CENTER): Payer: BC Managed Care – PPO | Admitting: Gastroenterology

## 2023-01-02 ENCOUNTER — Encounter: Payer: Self-pay | Admitting: Gastroenterology

## 2023-01-02 VITALS — BP 120/64 | HR 62 | Temp 98.6°F | Resp 13 | Ht 71.0 in | Wt 200.0 lb

## 2023-01-02 DIAGNOSIS — D122 Benign neoplasm of ascending colon: Secondary | ICD-10-CM

## 2023-01-02 DIAGNOSIS — Z1211 Encounter for screening for malignant neoplasm of colon: Secondary | ICD-10-CM | POA: Diagnosis not present

## 2023-01-02 DIAGNOSIS — D120 Benign neoplasm of cecum: Secondary | ICD-10-CM

## 2023-01-02 DIAGNOSIS — Z86010 Personal history of colonic polyps: Secondary | ICD-10-CM | POA: Diagnosis not present

## 2023-01-02 DIAGNOSIS — D125 Benign neoplasm of sigmoid colon: Secondary | ICD-10-CM

## 2023-01-02 DIAGNOSIS — D123 Benign neoplasm of transverse colon: Secondary | ICD-10-CM | POA: Diagnosis not present

## 2023-01-02 DIAGNOSIS — Z09 Encounter for follow-up examination after completed treatment for conditions other than malignant neoplasm: Secondary | ICD-10-CM | POA: Diagnosis not present

## 2023-01-02 MED ORDER — SODIUM CHLORIDE 0.9 % IV SOLN
500.0000 mL | INTRAVENOUS | Status: DC
Start: 2023-01-02 — End: 2023-01-02

## 2023-01-02 NOTE — Progress Notes (Signed)
Report to PACU, RN, vss, BBS= Clear.  

## 2023-01-02 NOTE — Op Note (Signed)
[facility] Patient Name: Bradley [name] Procedure Date: 01/02/2023 10:47 AM MRN: [id number] Endoscopist: Napoleon Form , MD, [id number] Age: 57 Referring MD:  Date of Birth: [birth year] Gender: Male Account #: [id number] Procedure:                Colonoscopy Indications:              High risk colon cancer surveillance: Personal                            history of colonic polyps, High risk colon cancer                            surveillance: Personal history of multiple (3 or                            more) adenomas, High risk colon cancer                            surveillance: Personal history of adenoma less than                            10 mm in size Medicines:                Monitored Anesthesia Care Procedure:                Pre-Anesthesia Assessment:                           - Prior to the procedure, a History and Physical                            was performed, and patient medications and                            allergies were reviewed. The patient's tolerance of                            previous anesthesia was also reviewed. The risks                            and benefits of the procedure and the sedation                            options and risks were discussed with the patient.                            All questions were answered, and informed consent                            was obtained. Prior Anticoagulants: The patient has                            taken no anticoagulant or antiplatelet agents. ASA  Grade Assessment: II - A patient with mild systemic                            disease. After reviewing the risks and benefits,                            the patient was deemed in satisfactory condition to                            undergo the procedure.                           After obtaining informed consent, the colonoscope                            was passed under direct vision. Throughout the                             procedure, the patient's blood pressure, pulse, and                            oxygen saturations were monitored continuously. The                            PCF-HQ190L Colonoscope [id number] was introduced                            through the anus and advanced to the the cecum,                            identified by appendiceal orifice and ileocecal                            valve. The colonoscopy was performed without                            difficulty. The patient tolerated the procedure                            well. The quality of the bowel preparation was                            adequate to identify polyps. The ileocecal valve,                            appendiceal orifice, and rectum were photographed. Scope In: 10:58:09 AM Scope Out: 11:30:56 AM Scope Withdrawal Time: 0 hours 24 minutes 47 seconds  Total Procedure Duration: 0 hours 32 minutes 47 seconds  Findings:                 The perianal and digital rectal examinations were                            normal.  Five sessile polyps were found in the sigmoid                            colon, transverse colon, ascending colon and cecum.                            The polyps were 3 to 7 mm in size. These polyps                            were removed with a cold snare. Resection and                            retrieval were complete.                           A few small-mouthed diverticula were found in the                            sigmoid colon.                           Non-bleeding external and internal hemorrhoids were                            found during retroflexion. The hemorrhoids were                            medium-sized. Complications:            No immediate complications. Estimated Blood Loss:     Estimated blood loss was minimal. Impression:               - Five 3 to 7 mm polyps in the sigmoid colon, in                            the transverse colon, in  the ascending colon and in                            the cecum, removed with a cold snare. Resected and                            retrieved.                           - Diverticulosis in the sigmoid colon.                           - Non-bleeding external and internal hemorrhoids. Recommendation:           - Patient has a contact number available for                            emergencies. The signs and symptoms of potential  delayed complications were discussed with the                            patient. Return to normal activities tomorrow.                            Written discharge instructions were provided to the                            patient.                           - Resume previous diet.                           - Continue present medications.                           - Await pathology results.                           - Repeat colonoscopy in 3 years for surveillance                            based on pathology results. Napoleon Form, MD 01/02/2023 11:37:09 AM This report has been signed electronically.

## 2023-01-02 NOTE — Progress Notes (Signed)
Pt's states no medical or surgical changes since previsit or office visit. 

## 2023-01-02 NOTE — Patient Instructions (Addendum)
Handouts Provided:  Polyps and Diverticulosis  YOU HAD AN ENDOSCOPIC PROCEDURE TODAY AT THE [facility]:   Refer to the procedure report that was given to you for any specific questions about what was found during the examination.  If the procedure report does not answer your questions, please call your gastroenterologist to clarify.  If you requested that your care partner not be given the details of your procedure findings, then the procedure report has been included in a sealed envelope for you to review at your convenience later.  YOU SHOULD EXPECT: Some feelings of bloating in the abdomen. Passage of more gas than usual.  Walking can help get rid of the air that was put into your GI tract during the procedure and reduce the bloating. If you had a lower endoscopy (such as a colonoscopy or flexible sigmoidoscopy) you may notice spotting of blood in your stool or on the toilet paper. If you underwent a bowel prep for your procedure, you may not have a normal bowel movement for a few days.  Please Note:  You might notice some irritation and congestion in your nose or some drainage.  This is from the oxygen used during your procedure.  There is no need for concern and it should clear up in a day or so.  SYMPTOMS TO REPORT IMMEDIATELY:  Following lower endoscopy (colonoscopy or flexible sigmoidoscopy):  Excessive amounts of blood in the stool  Significant tenderness or worsening of abdominal pains  Swelling of the abdomen that is new, acute  Fever of 100F or higher  For urgent or emergent issues, a gastroenterologist can be reached at any hour by calling [phone]. Do not use MyChart messaging for urgent concerns.    DIET:  We do recommend a small meal at first, but then you may proceed to your regular diet.  Drink plenty of fluids but you should avoid alcoholic beverages for 24 hours.  ACTIVITY:  You should plan to take it easy for the rest of today and you should NOT DRIVE  or use heavy machinery until tomorrow (because of the sedation medicines used during the test).    FOLLOW UP: Our staff will call the number listed on your records the next business day following your procedure.  We will call around 7:15- 8:00 am to check on you and address any questions or concerns that you may have regarding the information given to you following your procedure. If we do not reach you, we will leave a message.     If any biopsies were taken you will be contacted by phone or by letter within the next 1-3 weeks.  Please call us at [phone] if you have not heard about the biopsies in 3 weeks.    SIGNATURES/CONFIDENTIALITY: You and/or your care partner have signed paperwork which will be entered into your electronic medical record.  These signatures attest to the fact that that the information above on your After Visit Summary has been reviewed and is understood.  Full responsibility of the confidentiality of this discharge information lies with you and/or your care-partner.  

## 2023-01-02 NOTE — Progress Notes (Signed)
[facility] History and Physical   Primary Care Physician:  Shelva Majestic, MD   Reason for Procedure:  History of adenomatous colon polyps  Plan:    Surveillance colonoscopy with possible interventions as needed     HPI: Bradley [name] is a very pleasant 57 y.o. male here for surveillance colonoscopy. Denies any nausea, vomiting, abdominal pain, melena or bright red blood per rectum  The risks and benefits as well as alternatives of endoscopic procedure(s) have been discussed and reviewed. All questions answered. The patient agrees to proceed.    Past Medical History:  Diagnosis Date   Allergy    Blood transfusion without reported diagnosis    Cough variant asthma 10/12/2007   no per pt 06-19-16   Elevated LFTs    Esophageal reflux    on meds   GERD (gastroesophageal reflux disease)    Herniated nucleus pulposus, L3-4 left 06/20/2019   Hiatal hernia    Hyperlipidemia    Hypertension    on meds   Irritable bowel syndrome    LATERAL EPICONDYLITIS 03/07/2010   Qualifier: Diagnosis of  By: Caryl Never MD, Bruce  left shoulder arthrits per pt 06-19-16   Liver lesion    Lumbar radiculopathy 06/20/2019   Lung mass    Personal history of colonic polyps 09/28/2009    ADENOMATOUS POLYP   PONV (postoperative nausea and vomiting)    Schatzki's ring    Vitamin B 12 deficiency     Past Surgical History:  Procedure Laterality Date   BACK SURGERY  07/12/2019   Dr.Stern   COLONOSCOPY  07/03/2016   COLONOSCOPY  2021   KN-MAC-suprep(exc)-TA-3 yr recall   Exploration Laparotomy and Small Bowel resection     LEG SURGERY     left   LUMBAR LAMINECTOMY     x3 last one June 2017   microdisctomy  07/12/2019   L3-L4   POLYPECTOMY     UPPER GASTROINTESTINAL ENDOSCOPY  07/03/2016   VENTRAL HERNIA REPAIR     VIDEO BRONCHOSCOPY Bilateral 06/10/2016   Procedure: VIDEO BRONCHOSCOPY WITH FLUORO;  Surgeon: Chilton Greathouse, MD;  Location: [facility];  Service:  Cardiopulmonary;  Laterality: Bilateral;   VIDEO BRONCHOSCOPY WITH ENDOBRONCHIAL ULTRASOUND N/A 09/08/2016   Procedure: VIDEO BRONCHOSCOPY WITH ENDOBRONCHIAL ULTRASOUND;  Surgeon: Chilton Greathouse, MD;  Location: [facility] OR;  Service: Pulmonary;  Laterality: N/A;    Prior to Admission medications   Medication Sig Start Date End Date Taking? Authorizing Provider  amLODipine (NORVASC) 2.5 MG tablet TAKE 1 TABLET BY MOUTH EVERY DAY 10/06/22  Yes Shelva Majestic, MD  atorvastatin (LIPITOR) 10 MG tablet TAKE 1 TABLET BY MOUTH EVERY DAY 03/31/22  Yes Shelva Majestic, MD  gabapentin (NEURONTIN) 300 MG capsule Take 300 mg by mouth daily as needed. 12/22/17  Yes [provider]  omeprazole (PRILOSEC) 20 MG capsule TAKE 1 CAPSULE BY MOUTH EVERY DAY 10/06/22  Yes Shelva Majestic, MD  clonazePAM (KLONOPIN) 0.5 MG tablet Take 1 tablet (0.5 mg total) by mouth at bedtime as needed. for sleep 08/25/22   Shelva Majestic, MD  [employer] SUNSCREEN SPF 30 EX Apply 1 application  topically daily at 6 (six) AM.    [provider]  cyanocobalamin (VITAMIN B12) 1000 MCG/ML injection INJECT 1 ML (1,000 MCG TOTAL) INTO THE SKIN EVERY 30 (THIRTY) DAYS. 10/06/22   Shelva Majestic, MD  bisoprolol-hydrochlorothiazide [facility]) 2.5-6.25 MG per tablet Take 1 tablet by mouth daily. 10/28/11 11/28/11  Stacie Glaze, MD  Current Outpatient Medications  Medication Sig Dispense Refill   amLODipine (NORVASC) 2.5 MG tablet TAKE 1 TABLET BY MOUTH EVERY DAY 90 tablet 1   atorvastatin (LIPITOR) 10 MG tablet TAKE 1 TABLET BY MOUTH EVERY DAY 90 tablet 3   gabapentin (NEURONTIN) 300 MG capsule Take 300 mg by mouth daily as needed.  0   omeprazole (PRILOSEC) 20 MG capsule TAKE 1 CAPSULE BY MOUTH EVERY DAY 90 capsule 3   clonazePAM (KLONOPIN) 0.5 MG tablet Take 1 tablet (0.5 mg total) by mouth at bedtime as needed. for sleep 30 tablet 5   [employer] SUNSCREEN SPF 30 EX Apply 1 application  topically daily at 6 (six) AM.     cyanocobalamin  (VITAMIN B12) 1000 MCG/ML injection INJECT 1 ML (1,000 MCG TOTAL) INTO THE SKIN EVERY 30 (THIRTY) DAYS. 3 mL 1   Current Facility-Administered Medications  Medication Dose Route Frequency Provider Last Rate Last Admin   0.9 %  sodium chloride infusion  500 mL Intravenous Continuous Ting Cage, Eleonore Chiquito, MD        Allergies as of 01/02/2023   (No Known Allergies)    Family History  Problem Relation Age of Onset   Breast cancer Mother    Thyroid cancer Mother    Healthy Father    Colon cancer Neg Hx    Esophageal cancer Neg Hx    Rectal cancer Neg Hx    Stomach cancer Neg Hx    Colon polyps Neg Hx     Social History   Socioeconomic History   Marital status: Married    Spouse name: Not on file   Number of children: 2   Years of education: Not on file   Highest education level: Not on file  Occupational History   Occupation: ELECTRICIAN    Employer: [employer]  Tobacco Use   Smoking status: Never   Smokeless tobacco: Never  Vaping Use   Vaping Use: Never used  Substance and Sexual Activity   Alcohol use: Yes    Alcohol/week: 0.0 - 4.0 standard drinks of alcohol    Comment: 3-4 times per month per pt   Drug use: No   Sexual activity: Not on file  Other Topics Concern   Not on file  Social History Narrative   Married, lives with wife and son      OCCUPATION: Games developer, 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  Physical Exam: Vital signs in last 24 hours: Blood Pressure (Abnormal) 152/85   Pulse 77   Temperature 98.6 F (37 C) (Temporal)   Respiration (Abnormal) 9   Height  (1.803 m)    Weight 200 lb (90.7 kg)   Oxygen Saturation 98%   Body Mass Index 27.89 kg/m  General:   Alert, NAD Lungs:  Clear .   Heart:  Regular rate and rhythm Abdomen:  Soft, nontender and nondistended. Neuro/Psych:  Alert and cooperative. Normal mood and affect. A and O x 3  Reviewed labs, radiology imaging, old records and pertinent past GI work up  Patient is appropriate for planned procedure(s) and anesthesia in an ambulatory  setting   Damaris Hippo , MD [phone]

## 2023-01-02 NOTE — Progress Notes (Signed)
Called to room to assist during endoscopic procedure.  Patient ID and intended procedure confirmed with present staff. Received instructions for my participation in the procedure from the performing physician.  

## 2023-01-05 ENCOUNTER — Telehealth: Payer: Self-pay

## 2023-01-05 NOTE — Telephone Encounter (Signed)
Left message on follow up call. 

## 2023-01-13 ENCOUNTER — Encounter: Payer: Self-pay | Admitting: Gastroenterology

## 2023-02-10 ENCOUNTER — Ambulatory Visit: Payer: BC Managed Care – PPO | Admitting: Family Medicine

## 2023-02-10 ENCOUNTER — Encounter: Payer: Self-pay | Admitting: Family Medicine

## 2023-02-10 VITALS — BP 137/86 | HR 72 | Temp 97.3°F | Ht 71.0 in | Wt 201.6 lb

## 2023-02-10 DIAGNOSIS — I10 Essential (primary) hypertension: Secondary | ICD-10-CM

## 2023-02-10 DIAGNOSIS — K5791 Diverticulosis of intestine, part unspecified, without perforation or abscess with bleeding: Secondary | ICD-10-CM

## 2023-02-10 NOTE — Patient Instructions (Addendum)
It was very nice to see you today!  I think you probably had a mild case of diverticulitis. Hopefully this will continue to improve over the next few days.  Let us know if symptoms do not continue to improve or if you have any worsening symptoms and we can check a CT scan at that time.  Take care, Dr Jimmey Ralph  PLEASE NOTE:  If you had any lab tests, please let us know if you have not heard back within a few days. You may see your results on mychart before we have a chance to review them but we will give you a call once they are reviewed by [place].   If we ordered any referrals today, please let us know if you have not heard from their office within the next week.   If you had any urgent prescriptions sent in today, please check with the pharmacy within an hour of our visit to make sure the prescription was transmitted appropriately.   Please try these tips to maintain a healthy lifestyle:  Eat at least 3 REAL meals and 1-2 snacks per day.  Aim for no more than 5 hours between eating.  If you eat breakfast, please do so within one hour of getting up.   Each meal should contain half fruits/vegetables, one quarter protein, and one quarter carbs (no bigger than a computer mouse)  Cut down on sweet beverages. This includes juice, soda, and sweet tea.   Drink at least 1 glass of water with each meal and aim for at least 8 glasses per day  Exercise at least 150 minutes every week.

## 2023-02-10 NOTE — Progress Notes (Signed)
   Bradley [name] is a 57 y.o. male who presents today for an office visit.  Assessment/Plan:  New/Acute Problems: Abdominal Pain No red flags.  No peritoneal signs.  Based on exam and history likely has a mild case of diverticulitis.  It is reassuring that symptoms have began to improve over the last couple of days.  We did discuss checking a CT scan or empirically starting antibiotics however given that symptoms have started to improve would be reasonable for [place] to hold off on this for now.  He does have an extensive history of intra-abdominal surgeries and complications including small bowel obstruction and known cholelithiasis however his history and exam are not consistent with either of these etiologies.  There is also possible this could have been a case of mild gastroenteritis however would not expect symptoms for the last for a couple of weeks.  Will continue with expectant management for now.  He will let us know if not improving in the next couple days and we can check a CT scan at that time.  May also consider stool studies if still has persistent diarrhea.  Encouraged hydration.  We discussed reasons to return to care.  Chronic Problems Addressed Today: Essential Hypertension At goal on amlodipine 2.5 mg daily  Diverticulosis Found on his colonoscopy from last month.  Has never had any issues with diverticulitis in the past though this could be contributing to his above abdominal pain  Cholelithiasis Symptoms not consistent with biliary colic though will need to further evaluate if symptoms persist as above.      Subjective:  HPI:  See Assessment / plan for status of chronic conditions.  His main concern today is diarrhea and abdominal pain for the last 2 weeks. No fevers or chills. No nausea or vomiting. No blood in stool.  Getting a little better. No treatments tied. Some pain after eating. Diarrhea usually happens about 30 minutes after eating as well.  He had colonoscopy about  a month ago and had a few polyps removed.       Objective:  Physical Exam: BP 137/86   Pulse 72   Temp (!) 97.3 F (36.3 C) (Temporal)   Ht 5\' 11"  (1.803 m)   Wt 201 lb 9.6 oz (91.4 kg)   SpO2 98%   BMI 28.12 kg/m   Gen: No acute distress, resting comfortably CV: Regular rate and rhythm with no murmurs appreciated Pulm: Normal work of breathing, clear to auscultation bilaterally with no crackles, wheezes, or rhonchi Abdomen: Soft, nondistended.  Tenderness palpation and left lower quadrant.  Mild guarding.  No rebound tenderness. Neuro: Grossly normal, moves all extremities Psych: Normal affect and thought content      Machelle Raybon M. Jimmey Ralph, MD 02/10/2023 8:09 AM

## 2023-02-19 ENCOUNTER — Ambulatory Visit
Admission: EM | Admit: 2023-02-19 | Discharge: 2023-02-19 | Disposition: A | Discharge status: Home / Self Care | Payer: BC Managed Care – PPO | Attending: Nurse Practitioner | Admitting: Nurse Practitioner

## 2023-02-19 DIAGNOSIS — M7021 Olecranon bursitis, right elbow: Secondary | ICD-10-CM

## 2023-02-19 MED ORDER — PREDNISONE 50 MG PO TABS: ORAL_TABLET | ORAL | 0 refills | Status: DC

## 2023-02-19 MED ORDER — DEXAMETHASONE SODIUM PHOSPHATE 10 MG/ML IJ SOLN
10.0000 mg | INTRAMUSCULAR | Status: AC
  Administered 2023-02-19: 10 mg via INTRAMUSCULAR

## 2023-02-19 NOTE — ED Triage Notes (Signed)
Pt reports to UC with right elbow swelling since this morning. Pt has developed a sac on his elbow as well that is painful that also started this morning. Pt states pain radiates from his right elbow down to his hand.

## 2023-02-19 NOTE — Discharge Instructions (Addendum)
You have been given Decadron 10 mg in the muscle to help with inflammation and swelling of the right elbow.  Start the prednisone on 02/20/2023. Take medication as prescribed. May take over-the-counter Tylenol arthritis strength 650 mg tablets as needed for pain or discomfort while you are taking the prednisone.  When you completes the prednisone, you can take ibuprofen 600 to 800 mg (3 to 4 tablets) every 8 hours as needed for pain or discomfort. Wear the Ace wrap to provide compression to the right elbow. Apply ice to the right elbow to help with pain and swelling.  Apply for 20 minutes, remove for 1 hour, repeat as much as possible. Keep the right arm active to prevent joint immobility. As discussed, if symptoms worsen, the area of the right elbow may need to be drained.  You can follow-up with your primary care physician, or with orthopedics to have this procedure done.  You can also check with this clinic to see if a provider is available to do this.  If you need to follow-up with Ortho, you can call [facility] [place] at [phone] or with [facility] at [phone]. Follow-up as needed.

## 2023-02-19 NOTE — ED Provider Notes (Addendum)
[facility]    CSN: [id number] Arrival date & time: 02/19/23  1850      History   Chief Complaint Chief Complaint  Patient presents with   Elbow Problem    HPI Bradley [name] is a 57 y.o. male.   The history is provided by the patient.   The patient presents for complaints of swelling and pain to the right elbow that started this morning when he woke up.  Patient states that he does not recall any specific injury or trauma, but states that he did bump his elbow on yesterday.  Patient states that he does have some pain that radiates down the outside of the left forearm.  He denies numbness, tingling, decreased range of motion, or upper arm pain.  Patient reports he is right-hand dominant.  Patient states that he has never had symptoms similar to this in the past.  He has not taken any medication for his symptoms.  He states over the course of several hours, his symptoms did worsen.  Past Medical History:  Diagnosis Date   Allergy    Blood transfusion without reported diagnosis    Cough variant asthma 10/12/2007   no per pt 06-19-16   Elevated LFTs    Esophageal reflux    on meds   GERD (gastroesophageal reflux disease)    Herniated nucleus pulposus, L3-4 left 06/20/2019   Hiatal hernia    Hyperlipidemia    Hypertension    on meds   Irritable bowel syndrome    LATERAL EPICONDYLITIS 03/07/2010   Qualifier: Diagnosis of  By: Caryl Never MD, Bruce  left shoulder arthrits per pt 06-19-16   Liver lesion    Lumbar radiculopathy 06/20/2019   Lung mass    Personal history of colonic polyps 09/28/2009    ADENOMATOUS POLYP   PONV (postoperative nausea and vomiting)    Schatzki's ring    Vitamin B 12 deficiency     Patient Active Problem List   Diagnosis Date Noted   Aortic atherosclerosis (HCC) 08/25/2022   Herniated nucleus pulposus, L3-4 left 06/20/2019   Hemangioma of liver 11/24/2017   Pulmonary nodules 06/03/2016   History of small bowel obstruction 04/11/2016    Insomnia 05/10/2015   Gallstones 05/10/2015   Essential hypertension 05/10/2015   History of colonic polyps 10/03/2010   B12 deficiency 09/05/2009   IBS 08/31/2009   ALLERGIC RHINITIS, SEASONAL 07/24/2009   External hemorrhoids 06/07/2008   Hyperlipidemia 05/12/2007   GERD 05/12/2007    Past Surgical History:  Procedure Laterality Date   BACK SURGERY  07/12/2019   Dr.Stern   COLONOSCOPY  07/03/2016   COLONOSCOPY  2021   KN-MAC-suprep(exc)-TA-3 yr recall   Exploration Laparotomy and Small Bowel resection     LEG SURGERY     left   LUMBAR LAMINECTOMY     x3 last one June 2017   microdisctomy  07/12/2019   L3-L4   POLYPECTOMY     UPPER GASTROINTESTINAL ENDOSCOPY  07/03/2016   VENTRAL HERNIA REPAIR     VIDEO BRONCHOSCOPY Bilateral 06/10/2016   Procedure: VIDEO BRONCHOSCOPY WITH FLUORO;  Surgeon: Chilton Greathouse, MD;  Location: [facility];  Service: Cardiopulmonary;  Laterality: Bilateral;   VIDEO BRONCHOSCOPY WITH ENDOBRONCHIAL ULTRASOUND N/A 09/08/2016   Procedure: VIDEO BRONCHOSCOPY WITH ENDOBRONCHIAL ULTRASOUND;  Surgeon: Chilton Greathouse, MD;  Location: [facility];  Service: Pulmonary;  Laterality: N/A;       Home Medications    Prior to Admission medications   Medication Sig Start Date End  Date Taking? Authorizing Provider  amLODipine (NORVASC) 2.5 MG tablet TAKE 1 TABLET BY MOUTH EVERY DAY 10/06/22   Shelva Majestic, MD  atorvastatin (LIPITOR) 10 MG tablet TAKE 1 TABLET BY MOUTH EVERY DAY 03/31/22   Shelva Majestic, MD  clonazePAM (KLONOPIN) 0.5 MG tablet Take 1 tablet (0.5 mg total) by mouth at bedtime as needed. for sleep 08/25/22   Shelva Majestic, MD  [employer] SUNSCREEN SPF 30 EX Apply 1 application  topically daily at 6 (six) AM.    [provider]  cyanocobalamin (VITAMIN B12) 1000 MCG/ML injection INJECT 1 ML (1,000 MCG TOTAL) INTO THE SKIN EVERY 30 (THIRTY) DAYS. 10/06/22   Shelva Majestic, MD  gabapentin (NEURONTIN) 300 MG capsule Take 300 mg by mouth  daily as needed. 12/22/17   [provider]  omeprazole (PRILOSEC) 20 MG capsule TAKE 1 CAPSULE BY MOUTH EVERY DAY 10/06/22   Shelva Majestic, MD  bisoprolol-hydrochlorothiazide [facility]) 2.5-6.25 MG per tablet Take 1 tablet by mouth daily. 10/28/11 11/28/11  Stacie Glaze, MD    Family History Family History  Problem Relation Age of Onset   Breast cancer Mother    Thyroid cancer Mother    Healthy Father    Colon cancer Neg Hx    Esophageal cancer Neg Hx    Rectal cancer Neg Hx    Stomach cancer Neg Hx    Colon polyps Neg Hx     Social History Social History   Tobacco Use   Smoking status: Never   Smokeless tobacco: Never  Vaping Use   Vaping Use: Never used  Substance Use Topics   Alcohol use: Yes    Alcohol/week: 0.0 - 4.0 standard drinks of alcohol    Comment: 3-4 times per month per pt   Drug use: No     Allergies   Patient has no known allergies.   Review of Systems Review of Systems Per HPI  Physical Exam Triage Vital Signs ED Triage Vitals  Enc Vitals Group     BP 02/19/23 1923 (!) 166/98     Pulse Rate 02/19/23 1923 79     Resp 02/19/23 1923 18     Temp 02/19/23 1923 98.6 F (37 C)     Temp Source 02/19/23 1923 Oral     SpO2 02/19/23 1923 96 %     Weight --      Height --      Head Circumference --      Peak Flow --      Pain Score 02/19/23 1924 8     Pain Loc --      Pain Edu? --      Excl. in GC? --    No data found.  Updated Vital Signs BP (!) 166/98 (BP Location: Right Arm)   Pulse 79   Temp 98.6 F (37 C) (Oral)   Resp 18   SpO2 96%   Visual Acuity Right Eye Distance:   Left Eye Distance:   Bilateral Distance:    Right Eye Near:   Left Eye Near:    Bilateral Near:     Physical Exam Vitals and nursing note reviewed.  Constitutional:      General: He is not in acute distress.    Appearance: Normal appearance.  Eyes:     Extraocular Movements: Extraocular movements intact.     Pupils: Pupils are equal, round, and  reactive to light.  Pulmonary:     Effort: Pulmonary effort is normal.  Musculoskeletal:     Right upper arm: Normal.     Right elbow: Swelling present. Normal range of motion. Tenderness present in olecranon process.     Right forearm: Normal.     Comments: Swelling and tenderness noted to the bursa of the right elbow over the olecranon process.  Area is erythematous, and warm to palpation.  Skin:    General: Skin is warm and dry.  Neurological:     General: No focal deficit present.     Mental Status: He is alert and oriented to person, place, and time.  Psychiatric:        Mood and Affect: Mood normal.        Behavior: Behavior normal.      UC Treatments / Results  Labs (all labs ordered are listed, but only abnormal results are displayed) Labs Reviewed - No data to display  EKG   Radiology No results found.  Procedures Procedures (including critical care time)  Medications Ordered in UC Medications  dexamethasone (DECADRON) injection 10 mg (has no administration in time range)    Initial Impression / Assessment and Plan / UC Course  I have reviewed the triage vital signs and the nursing notes.  Pertinent labs & imaging results that were available during my care of the patient were reviewed by me and considered in my medical decision making (see chart for details).  The patient is well-appearing, he is in no acute distress, he is hypertensive, vital signs are otherwise stable.  Symptoms are consistent with bursitis of the olecranon process.  Imaging was not performed as it was not available in the clinic today.  Patient was advised upon check-in.  However, do not feel imaging would impact the treatment for patient's symptoms.  Patient was administered Decadron 10 mg IM to help with inflammation and swelling.  Patient was prescribed prednisone 50 mg for the next 5 days.  Patient was also provided an Ace wrap for the right elbow to provide compression and to help with  swelling.  Supportive care recommendations were provided and discussed with the patient to include keeping the right upper extremity active, and the application of ice.  Patient was also given exercises that he can perform at home.  Patient was advised that if symptoms do not improve, recommend following up with his primary care physician, or with orthopedics for possible drainage, he can also follow-up in this clinic as needed.  Patient is in agreement with this plan of care and verbalizes understanding.  All questions were answered.  Patient stable for discharge.  Final Clinical Impressions(s) / UC Diagnoses   Final diagnoses:  None   Discharge Instructions   None    ED Prescriptions   None    PDMP not reviewed this encounter.   Abran Cantor, NP 02/19/23 1947    Abran Cantor, NP 02/19/23 1954

## 2023-02-23 ENCOUNTER — Ambulatory Visit: Payer: BC Managed Care – PPO | Admitting: Internal Medicine

## 2023-02-23 ENCOUNTER — Encounter: Payer: Self-pay | Admitting: Internal Medicine

## 2023-02-23 VITALS — BP 176/104 | HR 75 | Temp 98.2°F | Ht 71.0 in | Wt 207.8 lb

## 2023-02-23 DIAGNOSIS — M7021 Olecranon bursitis, right elbow: Secondary | ICD-10-CM | POA: Diagnosis not present

## 2023-02-23 MED ORDER — SKINEEZ COMPRESSION ARM SLEEVE MISC: 1.0000 | Freq: Every day | 0 refills | Status: DC

## 2023-02-23 MED ORDER — PEPCID COMPLETE 10-800-165 MG PO CHEW
1.0000 | CHEWABLE_TABLET | Freq: Two times a day (BID) | ORAL | 11 refills | Status: DC | PRN
Start: 2023-02-23 — End: 2023-03-05

## 2023-02-23 MED ORDER — KETOROLAC TROMETHAMINE 10 MG PO TABS
10.0000 mg | ORAL_TABLET | Freq: Four times a day (QID) | ORAL | 0 refills | Status: DC | PRN
Start: 2023-02-23 — End: 2023-07-24

## 2023-02-23 MED ORDER — DICLOFENAC SODIUM 1 % EX GEL
4.0000 g | Freq: Four times a day (QID) | CUTANEOUS | 3 refills | Status: DC | PRN
Start: 2023-02-23 — End: 2023-03-05

## 2023-02-23 MED ORDER — DEXAMETHASONE 4 MG PO TABS
4.0000 mg | ORAL_TABLET | Freq: Every day | ORAL | 0 refills | Status: DC
Start: 2023-02-23 — End: 2023-03-04

## 2023-02-23 NOTE — Progress Notes (Signed)
[facility]: [phone]   Routine Medical Office Visit  Patient:  Bradley [name]      Age: 57 y.o.       Sex:  male  Date:   02/23/2023 PCP:    Shelva Majestic, MD   Today's Healthcare Provider: Lula Olszewski, MD   Assessment and Plan:   Based on Abridge AI conversational text extraction: Assessment and Plan    Olecranon Bursitis: Pain and swelling over the olecranon process of the right elbow. Initial improvement with prednisone and dexamethasone injection, but symptoms returned. No trauma reported. -Discontinue prednisone and start dexamethasone tablets. -Start Toradol for 5 days, with concurrent use of Pepcid Complete to prevent gastric ulceration. -Recommend use of a compression sleeve for the elbow, ideally with adjustable Velcro strap. -Referral to both orthopedics and sports medicine for possible steroid injection into the bursa. -Follow up with primary care in one week if not seen by orthopedics or sports medicine. -Provide work excuse, advising rest of the right arm.      Based on updated problems and orders placed with problem based charting:  Assessment and Plan Bradley [name] was seen today for follow-up.  Olecranon bursitis of right elbow -     Ambulatory referral to Orthopedic Surgery -     dexAMETHasone; Take 1 tablet (4 mg total) by mouth daily. Replaces prednisone  Dispense: 5 tablet; Refill: 0 -     Ketorolac Tromethamine; Take 1 tablet (10 mg total) by mouth every 6 (six) hours as needed. Replace ibuprofen and aleve  Dispense: 20 tablet; Refill: 0 -     Pepcid Complete; Chew 1 tablet by mouth 2 (two) times daily as needed.  Dispense: 100 tablet; Refill: 11 -     Ambulatory referral to Sports Medicine  Other orders -     Skineez Compression Arm Sleeve; 1 each by Does not apply route daily at 6 (six) AM.  Dispense: 1 each; Refill: 0   Treatment plan discussed and reviewed in detail. Explained medication safety and potential side effects.   Answered all patient questions and confirmed understanding and comfort with the plan. Encouraged patient to see Primary Care Provider (PCP) next week about elevated blood pressure and follow up resolution  Work excuse given.        Clinical Presentation:    57 y.o. male here today for Follow-up (Went to Urgent care on 5/30 for bursitis of right elbow. Was better Friday and then worsened on Saturday evening. Was prescribed prednisone and given an dexamethasone injection with temporary relief.)  Based on Abridge AI conversational text extraction:  Discussed the use of AI scribe software for clinical note transcription with the patient, who gave verbal consent to proceed.  History of Present Illness   The patient, born on 1966/05/25, presented with persistent right elbow pain, specifically over the olecranon process. 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 The patient sought care at an urgent care center the previous week due to severe pain. They received a corticosteroid injection and a prescription for prednisone. The patient reported initial improvement in symptoms, but the pain returned to its original intensity by the following Saturday. The patient has one remaining dose of prednisone.  The patient has been resting the affected arm, keeping it elevated, and applying ice for symptom management. They have also been taking ibuprofen, which has not provided significant relief. The patient has not used any topical anti-inflammatories. The patient's pain is severe enough to  limit their daily activities, including work. Despite the pain, the patient has not experienced any swelling extending down the arm.  The patient has been wearing an ace wrap for compression, as advised by the urgent care center. However, they have not used a more substantial compression garment. The patient has not had any imaging studies of the elbow, and there has been no surgical  intervention. The patient has not seen a specialist for this issue, but is open to seeing either an orthopedic or sports medicine specialist for further management, including possible steroid injection into the bursa.        Reviewed chart data: Active Ambulatory Problems    Diagnosis Date Noted   B12 deficiency 09/05/2009   Hyperlipidemia 05/12/2007   External hemorrhoids 06/07/2008   ALLERGIC RHINITIS, SEASONAL 07/24/2009   GERD 05/12/2007   IBS 08/31/2009   History of colonic polyps 10/03/2010   Insomnia 05/10/2015   Gallstones 05/10/2015   Essential hypertension 05/10/2015   History of small bowel obstruction 04/11/2016   Pulmonary nodules 06/03/2016   Hemangioma of liver 11/24/2017   Herniated nucleus pulposus, L3-4 left 06/20/2019   Aortic atherosclerosis (HCC) 08/25/2022   Resolved Ambulatory Problems    Diagnosis Date Noted   Acute pharyngitis 02/01/2009   URI 02/01/2009   Cough variant asthma 10/12/2007   CONSTIPATION, CHRONIC 06/07/2008   PLANT DERMATITIS 06/07/2008   NECK PAIN, ACUTE 04/30/2009   LOW BACK PAIN 08/24/2007   LATERAL EPICONDYLITIS 03/07/2010   CHEST PAIN UNSPECIFIED 08/31/2009   NAUSEA 08/31/2009   DYSPHAGIA ORAL PHASE 08/31/2009   Diarrhea 08/31/2009   TRANSAMINASES, SERUM, ELEVATED 08/31/2009   FCE NCK&SCLP NO EYE ABRAS/FRIC BURN W/O INF 04/30/2009   Foreign body in ear 04/30/2009   URI (upper respiratory infection) 01/03/2015   Hypokalemia 04/11/2016   Total bilirubin, elevated 04/11/2016   Small bowel obstruction (HCC) 11/18/2017   Lumbar radiculopathy 06/20/2019   Past Medical History:  Diagnosis Date   Allergy    Blood transfusion without reported diagnosis    Elevated LFTs    GERD (gastroesophageal reflux disease)    Hiatal hernia    Hypertension    Liver lesion    Lung mass    Personal history of colonic polyps 09/28/2009   PONV (postoperative nausea and vomiting)    Schatzki's ring    Vitamin B 12 deficiency      Outpatient Medications Prior to Visit  Medication Sig   amLODipine (NORVASC) 2.5 MG tablet TAKE 1 TABLET BY MOUTH EVERY DAY   atorvastatin (LIPITOR) 10 MG tablet TAKE 1 TABLET BY MOUTH EVERY DAY   clonazePAM (KLONOPIN) 0.5 MG tablet Take 1 tablet (0.5 mg total) by mouth at bedtime as needed. for sleep   [employer] SUNSCREEN SPF 30 EX Apply 1 application  topically daily at 6 (six) AM.   cyanocobalamin (VITAMIN B12) 1000 MCG/ML injection INJECT 1 ML (1,000 MCG TOTAL) INTO THE SKIN EVERY 30 (THIRTY) DAYS.   gabapentin (NEURONTIN) 300 MG capsule Take 300 mg by mouth daily as needed.   omeprazole (PRILOSEC) 20 MG capsule TAKE 1 CAPSULE BY MOUTH EVERY DAY   [DISCONTINUED] predniSONE (DELTASONE) 50 MG tablet Take 1 tablet daily with breakfast for the next 5 days.   No facility-administered medications prior to visit.         Clinical Data Analysis:   Physical Exam  BP (!) 176/104 (BP Location: Left Arm, Patient Position: Sitting)   Pulse 75   Temp 98.2 F (36.8 C) (Temporal)   Ht  5\' 11"  (1.803 m)   Wt 207 lb 12.8 oz (94.3 kg)   SpO2 97%   BMI 28.98 kg/m  Wt Readings from Last 10 Encounters:  02/23/23 207 lb 12.8 oz (94.3 kg)  02/10/23 201 lb 9.6 oz (91.4 kg)  01/02/23 200 lb (90.7 kg)  12/01/22 200 lb (90.7 kg)  08/25/22 201 lb 9.6 oz (91.4 kg)  06/17/22 195 lb 9.6 oz (88.7 kg)  02/10/22 200 lb (90.7 kg)  08/22/21 196 lb (88.9 kg)  08/06/21 190 lb (86.2 kg)  04/07/21 196 lb (88.9 kg)   Vital signs reviewed.  Nursing notes reviewed. Weight trend reviewed. Abnormalities and Problem-Specific physical exam findings:  tenderness over R olecranon bursa, which is wrapped in ace bandages  General Appearance:  No acute distress appreciable.   Well-groomed, healthy-appearing male.  Well proportioned with no abnormal fat distribution.  Good muscle tone. Skin: Clear and well-hydrated. Pulmonary:  Normal work of breathing at rest, no respiratory distress apparent. SpO2: 97 %  Musculoskeletal: All  extremities are intact.  Neurological:  Awake, alert, oriented, and engaged.  No obvious focal neurological deficits or cognitive impairments.  Sensorium seems unclouded.   Speech is clear and coherent with logical content. Psychiatric:  Appropriate mood, pleasant and cooperative demeanor, thoughtful and engaged during the exam  Results Reviewed:    No results found for any visits on 02/23/23.  No results found for this or any previous visit (from the past 2160 hour(s)).  No image results found.   No results found.     Signed: Lula Olszewski, MD 02/23/2023 9:41 AM

## 2023-02-23 NOTE — Patient Instructions (Signed)
It was a pleasure seeing you today! Your health and satisfaction are our top priorities.   Glenetta Hew, MD  Next Steps:  [x]  Flexible Follow-Up: We recommend a follow up with your primary care, a specialist, or me within 1-2 weeks for ensuring your problem resolves. This allows for progress monitoring and treatment adjustments. [x]  Early Intervention: Schedule sooner appointment, call our on-call services, or go to emergency room if there is Increase in pain or discomfort New or worsening symptoms Sudden or severe changes in your health [x]  Lab & X-ray Appointments: complete or schedule to complete today, or call to schedule.  X-rays: [facility] (M-F, 8:30am-noon or 1pm-5pm).  Making the Most of Our Focused (20 minute) Follow Up Appointments:  [x]   Clearly state your top concerns at the beginning of the visit to focus our discussion [x]   If you anticipate you will need more time, please inform the front desk during scheduling - we can book multiple appointments in the same week. [x]   If you have transportation problems- use our convenient video appointments or ask about transportation support. [x]   We can get down to business faster if you use MyChart to update information before the visit and submit non-urgent questions before your visit. Thank you for taking the time to provide details through MyChart.  Let our nurse know and she can import this information into your encounter documents.  Arrival and Wait Times: [x]   Arriving on time ensures that everyone receives prompt attention. [x]   Early morning (8a) and afternoon (1p) appointments tend to have shortest wait times. [x]   Unfortunately, we cannot delay appointments for late arrivals or hold slots during phone calls.  Getting Answers and Following Up  [x]   Simple Questions & Concerns: For quick questions or basic follow-up after your visit, reach [place] at [phone] or MyChart messaging. [x]   Complex Concerns: If  your concern is more complex, scheduling an appointment might be best. Discuss this with the staff to find the most suitable option. [x]   Lab & Imaging Results: We'll contact you directly if results are abnormal or you don't use MyChart. Most normal results will be on MyChart within 2-3 business days, with a review message from Dr. Jon Billings. Haven't heard back in 2 weeks? Need results sooner? Contact us at [phone]. [x]   Referrals: Our referral coordinator will manage specialist referrals. The specialist's office should contact you within 2 weeks to schedule an appointment. Call us if you haven't heard from them after 2 weeks.  Staying Connected  [x]   MyChart: Activate your MyChart for the fastest way to access results and message [place]. See the last page of this paperwork for instructions on how to activate.  Bring to Your Next Appointment  [x]   Medications: Please bring all your medication bottles to your next appointment to ensure we have an accurate record of your prescriptions. [x]   Health Diaries: If you're monitoring any health conditions at home, keeping a diary of your readings can be very helpful for discussions at your next appointment.  Billing  [x]   X-ray & Lab Orders: These are billed by separate companies. Contact the invoicing company directly for questions or concerns. [x]   Visit Charges: Discuss any billing inquiries with our administrative services team.  Your Satisfaction Matters  [x]   Share Your Experience: We strive for your satisfaction! If you have any complaints, or preferably compliments, please let Dr. Jon Billings know directly or contact our Practice Administrators, Edwena Felty or [employer], by  asking at the front desk.   Reviewing Your Records  [x]   Review this early draft of your clinical encounter notes below and the final encounter summary tomorrow on MyChart after its been completed.   Olecranon bursitis of right elbow

## 2023-02-24 ENCOUNTER — Encounter: Payer: Self-pay | Admitting: Orthopaedic Surgery

## 2023-02-24 ENCOUNTER — Other Ambulatory Visit (INDEPENDENT_AMBULATORY_CARE_PROVIDER_SITE_OTHER): Payer: BC Managed Care – PPO

## 2023-02-24 ENCOUNTER — Ambulatory Visit: Payer: BC Managed Care – PPO | Admitting: Orthopaedic Surgery

## 2023-02-24 DIAGNOSIS — M25521 Pain in right elbow: Secondary | ICD-10-CM | POA: Diagnosis not present

## 2023-02-24 MED ORDER — SULFAMETHOXAZOLE-TRIMETHOPRIM 800-160 MG PO TABS
1.0000 | ORAL_TABLET | Freq: Two times a day (BID) | ORAL | 0 refills | Status: DC
Start: 2023-02-24 — End: 2023-03-04

## 2023-02-24 NOTE — Progress Notes (Signed)
Office Visit Note   Patient: Bradley [name]           Date of Birth: 05-Oct-1965           MRN: [id number] Visit Date: 02/24/2023              Requested by: Lula Olszewski, MD [address] [place],  [place] [address] PCP: Shelva Majestic, MD   Assessment & Plan: Visit Diagnoses:  1. Pain in right elbow     Plan: Impression is right elbow olecranon bursitis possibly septic.  Possibly seeded while he was doing yard work.  Will place him on 10 days of double strength Bactrim and follow-up in a week for recheck.  Signs and symptoms of worsening infection discussed with the patient at which point he would need to return back to see me sooner than a week.  Follow-Up Instructions: Return in about 1 week (around 03/03/2023).   Orders:  Orders Placed This Encounter  Procedures   XR Elbow Complete Right (3+View)   Meds ordered this encounter  Medications   sulfamethoxazole-trimethoprim (BACTRIM DS) 800-160 MG tablet    Sig: Take 1 tablet by mouth 2 (two) times daily.    Dispense:  20 tablet    Refill:  0      Procedures: No procedures performed   Clinical Data: No additional findings.   Subjective: Chief Complaint  Patient presents with   Right Elbow - Pain    HPI Patient is a healthy 57 year old gentleman here for evaluation of right elbow olecranon bursitis.  States it flared up after moving brush.  PCP placed him on 5 days of oral prednisone which has brought down the swelling but continues to have pain and redness. Review of Systems  Constitutional: Negative.   HENT: Negative.    Eyes: Negative.   Respiratory: Negative.    Cardiovascular: Negative.   Gastrointestinal: Negative.   Endocrine: Negative.   Genitourinary: Negative.   Skin: Negative.   Allergic/Immunologic: Negative.   Neurological: Negative.   Hematological: Negative.   Psychiatric/Behavioral: Negative.    All other systems reviewed and are negative.    Objective: Vital Signs: There  were no vitals taken for this visit.  Physical Exam Vitals and nursing note reviewed.  Constitutional:      Appearance: He is well-developed.  HENT:     Head: Normocephalic and atraumatic.  Eyes:     Pupils: Pupils are equal, round, and reactive to light.  Pulmonary:     Effort: Pulmonary effort is normal.  Abdominal:     Palpations: Abdomen is soft.  Musculoskeletal:        General: Normal range of motion.     Cervical back: Neck supple.  Skin:    General: Skin is warm.  Neurological:     Mental Status: He is alert and oriented to person, place, and time.  Psychiatric:        Behavior: Behavior normal.        Thought Content: Thought content normal.        Judgment: Judgment normal.     Ortho Exam Examination right elbow shows small fluctuant olecranon bursa with surrounding cellulitis.  It is very tender to touch.  There is a small scab on the back of the elbow. Specialty Comments:  No specialty comments available.  Imaging: No results found.   PMFS History: Patient Active Problem List   Diagnosis Date Noted   Aortic atherosclerosis (HCC) 08/25/2022   Herniated nucleus  pulposus, L3-4 left 06/20/2019   Hemangioma of liver 11/24/2017   Pulmonary nodules 06/03/2016   History of small bowel obstruction 04/11/2016   Insomnia 05/10/2015   Gallstones 05/10/2015   Essential hypertension 05/10/2015   History of colonic polyps 10/03/2010   B12 deficiency 09/05/2009   IBS 08/31/2009   ALLERGIC RHINITIS, SEASONAL 07/24/2009   External hemorrhoids 06/07/2008   Hyperlipidemia 05/12/2007   GERD 05/12/2007   Past Medical History:  Diagnosis Date   Allergy    Blood transfusion without reported diagnosis    Cough variant asthma 10/12/2007   no per pt 06-19-16   Elevated LFTs    Esophageal reflux    on meds   GERD (gastroesophageal reflux disease)    Herniated nucleus pulposus, L3-4 left 06/20/2019   Hiatal hernia    Hyperlipidemia    Hypertension    on meds    Irritable bowel syndrome    LATERAL EPICONDYLITIS 03/07/2010   Qualifier: Diagnosis of  By: Caryl Never MD, Bruce  left shoulder arthrits per pt 06-19-16   Liver lesion    Lumbar radiculopathy 06/20/2019   Lung mass    Personal history of colonic polyps 09/28/2009    ADENOMATOUS POLYP   PONV (postoperative nausea and vomiting)    Schatzki's ring    Vitamin B 12 deficiency     Family History  Problem Relation Age of Onset   Breast cancer Mother    Thyroid cancer Mother    Healthy Father    Colon cancer Neg Hx    Esophageal cancer Neg Hx    Rectal cancer Neg Hx    Stomach cancer Neg Hx    Colon polyps Neg Hx     Past Surgical History:  Procedure Laterality Date   BACK SURGERY  07/12/2019   Dr.Stern   COLONOSCOPY  07/03/2016   COLONOSCOPY  2021   KN-MAC-suprep(exc)-TA-3 yr recall   Exploration Laparotomy and Small Bowel resection     LEG SURGERY     left   LUMBAR LAMINECTOMY     x3 last one June 2017   microdisctomy  07/12/2019   L3-L4   POLYPECTOMY     UPPER GASTROINTESTINAL ENDOSCOPY  07/03/2016   VENTRAL HERNIA REPAIR     VIDEO BRONCHOSCOPY Bilateral 06/10/2016   Procedure: VIDEO BRONCHOSCOPY WITH FLUORO;  Surgeon: Chilton Greathouse, MD;  Location: [facility];  Service: Cardiopulmonary;  Laterality: Bilateral;   VIDEO BRONCHOSCOPY WITH ENDOBRONCHIAL ULTRASOUND N/A 09/08/2016   Procedure: VIDEO BRONCHOSCOPY WITH ENDOBRONCHIAL ULTRASOUND;  Surgeon: Chilton Greathouse, MD;  Location: [facility] OR;  Service: Pulmonary;  Laterality: N/A;   Social History   Occupational History   Occupation: ELECTRICIAN    Employer: [employer]  Tobacco Use   Smoking status: Never   Smokeless tobacco: Never  Vaping Use   Vaping Use: Never used  Substance and Sexual Activity   Alcohol use: Yes    Alcohol/week: 0.0 - 4.0 standard drinks of alcohol    Comment: 3-4 times per month per pt   Drug use: No   Sexual activity: Not on file

## 2023-03-03 ENCOUNTER — Other Ambulatory Visit: Payer: Self-pay | Admitting: Physician Assistant

## 2023-03-03 ENCOUNTER — Ambulatory Visit: Payer: BC Managed Care – PPO | Admitting: Orthopaedic Surgery

## 2023-03-03 DIAGNOSIS — M25521 Pain in right elbow: Secondary | ICD-10-CM | POA: Diagnosis not present

## 2023-03-03 MED ORDER — HYDROCODONE-ACETAMINOPHEN 5-325 MG PO TABS: 1.0000 | ORAL_TABLET | Freq: Three times a day (TID) | ORAL | 0 refills | Status: DC | PRN

## 2023-03-03 MED ORDER — ONDANSETRON HCL 4 MG PO TABS: 4.0000 mg | ORAL_TABLET | Freq: Three times a day (TID) | ORAL | 0 refills | Status: DC | PRN

## 2023-03-03 NOTE — Progress Notes (Signed)
Office Visit Note   Patient: Bradley [name]           Date of Birth: 05-31-1966           MRN: [id number] Visit Date: 03/03/2023              Requested by: Shelva Majestic, MD [address] [place],  [place] [address] PCP: Shelva Majestic, MD   Assessment & Plan: Visit Diagnoses:  1. Pain in right elbow     Plan: Impression is right elbow olecranon bursitis that has failed 2 rounds of antibiotics.  At this point I have recommended formal incision and drainage in the operating room this week.  He is to continue to take the antibiotics.  Eunice Blase will meet with the patient today to confirm surgery time.  Follow-Up Instructions: No follow-ups on file.   Orders:  No orders of the defined types were placed in this encounter.  No orders of the defined types were placed in this encounter.     Procedures: No procedures performed   Clinical Data: No additional findings.   Subjective: No chief complaint on file.   HPI [name] returns today for recheck on the right olecranon bursitis.  Still reports that he has not quite a bit of tenderness and redness.  Denies any constitutional symptoms. Review of Systems  Constitutional: Negative.   HENT: Negative.    Eyes: Negative.   Respiratory: Negative.    Cardiovascular: Negative.   Gastrointestinal: Negative.   Endocrine: Negative.   Genitourinary: Negative.   Skin: Negative.   Allergic/Immunologic: Negative.   Neurological: Negative.   Hematological: Negative.   Psychiatric/Behavioral: Negative.    All other systems reviewed and are negative.    Objective: Vital Signs: There were no vitals taken for this visit.  Physical Exam Vitals and nursing note reviewed.  Constitutional:      Appearance: He is well-developed.  Pulmonary:     Effort: Pulmonary effort is normal.  Abdominal:     Palpations: Abdomen is soft.  Skin:    General: Skin is warm.  Neurological:     Mental Status: He is alert and oriented to  person, place, and time.  Psychiatric:        Behavior: Behavior normal.        Thought Content: Thought content normal.        Judgment: Judgment normal.     Ortho Exam Examination of the right globe shows persistent erythema to the posterior aspect of the elbow.  This is very tender to touch.  Range of motion of the elbow cause any significant pain. Specialty Comments:  No specialty comments available.  Imaging: No results found.   PMFS History: Patient Active Problem List   Diagnosis Date Noted   Aortic atherosclerosis (HCC) 08/25/2022   Herniated nucleus pulposus, L3-4 left 06/20/2019   Hemangioma of liver 11/24/2017   Pulmonary nodules 06/03/2016   History of small bowel obstruction 04/11/2016   Insomnia 05/10/2015   Gallstones 05/10/2015   Essential hypertension 05/10/2015   History of colonic polyps 10/03/2010   B12 deficiency 09/05/2009   IBS 08/31/2009   ALLERGIC RHINITIS, SEASONAL 07/24/2009   External hemorrhoids 06/07/2008   Hyperlipidemia 05/12/2007   GERD 05/12/2007   Past Medical History:  Diagnosis Date   Allergy    Blood transfusion without reported diagnosis    Cough variant asthma 10/12/2007   no per pt 06-19-16   Elevated LFTs    Esophageal reflux  on meds   GERD (gastroesophageal reflux disease)    Herniated nucleus pulposus, L3-4 left 06/20/2019   Hiatal hernia    Hyperlipidemia    Hypertension    on meds   Irritable bowel syndrome    LATERAL EPICONDYLITIS 03/07/2010   Qualifier: Diagnosis of  By: Caryl Never MD, Bruce  left shoulder arthrits per pt 06-19-16   Liver lesion    Lumbar radiculopathy 06/20/2019   Lung mass    Personal history of colonic polyps 09/28/2009    ADENOMATOUS POLYP   PONV (postoperative nausea and vomiting)    Schatzki's ring    Vitamin B 12 deficiency     Family History  Problem Relation Age of Onset   Breast cancer Mother    Thyroid cancer Mother    Healthy Father    Colon cancer Neg Hx    Esophageal  cancer Neg Hx    Rectal cancer Neg Hx    Stomach cancer Neg Hx    Colon polyps Neg Hx     Past Surgical History:  Procedure Laterality Date   BACK SURGERY  07/12/2019   Dr.Stern   COLONOSCOPY  07/03/2016   COLONOSCOPY  2021   KN-MAC-suprep(exc)-TA-3 yr recall   Exploration Laparotomy and Small Bowel resection     LEG SURGERY     left   LUMBAR LAMINECTOMY     x3 last one June 2017   microdisctomy  07/12/2019   L3-L4   POLYPECTOMY     UPPER GASTROINTESTINAL ENDOSCOPY  07/03/2016   VENTRAL HERNIA REPAIR     VIDEO BRONCHOSCOPY Bilateral 06/10/2016   Procedure: VIDEO BRONCHOSCOPY WITH FLUORO;  Surgeon: Chilton Greathouse, MD;  Location: [facility];  Service: Cardiopulmonary;  Laterality: Bilateral;   VIDEO BRONCHOSCOPY WITH ENDOBRONCHIAL ULTRASOUND N/A 09/08/2016   Procedure: VIDEO BRONCHOSCOPY WITH ENDOBRONCHIAL ULTRASOUND;  Surgeon: Chilton Greathouse, MD;  Location: [facility];  Service: Pulmonary;  Laterality: N/A;   Social History   Occupational History   Occupation: ELECTRICIAN    Employer: [employer]  Tobacco Use   Smoking status: Never   Smokeless tobacco: Never  Vaping Use   Vaping Use: Never used  Substance and Sexual Activity   Alcohol use: Yes    Alcohol/week: 0.0 - 4.0 standard drinks of alcohol    Comment: 3-4 times per month per pt   Drug use: No   Sexual activity: Not on file

## 2023-03-04 ENCOUNTER — Encounter (HOSPITAL_BASED_OUTPATIENT_CLINIC_OR_DEPARTMENT_OTHER): Payer: Self-pay | Admitting: Orthopaedic Surgery

## 2023-03-05 ENCOUNTER — Other Ambulatory Visit: Payer: Self-pay

## 2023-03-05 ENCOUNTER — Ambulatory Visit (HOSPITAL_BASED_OUTPATIENT_CLINIC_OR_DEPARTMENT_OTHER): Payer: BC Managed Care – PPO | Admitting: Anesthesiology

## 2023-03-05 ENCOUNTER — Encounter (HOSPITAL_BASED_OUTPATIENT_CLINIC_OR_DEPARTMENT_OTHER): Payer: Self-pay | Admitting: Orthopaedic Surgery

## 2023-03-05 ENCOUNTER — Encounter (HOSPITAL_BASED_OUTPATIENT_CLINIC_OR_DEPARTMENT_OTHER)
Admission: RE | Disposition: A | Discharge status: Home / Self Care | Payer: Self-pay | Source: Home / Self Care | Attending: Orthopaedic Surgery

## 2023-03-05 ENCOUNTER — Ambulatory Visit (HOSPITAL_BASED_OUTPATIENT_CLINIC_OR_DEPARTMENT_OTHER)
Admission: RE | Admit: 2023-03-05 | Discharge: 2023-03-05 | Disposition: A | Discharge status: Home / Self Care | Payer: BC Managed Care – PPO | Attending: Orthopaedic Surgery | Admitting: Orthopaedic Surgery

## 2023-03-05 DIAGNOSIS — I10 Essential (primary) hypertension: Secondary | ICD-10-CM | POA: Diagnosis not present

## 2023-03-05 DIAGNOSIS — M7021 Olecranon bursitis, right elbow: Secondary | ICD-10-CM | POA: Diagnosis not present

## 2023-03-05 DIAGNOSIS — M009 Pyogenic arthritis, unspecified: Secondary | ICD-10-CM | POA: Diagnosis not present

## 2023-03-05 DIAGNOSIS — M71121 Other infective bursitis, right elbow: Secondary | ICD-10-CM

## 2023-03-05 HISTORY — PX: INCISION AND DRAINAGE: SHX5863

## 2023-03-05 SURGERY — INCISION AND DRAINAGE
Anesthesia: General | Laterality: Right

## 2023-03-05 MED ORDER — EPHEDRINE 5 MG/ML INJ
INTRAVENOUS | Status: AC
  Filled 2023-03-05: qty 5

## 2023-03-05 MED ORDER — MEPERIDINE HCL 25 MG/ML IJ SOLN: 6.2500 mg | INTRAMUSCULAR | Status: DC | PRN

## 2023-03-05 MED ORDER — FENTANYL CITRATE (PF) 100 MCG/2ML IJ SOLN
25.0000 ug | INTRAMUSCULAR | Status: DC | PRN
  Administered 2023-03-05: 25 ug via INTRAVENOUS
  Administered 2023-03-05: 50 ug via INTRAVENOUS
  Administered 2023-03-05: 25 ug via INTRAVENOUS
  Administered 2023-03-05: 50 ug via INTRAVENOUS

## 2023-03-05 MED ORDER — LACTATED RINGERS IV SOLN: INTRAVENOUS | Status: DC

## 2023-03-05 MED ORDER — ONDANSETRON HCL 4 MG/2ML IJ SOLN: 4.0000 mg | Freq: Once | INTRAMUSCULAR | Status: DC | PRN

## 2023-03-05 MED ORDER — KETOROLAC TROMETHAMINE 30 MG/ML IJ SOLN
30.0000 mg | Freq: Once | INTRAMUSCULAR | Status: AC | PRN
  Administered 2023-03-05: 30 mg via INTRAVENOUS

## 2023-03-05 MED ORDER — MIDAZOLAM HCL 5 MG/5ML IJ SOLN
INTRAMUSCULAR | Status: DC | PRN
  Administered 2023-03-05: 2 mg via INTRAVENOUS

## 2023-03-05 MED ORDER — VANCOMYCIN HCL 1250 MG/250ML IV SOLN
1250.0000 mg | Freq: Once | INTRAVENOUS | Status: AC
  Administered 2023-03-05 (×2): 1250 mg via INTRAVENOUS
  Filled 2023-03-05: qty 250

## 2023-03-05 MED ORDER — PHENYLEPHRINE 80 MCG/ML (10ML) SYRINGE FOR IV PUSH (FOR BLOOD PRESSURE SUPPORT)
PREFILLED_SYRINGE | INTRAVENOUS | Status: AC
  Filled 2023-03-05: qty 10

## 2023-03-05 MED ORDER — OXYCODONE HCL 5 MG/5ML PO SOLN: 5.0000 mg | Freq: Once | ORAL | Status: DC | PRN

## 2023-03-05 MED ORDER — FENTANYL CITRATE (PF) 100 MCG/2ML IJ SOLN
INTRAMUSCULAR | Status: AC
  Filled 2023-03-05: qty 2

## 2023-03-05 MED ORDER — KETOROLAC TROMETHAMINE 30 MG/ML IJ SOLN
INTRAMUSCULAR | Status: AC
  Filled 2023-03-05: qty 1

## 2023-03-05 MED ORDER — DEXAMETHASONE SODIUM PHOSPHATE 10 MG/ML IJ SOLN
INTRAMUSCULAR | Status: AC
  Filled 2023-03-05: qty 1

## 2023-03-05 MED ORDER — PROPOFOL 10 MG/ML IV BOLUS
INTRAVENOUS | Status: DC | PRN
  Administered 2023-03-05: 200 mg via INTRAVENOUS

## 2023-03-05 MED ORDER — FENTANYL CITRATE (PF) 100 MCG/2ML IJ SOLN
INTRAMUSCULAR | Status: DC | PRN
  Administered 2023-03-05: 100 ug via INTRAVENOUS

## 2023-03-05 MED ORDER — PROPOFOL 10 MG/ML IV BOLUS
INTRAVENOUS | Status: AC
  Filled 2023-03-05: qty 20

## 2023-03-05 MED ORDER — OXYCODONE HCL 5 MG PO TABS: 5.0000 mg | ORAL_TABLET | Freq: Once | ORAL | Status: DC | PRN

## 2023-03-05 MED ORDER — ACETAMINOPHEN 160 MG/5ML PO SOLN: 325.0000 mg | ORAL | Status: DC | PRN

## 2023-03-05 MED ORDER — LIDOCAINE 2% (20 MG/ML) 5 ML SYRINGE
INTRAMUSCULAR | Status: AC
  Filled 2023-03-05: qty 5

## 2023-03-05 MED ORDER — DEXAMETHASONE SODIUM PHOSPHATE 4 MG/ML IJ SOLN
INTRAMUSCULAR | Status: DC | PRN
  Administered 2023-03-05: 5 mg via INTRAVENOUS

## 2023-03-05 MED ORDER — DOXYCYCLINE HYCLATE 100 MG PO TABS: 100.0000 mg | ORAL_TABLET | Freq: Two times a day (BID) | ORAL | 0 refills | Status: DC

## 2023-03-05 MED ORDER — VANCOMYCIN HCL 500 MG/100ML IV SOLN
INTRAVENOUS | Status: AC
  Filled 2023-03-05: qty 100

## 2023-03-05 MED ORDER — ACETAMINOPHEN 325 MG PO TABS: 325.0000 mg | ORAL_TABLET | ORAL | Status: DC | PRN

## 2023-03-05 MED ORDER — MIDAZOLAM HCL 2 MG/2ML IJ SOLN
INTRAMUSCULAR | Status: AC
  Filled 2023-03-05: qty 2

## 2023-03-05 MED ORDER — SUCCINYLCHOLINE CHLORIDE 200 MG/10ML IV SOSY
PREFILLED_SYRINGE | INTRAVENOUS | Status: AC
  Filled 2023-03-05: qty 10

## 2023-03-05 MED ORDER — LIDOCAINE HCL (CARDIAC) PF 100 MG/5ML IV SOSY
PREFILLED_SYRINGE | INTRAVENOUS | Status: DC | PRN
  Administered 2023-03-05: 50 mg via INTRAVENOUS

## 2023-03-05 MED ORDER — BUPIVACAINE HCL (PF) 0.25 % IJ SOLN
INTRAMUSCULAR | Status: DC | PRN
  Administered 2023-03-05: 20 mL

## 2023-03-05 MED ORDER — VANCOMYCIN HCL IN DEXTROSE 1-5 GM/200ML-% IV SOLN
INTRAVENOUS | Status: AC
  Filled 2023-03-05: qty 200

## 2023-03-05 MED ORDER — ONDANSETRON HCL 4 MG/2ML IJ SOLN
INTRAMUSCULAR | Status: AC
  Filled 2023-03-05: qty 2

## 2023-03-05 SURGICAL SUPPLY — 47 items
BLADE SURG 15 STRL LF DISP TIS (BLADE) ×1 IMPLANT
BLADE SURG 15 STRL SS (BLADE) ×1
BNDG CMPR 5X4 KNIT ELC UNQ LF (GAUZE/BANDAGES/DRESSINGS) ×1
BNDG CMPR 9X4 STRL LF SNTH (GAUZE/BANDAGES/DRESSINGS)
BNDG ELASTIC 4INX 5YD STR LF (GAUZE/BANDAGES/DRESSINGS) ×1 IMPLANT
BNDG ESMARK 4X9 LF (GAUZE/BANDAGES/DRESSINGS) IMPLANT
CUFF TOURN SGL QUICK 18X3 (MISCELLANEOUS) ×1 IMPLANT
DRAPE EXTREMITY T 121X128X90 (DISPOSABLE) ×1 IMPLANT
DRAPE SURG 17X23 STRL (DRAPES) ×1 IMPLANT
DURAPREP 26ML APPLICATOR (WOUND CARE) ×1 IMPLANT
ELECT REM PT RETURN 9FT ADLT (ELECTROSURGICAL) ×1
ELECTRODE REM PT RTRN 9FT ADLT (ELECTROSURGICAL) ×1 IMPLANT
GAUZE SPONGE 4X4 12PLY STRL (GAUZE/BANDAGES/DRESSINGS) ×1 IMPLANT
GAUZE XEROFORM 1X8 LF (GAUZE/BANDAGES/DRESSINGS) ×1 IMPLANT
GLOVE BIOGEL PI IND STRL 7.5 (GLOVE) ×1 IMPLANT
GLOVE ECLIPSE 7.0 STRL STRAW (GLOVE) ×1 IMPLANT
GLOVE INDICATOR 7.0 STRL GRN (GLOVE) ×1 IMPLANT
GLOVE SURG SYN 7.5 E (GLOVE) ×1 IMPLANT
GLOVE SURG SYN 7.5 PF PI (GLOVE) ×1 IMPLANT
GOWN STRL REUS W/ TWL LRG LVL3 (GOWN DISPOSABLE) ×1 IMPLANT
GOWN STRL REUS W/ TWL XL LVL3 (GOWN DISPOSABLE) ×1 IMPLANT
GOWN STRL REUS W/TWL LRG LVL3 (GOWN DISPOSABLE) ×1
GOWN STRL REUS W/TWL XL LVL3 (GOWN DISPOSABLE) ×1
GOWN STRL SURGICAL XL XLNG (GOWN DISPOSABLE) ×1 IMPLANT
NS IRRIG 1000ML POUR BTL (IV SOLUTION) ×1 IMPLANT
PACK ARTHROSCOPY DSU (CUSTOM PROCEDURE TRAY) ×1 IMPLANT
PACK BASIN DAY SURGERY FS (CUSTOM PROCEDURE TRAY) ×1 IMPLANT
PAD CAST 4YDX4 CTTN HI CHSV (CAST SUPPLIES) ×1 IMPLANT
PADDING CAST COTTON 4X4 STRL (CAST SUPPLIES) ×1
PENCIL SMOKE EVACUATOR (MISCELLANEOUS) ×1 IMPLANT
SHEET MEDIUM DRAPE 40X70 STRL (DRAPES) ×1 IMPLANT
SLEEVE SCD COMPRESS KNEE MED (STOCKING) ×1 IMPLANT
SPIKE FLUID TRANSFER (MISCELLANEOUS) IMPLANT
SPLINT PLASTER CAST XFAST 3X15 (CAST SUPPLIES) IMPLANT
SPONGE T-LAP 18X18 ~~LOC~~+RFID (SPONGE) ×1 IMPLANT
STRIP CLOSURE SKIN 1/4X4 (GAUZE/BANDAGES/DRESSINGS) IMPLANT
SUT ETHILON 2 0 FS 18 (SUTURE) IMPLANT
SUT ETHILON 3 0 PS 1 (SUTURE) IMPLANT
SUT ETHILON 4 0 PS 2 18 (SUTURE) ×1 IMPLANT
SUT VIC AB 2-0 CT1 27 (SUTURE) ×1
SUT VIC AB 2-0 CT1 TAPERPNT 27 (SUTURE) ×1 IMPLANT
SUT VIC AB 4-0 P-3 18XBRD (SUTURE) IMPLANT
SUT VIC AB 4-0 P3 18 (SUTURE)
SYR BULB EAR ULCER 3OZ GRN STR (SYRINGE) IMPLANT
TOWEL GREEN STERILE FF (TOWEL DISPOSABLE) ×1 IMPLANT
UNDERPAD 30X36 HEAVY ABSORB (UNDERPADS AND DIAPERS) ×1 IMPLANT
YANKAUER SUCT BULB TIP NO VENT (SUCTIONS) ×1 IMPLANT

## 2023-03-05 NOTE — Anesthesia Preprocedure Evaluation (Addendum)
Anesthesia Evaluation  Patient identified by MRN, date of birth, ID band Patient awake    Reviewed: Allergy & Precautions, NPO status , Patient's Chart, lab work & pertinent test results  History of Anesthesia Complications (+) PONV and history of anesthetic complications  Airway Mallampati: I       Dental   Pulmonary    Pulmonary exam normal        Cardiovascular hypertension, Pt. on medications Normal cardiovascular exam     Neuro/Psych  Neuromuscular disease    GI/Hepatic ,GERD  Medicated and Controlled,,  Endo/Other    Renal/GU      Musculoskeletal   Abdominal Normal abdominal exam  (+)   Peds  Hematology   Anesthesia Other Findings   Reproductive/Obstetrics                             Anesthesia Physical Anesthesia Plan  ASA: 2  Anesthesia Plan: General   Post-op Pain Management:    Induction: Intravenous  PONV Risk Score and Plan: 4 or greater and Ondansetron, Dexamethasone and Midazolam  Airway Management Planned: LMA  Additional Equipment: None  Intra-op Plan:   Post-operative Plan: Extubation in OR  Informed Consent: I have reviewed the patients History and Physical, chart, labs and discussed the procedure including the risks, benefits and alternatives for the proposed anesthesia with the patient or authorized representative who has indicated his/her understanding and acceptance.     Dental advisory given  Plan Discussed with: CRNA  Anesthesia Plan Comments:        Anesthesia Quick Evaluation

## 2023-03-05 NOTE — Discharge Instructions (Addendum)
Postoperative instructions:  Weightbearing instructions: as tolerated  Dressing instructions: Keep your dressing and/or splint clean and dry at all times.  It will be removed at your first post-operative appointment.  Your stitches and/or staples will be removed at this visit.  Incision instructions:  Do not soak your incision for 3 weeks after surgery.  If the incision gets wet, pat dry and do not scrub the incision.  Pain control:  You have been given a prescription to be taken as directed for post-operative pain control.  In addition, elevate the operative extremity above the heart at all times to prevent swelling and throbbing pain.  Take over-the-counter Colace, 100mg  by mouth twice a day while taking narcotic pain medications to help prevent constipation.  Follow up appointments: 1) 7 days for wound check. 2) Dr. Roda Shutters as scheduled.   -------------------------------------------------------------------------------------------------------------  After Surgery Pain Control:  After your surgery, post-surgical discomfort or pain is likely. This discomfort can last several days to a few weeks. At certain times of the day your discomfort may be more intense.  Did you receive a nerve block?  A nerve block can provide pain relief for one hour to two days after your surgery. As long as the nerve block is working, you will experience little or no sensation in the area the surgeon operated on.  As the nerve block wears off, you will begin to experience pain or discomfort. It is very important that you begin taking your prescribed pain medication before the nerve block fully wears off. Treating your pain at the first sign of the block wearing off will ensure your pain is better controlled and more tolerable when full-sensation returns. Do not wait until the pain is intolerable, as the medicine will be less effective. It is better to treat pain in advance than to try and catch up.  General  Anesthesia:  If you did not receive a nerve block during your surgery, you will need to start taking your pain medication shortly after your surgery and should continue to do so as prescribed by your surgeon.  Pain Medication:  Most commonly we prescribe Vicodin and Percocet for post-operative pain. Both of these medications contain a combination of acetaminophen (Tylenol) and a narcotic to help control pain.   It takes between 30 and 45 minutes before pain medication starts to work. It is important to take your medication before your pain level gets too intense.   Nausea is a common side effect of many pain medications. You will want to eat something before taking your pain medicine to help prevent nausea.   If you are taking a prescription pain medication that contains acetaminophen, we recommend that you do not take additional over the counter acetaminophen (Tylenol).  Other pain relieving options:   Using a cold pack to ice the affected area a few times a day (15 to 20 minutes at a time) can help to relieve pain, reduce swelling and bruising.   Elevation of the affected area can also help to reduce pain and swelling.  Post Anesthesia Home Care Instructions  Activity: Get plenty of rest for the remainder of the day. A responsible individual must stay with you for 24 hours following the procedure.  For the next 24 hours, DO NOT: -Drive a car -Advertising copywriter -Drink alcoholic beverages -Take any medication unless instructed by your physician -Make any legal decisions or sign important papers.  Meals: Start with liquid foods such as gelatin or soup. Progress to regular foods  as tolerated. Avoid greasy, spicy, heavy foods. If nausea and/or vomiting occur, drink only clear liquids until the nausea and/or vomiting subsides. Call your physician if vomiting continues.  Special Instructions/Symptoms: Your throat may feel dry or sore from the anesthesia or the breathing tube placed in  your throat during surgery. If this causes discomfort, gargle with warm salt water. The discomfort should disappear within 24 hours.  If you had a scopolamine patch placed behind your ear for the management of post- operative nausea and/or vomiting:  1. The medication in the patch is effective for 72 hours, after which it should be removed.  Wrap patch in a tissue and discard in the trash. Wash hands thoroughly with soap and water. 2. You may remove the patch earlier than 72 hours if you experience unpleasant side effects which may include dry mouth, dizziness or visual disturbances. 3. Avoid touching the patch. Wash your hands with soap and water after contact with the patch.   No ibuprofen until 8:45 p.m.

## 2023-03-05 NOTE — Op Note (Signed)
   Date of Surgery: 03/05/2023  INDICATIONS: Bradley [name] is a 57 y.o.-year-old male with a right septic olecranon bursitis.  The patient did consent to the procedure after discussion of the risks and benefits.  PREOPERATIVE DIAGNOSIS: Septic right olecranon bursitis  POSTOPERATIVE DIAGNOSIS: Same.  PROCEDURE: Incision and drainage of right elbow olecranon bursa  Debridement type: Excisional Debridement  Side: right  Body Location: elbow   Tools used for debridement: scalpel and rongeur  Debridement depth beyond dead/damaged tissue down to healthy viable tissue: yes  Tissue layer involved: skin, subcutaneous tissue and skin, subcutaneous tissue, muscle / fascia  Nature of tissue removed: Devitalized Tissue and Purulence  Irrigation volume: 3 L     Irrigation fluid type: Normal Saline   SURGEON: N. Glee Arvin, M.D.  ASSIST: Starlyn Skeans [place], [place]; necessary for the timely completion of procedure and due to complexity of procedure.  ANESTHESIA:  general  IV FLUIDS AND URINE: See anesthesia.  ESTIMATED BLOOD LOSS: 25 mL.  IMPLANTS: None  DRAINS: Iodoform packing  COMPLICATIONS: see description of procedure.  DESCRIPTION OF PROCEDURE: The patient was brought to the operating room.  The patient had been signed prior to the procedure and this was documented. The patient had the anesthesia placed by the anesthesiologist.  A time-out was performed to confirm that this was the correct patient, site, side and location.  A tourniquet was placed.  The patient had the operative extremity prepped and draped in the standard surgical fashion.    An incision was made directly over the posterior aspect of the elbow overlying the olecranon bursa.  Cultures were obtained and vancomycin was administered.  No frank purulence was encountered.  The bursa appear to be a partially treated septic bursitis.  The bursa was removed entirely with a rondure.  Debridement was taken all the way down to  the olecranon.  Subcutaneous tissue was sharply excised with a rondure.  After thorough debridement 3 L of normal saline was irrigated through the wound.  Hemostasis was obtained.  The incision was partially closed with interrupted 2-0 nylon and the space was packed with iodoform.  Sterile dressings were applied.  Patient tolerated procedure well had no many complications.  Tessa Lerner was necessary for opening, closing, retracting, limb positioning and overall facilitation and timely completion of the procedure.  POSTOPERATIVE PLAN: Discharge home.  Follow up 1 week for packing removal.  Mayra Reel, MD 2:17 PM

## 2023-03-05 NOTE — Anesthesia Postprocedure Evaluation (Signed)
Anesthesia Post Note  Patient: Bradley [name]  Procedure(s) Performed: RIGHT ELBOW INCISION AND DRAINAGE (Right)     Patient location during evaluation: PACU Anesthesia Type: General Level of consciousness: awake and sedated Pain management: pain level controlled Vital Signs Assessment: post-procedure vital signs reviewed and stable Respiratory status: spontaneous breathing Cardiovascular status: stable Postop Assessment: no apparent nausea or vomiting Anesthetic complications: no  No notable events documented.  Last Vitals:  Vitals:   03/05/23 1445 03/05/23 1500  BP: (!) 157/97 (!) 164/96  Pulse: 77 77  Resp: 14 17  Temp:    SpO2: 99% 94%    Last Pain:  Vitals:   03/05/23 1504  TempSrc:   PainSc: 4                  [name]

## 2023-03-05 NOTE — Transfer of Care (Signed)
Immediate Anesthesia Transfer of Care Note  Patient: Bradley [name]  Procedure(s) Performed: RIGHT ELBOW INCISION AND DRAINAGE (Right)  Patient Location: PACU  Anesthesia Type:General  Level of Consciousness: awake, alert , and oriented  Airway & Oxygen Therapy: Patient Spontanous Breathing  Post-op Assessment: Report given to RN and Post -op Vital signs reviewed and stable  Post vital signs: Reviewed and stable  Last Vitals:  Vitals Value Taken Time  BP 157/97 03/05/23 1445  Temp    Pulse 72 03/05/23 1446  Resp 10 03/05/23 1446  SpO2 99 % 03/05/23 1446  Vitals shown include unvalidated device data.  Last Pain:  Vitals:   03/05/23 1433  TempSrc:   PainSc: 7          Complications: No notable events documented.

## 2023-03-05 NOTE — Anesthesia Procedure Notes (Signed)
Procedure Name: LMA Insertion Date/Time: 03/05/2023 1:42 PM  Performed by: Ronnette Hila, CRNAPre-anesthesia Checklist: Patient identified, Emergency Drugs available, Suction available and Patient being monitored Patient Re-evaluated:Patient Re-evaluated prior to induction Oxygen Delivery Method: Circle system utilized Preoxygenation: Pre-oxygenation with 100% oxygen Induction Type: IV induction Ventilation: Mask ventilation without difficulty LMA: LMA inserted LMA Size: 5.0 Number of attempts: 1 Airway Equipment and Method: Bite block Placement Confirmation: positive ETCO2 Tube secured with: Tape Dental Injury: Teeth and Oropharynx as per pre-operative assessment

## 2023-03-05 NOTE — H&P (Signed)
PREOPERATIVE H&P  Chief Complaint: right elbow bursitits  HPI: Bradley [name] is a 57 y.o. male who presents for surgical treatment of right elbow bursitits.  He denies any changes in medical history.  Past Medical History:  Diagnosis Date   Allergy    Blood transfusion without reported diagnosis    Cough variant asthma 10/12/2007   no per pt 06-19-16   Elevated LFTs    Esophageal reflux    on meds   GERD (gastroesophageal reflux disease)    Herniated nucleus pulposus, L3-4 left 06/20/2019   Hiatal hernia    Hyperlipidemia    Hypertension    on meds   Irritable bowel syndrome    LATERAL EPICONDYLITIS 03/07/2010   Qualifier: Diagnosis of  By: Caryl Never MD, Bruce  left shoulder arthrits per pt 06-19-16   Liver lesion    Lumbar radiculopathy 06/20/2019   Lung mass    Personal history of colonic polyps 09/28/2009    ADENOMATOUS POLYP   PONV (postoperative nausea and vomiting)    Schatzki's ring    Vitamin B 12 deficiency    Past Surgical History:  Procedure Laterality Date   BACK SURGERY  07/12/2019   Dr.Stern   COLONOSCOPY  07/03/2016   COLONOSCOPY  2021   KN-MAC-suprep(exc)-TA-3 yr recall   Exploration Laparotomy and Small Bowel resection     LEG SURGERY     left   LUMBAR LAMINECTOMY     x3 last one June 2017   microdisctomy  07/12/2019   L3-L4   POLYPECTOMY     UPPER GASTROINTESTINAL ENDOSCOPY  07/03/2016   VENTRAL HERNIA REPAIR     VIDEO BRONCHOSCOPY Bilateral 06/10/2016   Procedure: VIDEO BRONCHOSCOPY WITH FLUORO;  Surgeon: Chilton Greathouse, MD;  Location: [facility];  Service: Cardiopulmonary;  Laterality: Bilateral;   VIDEO BRONCHOSCOPY WITH ENDOBRONCHIAL ULTRASOUND N/A 09/08/2016   Procedure: VIDEO BRONCHOSCOPY WITH ENDOBRONCHIAL ULTRASOUND;  Surgeon: Chilton Greathouse, MD;  Location: [facility];  Service: Pulmonary;  Laterality: N/A;   Social History   Socioeconomic History   Marital status: Married    Spouse name: Not on file   Number of children: 2    Years of education: Not on file   Highest education level: Associate degree: occupational, Scientist, product/process development, or vocational program  Occupational History   Occupation: ELECTRICIAN    Employer: [employer]  Tobacco Use   Smoking status: Never   Smokeless tobacco: Never  Vaping Use   Vaping Use: Never used  Substance and Sexual Activity   Alcohol use: Yes    Alcohol/week: 0.0 - 4.0 standard drinks of alcohol    Comment: 3-4 times per month per pt   Drug use: No   Sexual activity: Not on file  Other Topics Concern   Not on file  Social History Narrative   Married, lives with wife and son      OCCUPATION: Games developer, 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   Social Determinants of Health   Financial Resource Strain: Low Risk  (02/09/2023)   Overall Financial Resource Strain (CARDIA)    Difficulty of Paying Living Expenses: Not hard at all  Food Insecurity: No Food Insecurity (02/09/2023)   Hunger Vital Sign    Worried About Running Out of Food in the Last Year: Never true    Ran Out of Food in the Last Year: Never true  Transportation Needs: No Transportation Needs (02/09/2023)   PRAPARE - Transportation    Lack of Transportation (Medical): No    Lack  of Transportation (Non-Medical): No  Physical Activity: Insufficiently Active (02/09/2023)   Exercise Vital Sign    Days of Exercise per Week: 2 days    Minutes of Exercise per Session: 60 min  Stress: Stress Concern Present (02/09/2023)   [employer] of Occupational Health - Occupational Stress Questionnaire    Feeling of Stress : To some extent  Social Connections: Moderately Isolated (02/09/2023)   Social Connection and Isolation Panel [NHANES]    Frequency of Communication with Friends and Family: Twice a week    Frequency of Social Gatherings with Friends and Family: Once a week    Attends Religious Services: Never    Database administrator or Organizations: No    Attends Engineer, structural: Not on file     Marital Status: Married   Family History  Problem Relation Age of Onset   Breast cancer Mother    Thyroid cancer Mother    Healthy Father    Colon cancer Neg Hx    Esophageal cancer Neg Hx    Rectal cancer Neg Hx    Stomach cancer Neg Hx    Colon polyps Neg Hx    No Known Allergies Prior to Admission medications   Medication Sig Start Date End Date Taking? Authorizing Provider  amLODipine (NORVASC) 2.5 MG tablet TAKE 1 TABLET BY MOUTH EVERY DAY 10/06/22  Yes Shelva Majestic, MD  atorvastatin (LIPITOR) 10 MG tablet TAKE 1 TABLET BY MOUTH EVERY DAY 03/31/22  Yes Shelva Majestic, MD  clonazePAM (KLONOPIN) 0.5 MG tablet Take 1 tablet (0.5 mg total) by mouth at bedtime as needed. for sleep 08/25/22  Yes Shelva Majestic, MD  cyanocobalamin (VITAMIN B12) 1000 MCG/ML injection INJECT 1 ML (1,000 MCG TOTAL) INTO THE SKIN EVERY 30 (THIRTY) DAYS. 10/06/22  Yes Shelva Majestic, MD  famotidine-calcium carbonate-magnesium hydroxide (PEPCID COMPLETE) 10-800-165 MG chewable tablet Chew 1 tablet by mouth 2 (two) times daily as needed. 02/23/23  Yes Lula Olszewski, MD  gabapentin (NEURONTIN) 300 MG capsule Take 300 mg by mouth daily as needed. 12/22/17  Yes [provider]  HYDROcodone-acetaminophen (NORCO) 5-325 MG tablet Take 1 tablet by mouth 3 (three) times daily as needed. 03/03/23   Cristie Hem, PA-C  omeprazole (PRILOSEC) 20 MG capsule TAKE 1 CAPSULE BY MOUTH EVERY DAY 10/06/22  Yes Shelva Majestic, MD  ondansetron (ZOFRAN) 4 MG tablet Take 1 tablet (4 mg total) by mouth every 8 (eight) hours as needed for nausea or vomiting. 03/03/23   Cristie Hem, PA-C  diclofenac Sodium (VOLTAREN) 1 % GEL Apply 4 g topically 4 (four) times daily as needed. 02/23/23   Lula Olszewski, MD  Elastic Bandages & Supports [facility] COMPRESSION ARM SLEEVE) MISC 1 each by Does not apply route daily at 6 (six) AM. 02/23/23   Lula Olszewski, MD  ketorolac (TORADOL) 10 MG tablet Take 1 tablet (10 mg total)  by mouth every 6 (six) hours as needed. Replace ibuprofen and aleve 02/23/23   Lula Olszewski, MD  bisoprolol-hydrochlorothiazide [facility]) 2.5-6.25 MG per tablet Take 1 tablet by mouth daily. 10/28/11 11/28/11  Stacie Glaze, MD     Positive ROS: All other systems have been reviewed and were otherwise negative with the exception of those mentioned in the HPI and as above.  Physical Exam: General: Alert, no acute distress Cardiovascular: No pedal edema Respiratory: No cyanosis, no use of accessory musculature GI: abdomen soft Skin: No lesions in the area of  chief complaint Neurologic: Sensation intact distally Psychiatric: Patient is competent for consent with normal mood and affect Lymphatic: no lymphedema  MUSCULOSKELETAL: exam stable  Assessment: right elbow bursitits  Plan: Plan for Procedure(s): RIGHT ELBOW INCISION AND DRAINAGE  The risks benefits and alternatives were discussed with the patient including but not limited to the risks of nonoperative treatment, versus surgical intervention including infection, bleeding, nerve injury,  blood clots, cardiopulmonary complications, morbidity, mortality, among others, and they were willing to proceed.   Glee Arvin, MD 03/05/2023 12:16 PM

## 2023-03-06 ENCOUNTER — Encounter (HOSPITAL_BASED_OUTPATIENT_CLINIC_OR_DEPARTMENT_OTHER): Payer: Self-pay | Admitting: Orthopaedic Surgery

## 2023-03-06 LAB — AEROBIC/ANAEROBIC CULTURE W GRAM STAIN (SURGICAL/DEEP WOUND): Gram Stain: NONE SEEN

## 2023-03-07 LAB — AEROBIC/ANAEROBIC CULTURE W GRAM STAIN (SURGICAL/DEEP WOUND)

## 2023-03-08 ENCOUNTER — Other Ambulatory Visit: Payer: Self-pay | Admitting: Orthopaedic Surgery

## 2023-03-08 MED ORDER — CIPROFLOXACIN HCL 500 MG PO TABS: 500.0000 mg | ORAL_TABLET | Freq: Two times a day (BID) | ORAL | 0 refills | Status: AC

## 2023-03-10 LAB — AEROBIC/ANAEROBIC CULTURE W GRAM STAIN (SURGICAL/DEEP WOUND)

## 2023-03-11 NOTE — Progress Notes (Unsigned)
Post-Op Visit Note   Patient: Bradley [name]           Date of Birth: 1966/01/25           MRN: [id number] Visit Date: 03/12/2023 PCP: Shelva Majestic, MD   Assessment & Plan:  Chief Complaint: No chief complaint on file.  Visit Diagnoses:  1. Septic olecranon bursitis, right     Plan: ***  Follow-Up Instructions: No follow-ups on file.   Orders:  No orders of the defined types were placed in this encounter.  No orders of the defined types were placed in this encounter.   Imaging: No results found.  PMFS History: Patient Active Problem List   Diagnosis Date Noted   Septic olecranon bursitis, right 03/05/2023   Aortic atherosclerosis (HCC) 08/25/2022   Herniated nucleus pulposus, L3-4 left 06/20/2019   Hemangioma of liver 11/24/2017   Pulmonary nodules 06/03/2016   History of small bowel obstruction 04/11/2016   Insomnia 05/10/2015   Gallstones 05/10/2015   Essential hypertension 05/10/2015   History of colonic polyps 10/03/2010   B12 deficiency 09/05/2009   IBS 08/31/2009   ALLERGIC RHINITIS, SEASONAL 07/24/2009   External hemorrhoids 06/07/2008   Hyperlipidemia 05/12/2007   GERD 05/12/2007   Past Medical History:  Diagnosis Date   Allergy    Blood transfusion without reported diagnosis    Cough variant asthma 10/12/2007   no per pt 06-19-16   Elevated LFTs    Esophageal reflux    on meds   GERD (gastroesophageal reflux disease)    Herniated nucleus pulposus, L3-4 left 06/20/2019   Hiatal hernia    Hyperlipidemia    Hypertension    on meds   Irritable bowel syndrome    LATERAL EPICONDYLITIS 03/07/2010   Qualifier: Diagnosis of  By: Caryl Never MD, Bruce  left shoulder arthrits per pt 06-19-16   Liver lesion    Lumbar radiculopathy 06/20/2019   Lung mass    Personal history of colonic polyps 09/28/2009    ADENOMATOUS POLYP   PONV (postoperative nausea and vomiting)    Schatzki's ring    Vitamin B 12 deficiency     Family History  Problem  Relation Age of Onset   Breast cancer Mother    Thyroid cancer Mother    Healthy Father    Colon cancer Neg Hx    Esophageal cancer Neg Hx    Rectal cancer Neg Hx    Stomach cancer Neg Hx    Colon polyps Neg Hx     Past Surgical History:  Procedure Laterality Date   BACK SURGERY  07/12/2019   Dr.Stern   COLONOSCOPY  07/03/2016   COLONOSCOPY  2021   KN-MAC-suprep(exc)-TA-3 yr recall   Exploration Laparotomy and Small Bowel resection     INCISION AND DRAINAGE Right 03/05/2023   Procedure: RIGHT ELBOW INCISION AND DRAINAGE;  Surgeon: Tarry Kos, MD;  Location: [facility];  Service: Orthopedics;  Laterality: Right;   LEG SURGERY     left   LUMBAR LAMINECTOMY     x3 last one June 2017   microdisctomy  07/12/2019   L3-L4   POLYPECTOMY     UPPER GASTROINTESTINAL ENDOSCOPY  07/03/2016   VENTRAL HERNIA REPAIR     VIDEO BRONCHOSCOPY Bilateral 06/10/2016   Procedure: VIDEO BRONCHOSCOPY WITH FLUORO;  Surgeon: Chilton Greathouse, MD;  Location: [facility];  Service: Cardiopulmonary;  Laterality: Bilateral;   VIDEO BRONCHOSCOPY WITH ENDOBRONCHIAL ULTRASOUND N/A 09/08/2016   Procedure: VIDEO BRONCHOSCOPY WITH ENDOBRONCHIAL  ULTRASOUND;  Surgeon: Chilton Greathouse, MD;  Location: [facility];  Service: Pulmonary;  Laterality: N/A;   Social History   Occupational History   Occupation: ELECTRICIAN    Employer: [employer]  Tobacco Use   Smoking status: Never   Smokeless tobacco: Never  Vaping Use   Vaping Use: Never used  Substance and Sexual Activity   Alcohol use: Yes    Alcohol/week: 0.0 - 4.0 standard drinks of alcohol    Comment: 3-4 times per month per pt   Drug use: No   Sexual activity: Not on file

## 2023-03-12 ENCOUNTER — Ambulatory Visit (INDEPENDENT_AMBULATORY_CARE_PROVIDER_SITE_OTHER): Payer: BC Managed Care – PPO | Admitting: Orthopaedic Surgery

## 2023-03-12 DIAGNOSIS — M71121 Other infective bursitis, right elbow: Secondary | ICD-10-CM

## 2023-03-19 ENCOUNTER — Encounter: Payer: Self-pay | Admitting: Orthopaedic Surgery

## 2023-03-19 ENCOUNTER — Ambulatory Visit (INDEPENDENT_AMBULATORY_CARE_PROVIDER_SITE_OTHER): Payer: BC Managed Care – PPO | Admitting: Orthopaedic Surgery

## 2023-03-19 ENCOUNTER — Ambulatory Visit: Payer: BC Managed Care – PPO

## 2023-03-19 DIAGNOSIS — M71121 Other infective bursitis, right elbow: Secondary | ICD-10-CM

## 2023-03-19 NOTE — Progress Notes (Signed)
Post-Op Visit Note   Patient: Bradley [name]           Date of Birth: 06-24-1966           MRN: [id number] Visit Date: 03/19/2023 PCP: Shelva Majestic, MD   Assessment & Plan:  Chief Complaint:  Chief Complaint  Patient presents with   Right Elbow - Follow-up    I&D 03/05/2023   Visit Diagnoses:  1. Septic olecranon bursitis, right     Plan: [name] returns today for postop check.  He finished Cipro yesterday.  He is doing well.  Has no complaints.  Examination of the right elbow shows healed surgical incision.  Minimal tenderness to the elbow.  He has expected postsurgical changes.  No signs of continued infection.  Elbow range of motion is painless.  Sutures removed today.  Activity as tolerated.  Follow-up as needed.  Follow-Up Instructions: No follow-ups on file.   Orders:  No orders of the defined types were placed in this encounter.  No orders of the defined types were placed in this encounter.   Imaging: No results found.  PMFS History: Patient Active Problem List   Diagnosis Date Noted   Septic olecranon bursitis, right 03/05/2023   Aortic atherosclerosis (HCC) 08/25/2022   Herniated nucleus pulposus, L3-4 left 06/20/2019   Hemangioma of liver 11/24/2017   Pulmonary nodules 06/03/2016   History of small bowel obstruction 04/11/2016   Insomnia 05/10/2015   Gallstones 05/10/2015   Essential hypertension 05/10/2015   History of colonic polyps 10/03/2010   B12 deficiency 09/05/2009   IBS 08/31/2009   ALLERGIC RHINITIS, SEASONAL 07/24/2009   External hemorrhoids 06/07/2008   Hyperlipidemia 05/12/2007   GERD 05/12/2007   Past Medical History:  Diagnosis Date   Allergy    Blood transfusion without reported diagnosis    Cough variant asthma 10/12/2007   no per pt 06-19-16   Elevated LFTs    Esophageal reflux    on meds   GERD (gastroesophageal reflux disease)    Herniated nucleus pulposus, L3-4 left 06/20/2019   Hiatal hernia    Hyperlipidemia     Hypertension    on meds   Irritable bowel syndrome    LATERAL EPICONDYLITIS 03/07/2010   Qualifier: Diagnosis of  By: Caryl Never MD, Bruce  left shoulder arthrits per pt 06-19-16   Liver lesion    Lumbar radiculopathy 06/20/2019   Lung mass    Personal history of colonic polyps 09/28/2009    ADENOMATOUS POLYP   PONV (postoperative nausea and vomiting)    Schatzki's ring    Vitamin B 12 deficiency     Family History  Problem Relation Age of Onset   Breast cancer Mother    Thyroid cancer Mother    Healthy Father    Colon cancer Neg Hx    Esophageal cancer Neg Hx    Rectal cancer Neg Hx    Stomach cancer Neg Hx    Colon polyps Neg Hx     Past Surgical History:  Procedure Laterality Date   BACK SURGERY  07/12/2019   Dr.Stern   COLONOSCOPY  07/03/2016   COLONOSCOPY  2021   KN-MAC-suprep(exc)-TA-3 yr recall   Exploration Laparotomy and Small Bowel resection     INCISION AND DRAINAGE Right 03/05/2023   Procedure: RIGHT ELBOW INCISION AND DRAINAGE;  Surgeon: Tarry Kos, MD;  Location: [facility];  Service: Orthopedics;  Laterality: Right;   LEG SURGERY     left   LUMBAR LAMINECTOMY  x3 last one June 2017   microdisctomy  07/12/2019   L3-L4   POLYPECTOMY     UPPER GASTROINTESTINAL ENDOSCOPY  07/03/2016   VENTRAL HERNIA REPAIR     VIDEO BRONCHOSCOPY Bilateral 06/10/2016   Procedure: VIDEO BRONCHOSCOPY WITH FLUORO;  Surgeon: Chilton Greathouse, MD;  Location: [facility];  Service: Cardiopulmonary;  Laterality: Bilateral;   VIDEO BRONCHOSCOPY WITH ENDOBRONCHIAL ULTRASOUND N/A 09/08/2016   Procedure: VIDEO BRONCHOSCOPY WITH ENDOBRONCHIAL ULTRASOUND;  Surgeon: Chilton Greathouse, MD;  Location: [facility];  Service: Pulmonary;  Laterality: N/A;   Social History   Occupational History   Occupation: ELECTRICIAN    Employer: [employer]  Tobacco Use   Smoking status: Never   Smokeless tobacco: Never  Vaping Use   Vaping Use: Never used  Substance and Sexual Activity    Alcohol use: Yes    Alcohol/week: 0.0 - 4.0 standard drinks of alcohol    Comment: 3-4 times per month per pt   Drug use: No   Sexual activity: Not on file

## 2023-04-06 ENCOUNTER — Other Ambulatory Visit: Payer: Self-pay | Admitting: Family Medicine

## 2023-04-07 ENCOUNTER — Encounter: Payer: Self-pay | Admitting: Orthopaedic Surgery

## 2023-04-09 NOTE — Progress Notes (Signed)
Post-Op Visit Note   Patient: Bradley [name]           Date of Birth: 05-21-1966           MRN: [id number] Visit Date: 04/10/2023 PCP: Shelva Majestic, MD   Assessment & Plan:  Chief Complaint:  Chief Complaint  Patient presents with   Right Elbow - Pain   Visit Diagnoses:  1. Septic olecranon bursitis, right     Plan: Bradley [name] comes in today for recheck of his right elbow.  He states that for the last 3 to 4 days it has been very sensitive.  Denies any new injuries or trauma.  Denies any constitutional symptoms.  Examination of the right elbow shows fully healed surgical scar.  He has a expected amount of light erythema around the surgical site.  There is no fluctuance or any indication of infection.  He has painless range of motion of the elbow.  His x-rays demonstrate status post excision of the olecranon osteophyte.  There are no acute abnormalities.  From my standpoint his elbow looks okay and consistent with postsurgical appearance.  Looks a little inflamed so I have recommended taking scheduled ibuprofen for a week.  Will see him back as needed.  Follow-Up Instructions: No follow-ups on file.   Orders:  Orders Placed This Encounter  Procedures   XR Elbow 2 Views Right   No orders of the defined types were placed in this encounter.   Imaging: No results found.  PMFS History: Patient Active Problem List   Diagnosis Date Noted   Septic olecranon bursitis, right 03/05/2023   Aortic atherosclerosis (HCC) 08/25/2022   Herniated nucleus pulposus, L3-4 left 06/20/2019   Hemangioma of liver 11/24/2017   Pulmonary nodules 06/03/2016   History of small bowel obstruction 04/11/2016   Insomnia 05/10/2015   Gallstones 05/10/2015   Essential hypertension 05/10/2015   History of colonic polyps 10/03/2010   B12 deficiency 09/05/2009   IBS 08/31/2009   ALLERGIC RHINITIS, SEASONAL 07/24/2009   External hemorrhoids 06/07/2008   Hyperlipidemia 05/12/2007   GERD 05/12/2007    Past Medical History:  Diagnosis Date   Allergy    Blood transfusion without reported diagnosis    Cough variant asthma 10/12/2007   no per pt 06-19-16   Elevated LFTs    Esophageal reflux    on meds   GERD (gastroesophageal reflux disease)    Herniated nucleus pulposus, L3-4 left 06/20/2019   Hiatal hernia    Hyperlipidemia    Hypertension    on meds   Irritable bowel syndrome    LATERAL EPICONDYLITIS 03/07/2010   Qualifier: Diagnosis of  By: Caryl Never MD, Bruce  left shoulder arthrits per pt 06-19-16   Liver lesion    Lumbar radiculopathy 06/20/2019   Lung mass    Personal history of colonic polyps 09/28/2009    ADENOMATOUS POLYP   PONV (postoperative nausea and vomiting)    Schatzki's ring    Vitamin B 12 deficiency     Family History  Problem Relation Age of Onset   Breast cancer Mother    Thyroid cancer Mother    Healthy Father    Colon cancer Neg Hx    Esophageal cancer Neg Hx    Rectal cancer Neg Hx    Stomach cancer Neg Hx    Colon polyps Neg Hx     Past Surgical History:  Procedure Laterality Date   BACK SURGERY  07/12/2019   Dr.Stern   COLONOSCOPY  07/03/2016  COLONOSCOPY  2021   KN-MAC-suprep(exc)-TA-3 yr recall   Exploration Laparotomy and Small Bowel resection     INCISION AND DRAINAGE Right 03/05/2023   Procedure: RIGHT ELBOW INCISION AND DRAINAGE;  Surgeon: Tarry Kos, MD;  Location: [facility];  Service: Orthopedics;  Laterality: Right;   LEG SURGERY     left   LUMBAR LAMINECTOMY     x3 last one June 2017   microdisctomy  07/12/2019   L3-L4   POLYPECTOMY     UPPER GASTROINTESTINAL ENDOSCOPY  07/03/2016   VENTRAL HERNIA REPAIR     VIDEO BRONCHOSCOPY Bilateral 06/10/2016   Procedure: VIDEO BRONCHOSCOPY WITH FLUORO;  Surgeon: Chilton Greathouse, MD;  Location: [facility];  Service: Cardiopulmonary;  Laterality: Bilateral;   VIDEO BRONCHOSCOPY WITH ENDOBRONCHIAL ULTRASOUND N/A 09/08/2016   Procedure: VIDEO BRONCHOSCOPY WITH  ENDOBRONCHIAL ULTRASOUND;  Surgeon: Chilton Greathouse, MD;  Location: [facility];  Service: Pulmonary;  Laterality: N/A;   Social History   Occupational History   Occupation: ELECTRICIAN    Employer: [employer]  Tobacco Use   Smoking status: Never   Smokeless tobacco: Never  Vaping Use   Vaping status: Never Used  Substance and Sexual Activity   Alcohol use: Yes    Alcohol/week: 0.0 - 4.0 standard drinks of alcohol    Comment: 3-4 times per month per pt   Drug use: No   Sexual activity: Not on file

## 2023-04-10 ENCOUNTER — Other Ambulatory Visit (INDEPENDENT_AMBULATORY_CARE_PROVIDER_SITE_OTHER): Payer: BC Managed Care – PPO

## 2023-04-10 ENCOUNTER — Ambulatory Visit (INDEPENDENT_AMBULATORY_CARE_PROVIDER_SITE_OTHER): Payer: BC Managed Care – PPO | Admitting: Orthopaedic Surgery

## 2023-04-10 DIAGNOSIS — M71121 Other infective bursitis, right elbow: Secondary | ICD-10-CM | POA: Diagnosis not present

## 2023-07-05 ENCOUNTER — Other Ambulatory Visit: Payer: Self-pay | Admitting: Family Medicine

## 2023-07-24 ENCOUNTER — Ambulatory Visit: Payer: BC Managed Care – PPO | Admitting: Family

## 2023-07-24 ENCOUNTER — Encounter: Payer: Self-pay | Admitting: Family

## 2023-07-24 VITALS — BP 156/89 | HR 75 | Temp 98.0°F | Ht 71.0 in | Wt 204.4 lb

## 2023-07-24 DIAGNOSIS — M5126 Other intervertebral disc displacement, lumbar region: Secondary | ICD-10-CM | POA: Diagnosis not present

## 2023-07-24 MED ORDER — PREDNISONE 10 MG PO TABS
ORAL_TABLET | ORAL | 0 refills | Status: DC
Start: 2023-07-24 — End: 2023-08-28

## 2023-07-24 NOTE — Progress Notes (Signed)
Patient ID: Bradley [name], male    DOB: 1965/11/14, 57 y.o.   MRN: [id number]  Chief Complaint  Patient presents with   Back Pain    Pt c/o lower back pain since last Friday. Has tried ibuprofen which usually helps but did not help this time.    Discussed the use of AI scribe software for clinical note transcription with the patient, who gave verbal consent to proceed.  History of Present Illness   The patient, with a history of four back surgeries, presents with a chronic back pain that has recently flared up. He describes the pain as constant, but with occasional flare-ups that are more intense. The most recent flare-up occurred while tying his shoe, a seemingly innocuous activity. He reports that the pain is located in the L3, L4 region of the lumbar spine and radiates down the left leg, causing numbness from the hip to the knee and tingling down to the toes. The patient has been managing the pain with ibuprofen, taking four every six hours for more than three days, but this has not alleviated the recent flare-up. In the past, he has been prescribed prednisone for flare-ups, which usually helps. He is also taking gabapentin, which helps with the tingling sensation and aids in sleep.       Assessment & Plan:     Chronic Back Pain History of four back surgeries with a diagnosis of herniated nucleus at L3-L4. Currently experiencing a flare-up of pain not responsive to ibuprofen. Numbness and tingling from hip to knee, constant but worse during flare-ups. -Prescribe Prednisone taper pack for 7 days, reminded pt on use & SE. -Advise against taking ibuprofen while on Prednisone. -Continue Gabapentin for chronic tingling and numbness. -Recommend follow-up with neurosurgeon if pain does not improve with Prednisone.     Subjective:    Outpatient Medications Prior to Visit  Medication Sig Dispense Refill   amLODipine (NORVASC) 2.5 MG tablet TAKE 1 TABLET BY MOUTH EVERY DAY 90 tablet 1    atorvastatin (LIPITOR) 10 MG tablet TAKE 1 TABLET BY MOUTH EVERY DAY 90 tablet 3   clonazePAM (KLONOPIN) 0.5 MG tablet Take 1 tablet (0.5 mg total) by mouth at bedtime as needed. for sleep 30 tablet 5   cyanocobalamin (VITAMIN B12) 1000 MCG/ML injection INJECT 1 ML (1,000 MCG TOTAL) INTO THE SKIN EVERY 30 (THIRTY) DAYS. 3 mL 1   Elastic Bandages & Supports (SKINEEZ COMPRESSION ARM SLEEVE) MISC 1 each by Does not apply route daily at 6 (six) AM. 1 each 0   gabapentin (NEURONTIN) 300 MG capsule Take 300 mg by mouth daily as needed.  0   omeprazole (PRILOSEC) 20 MG capsule TAKE 1 CAPSULE BY MOUTH EVERY DAY 90 capsule 3   doxycycline (VIBRA-TABS) 100 MG tablet Take 1 tablet (100 mg total) by mouth 2 (two) times daily. (Patient not taking: Reported on 07/24/2023) 20 tablet 0   HYDROcodone-acetaminophen (NORCO) 5-325 MG tablet Take 1 tablet by mouth 3 (three) times daily as needed. (Patient not taking: Reported on 07/24/2023) 30 tablet 0   ketorolac (TORADOL) 10 MG tablet Take 1 tablet (10 mg total) by mouth every 6 (six) hours as needed. Replace ibuprofen and aleve (Patient not taking: Reported on 07/24/2023) 20 tablet 0   ondansetron (ZOFRAN) 4 MG tablet Take 1 tablet (4 mg total) by mouth every 8 (eight) hours as needed for nausea or vomiting. (Patient not taking: Reported on 07/24/2023) 40 tablet 0   No facility-administered medications prior to  visit.   Past Medical History:  Diagnosis Date   Allergy    Blood transfusion without reported diagnosis    Cough variant asthma 10/12/2007   no per pt 06-19-16   Elevated LFTs    Esophageal reflux    on meds   GERD (gastroesophageal reflux disease)    Herniated nucleus pulposus, L3-4 left 06/20/2019   Hiatal hernia    Hyperlipidemia    Hypertension    on meds   Irritable bowel syndrome    LATERAL EPICONDYLITIS 03/07/2010   Qualifier: Diagnosis of  By: Caryl Never MD, Bruce  left shoulder arthrits per pt 06-19-16   Liver lesion    Lumbar radiculopathy  06/20/2019   Lung mass    Personal history of colonic polyps 09/28/2009    ADENOMATOUS POLYP   PONV (postoperative nausea and vomiting)    Schatzki's ring    Vitamin B 12 deficiency    Past Surgical History:  Procedure Laterality Date   BACK SURGERY  07/12/2019   Dr.Stern   COLONOSCOPY  07/03/2016   COLONOSCOPY  2021   KN-MAC-suprep(exc)-TA-3 yr recall   Exploration Laparotomy and Small Bowel resection     INCISION AND DRAINAGE Right 03/05/2023   Procedure: RIGHT ELBOW INCISION AND DRAINAGE;  Surgeon: Tarry Kos, MD;  Location: [facility];  Service: Orthopedics;  Laterality: Right;   LEG SURGERY     left   LUMBAR LAMINECTOMY     x3 last one June 2017   microdisctomy  07/12/2019   L3-L4   POLYPECTOMY     UPPER GASTROINTESTINAL ENDOSCOPY  07/03/2016   VENTRAL HERNIA REPAIR     VIDEO BRONCHOSCOPY Bilateral 06/10/2016   Procedure: VIDEO BRONCHOSCOPY WITH FLUORO;  Surgeon: Chilton Greathouse, MD;  Location: [facility];  Service: Cardiopulmonary;  Laterality: Bilateral;   VIDEO BRONCHOSCOPY WITH ENDOBRONCHIAL ULTRASOUND N/A 09/08/2016   Procedure: VIDEO BRONCHOSCOPY WITH ENDOBRONCHIAL ULTRASOUND;  Surgeon: Chilton Greathouse, MD;  Location: [facility];  Service: Pulmonary;  Laterality: N/A;   No Known Allergies    Objective:    Physical Exam Vitals and nursing note reviewed.  Constitutional:      General: He is not in acute distress.    Appearance: Normal appearance.  HENT:     Head: Normocephalic.  Cardiovascular:     Rate and Rhythm: Normal rate and regular rhythm.  Pulmonary:     Effort: Pulmonary effort is normal.     Breath sounds: Normal breath sounds.  Musculoskeletal:        General: Normal range of motion.     Cervical back: Normal range of motion.  Skin:    General: Skin is warm and dry.  Neurological:     Mental Status: He is alert and oriented to person, place, and time.  Psychiatric:        Mood and Affect: Mood normal.    BP (!) 156/89 (BP  Location: Left Arm, Patient Position: Sitting, Cuff Size: Large)   Pulse 75   Temp 98 F (36.7 C) (Temporal)   Ht 5\' 11"  (1.803 m)   Wt 204 lb 6.4 oz (92.7 kg)   SpO2 96%   BMI 28.51 kg/m  Wt Readings from Last 3 Encounters:  07/24/23 204 lb 6.4 oz (92.7 kg)  03/05/23 197 lb 8.5 oz (89.6 kg)  02/23/23 207 lb 12.8 oz (94.3 kg)       Dulce Sellar, NP

## 2023-08-17 ENCOUNTER — Ambulatory Visit
Admission: RE | Admit: 2023-08-17 | Discharge: 2023-08-17 | Disposition: A | Discharge status: Home / Self Care | Payer: BC Managed Care – PPO | Source: Ambulatory Visit | Attending: Family Medicine | Admitting: Family Medicine

## 2023-08-17 VITALS — BP 166/98 | HR 75 | Temp 97.9°F | Resp 15

## 2023-08-17 DIAGNOSIS — J3489 Other specified disorders of nose and nasal sinuses: Secondary | ICD-10-CM

## 2023-08-17 DIAGNOSIS — K047 Periapical abscess without sinus: Secondary | ICD-10-CM

## 2023-08-17 MED ORDER — AMOXICILLIN-POT CLAVULANATE 875-125 MG PO TABS: 1.0000 | ORAL_TABLET | Freq: Two times a day (BID) | ORAL | 0 refills | Status: DC

## 2023-08-17 NOTE — ED Provider Notes (Signed)
[facility]    CSN: [id number] Arrival date & time: 08/17/23  1544      History   Chief Complaint Chief Complaint  Patient presents with   Dental Problem    Entered by patient    HPI Bradley [name] is a 57 y.o. male.   Presenting today with several day history of right upper dental pain and now right sinus pain and nasal burning.  Denies congestion, fever, cough, sore throat, bleeding or drainage to the mouth.  So far not tried anything over-the-counter for symptoms.    Past Medical History:  Diagnosis Date   Allergy    Blood transfusion without reported diagnosis    Cough variant asthma 10/12/2007   no per pt 06-19-16   Elevated LFTs    Esophageal reflux    on meds   GERD (gastroesophageal reflux disease)    Herniated nucleus pulposus, L3-4 left 06/20/2019   Hiatal hernia    Hyperlipidemia    Hypertension    on meds   Irritable bowel syndrome    LATERAL EPICONDYLITIS 03/07/2010   Qualifier: Diagnosis of  By: Caryl Never MD, Bruce  left shoulder arthrits per pt 06-19-16   Liver lesion    Lumbar radiculopathy 06/20/2019   Lung mass    Personal history of colonic polyps 09/28/2009    ADENOMATOUS POLYP   PONV (postoperative nausea and vomiting)    Schatzki's ring    Vitamin B 12 deficiency     Patient Active Problem List   Diagnosis Date Noted   Septic olecranon bursitis, right 03/05/2023   Aortic atherosclerosis (HCC) 08/25/2022   Herniated nucleus pulposus, L3-4 left 06/20/2019   Hemangioma of liver 11/24/2017   Pulmonary nodules 06/03/2016   History of small bowel obstruction 04/11/2016   Insomnia 05/10/2015   Gallstones 05/10/2015   Essential hypertension 05/10/2015   History of colonic polyps 10/03/2010   B12 deficiency 09/05/2009   IBS 08/31/2009   ALLERGIC RHINITIS, SEASONAL 07/24/2009   External hemorrhoids 06/07/2008   Hyperlipidemia 05/12/2007   GERD 05/12/2007    Past Surgical History:  Procedure Laterality Date   BACK SURGERY   07/12/2019   Dr.Stern   COLONOSCOPY  07/03/2016   COLONOSCOPY  2021   KN-MAC-suprep(exc)-TA-3 yr recall   Exploration Laparotomy and Small Bowel resection     INCISION AND DRAINAGE Right 03/05/2023   Procedure: RIGHT ELBOW INCISION AND DRAINAGE;  Surgeon: Tarry Kos, MD;  Location: [facility];  Service: Orthopedics;  Laterality: Right;   LEG SURGERY     left   LUMBAR LAMINECTOMY     x3 last one June 2017   microdisctomy  07/12/2019   L3-L4   POLYPECTOMY     UPPER GASTROINTESTINAL ENDOSCOPY  07/03/2016   VENTRAL HERNIA REPAIR     VIDEO BRONCHOSCOPY Bilateral 06/10/2016   Procedure: VIDEO BRONCHOSCOPY WITH FLUORO;  Surgeon: Chilton Greathouse, MD;  Location: [facility];  Service: Cardiopulmonary;  Laterality: Bilateral;   VIDEO BRONCHOSCOPY WITH ENDOBRONCHIAL ULTRASOUND N/A 09/08/2016   Procedure: VIDEO BRONCHOSCOPY WITH ENDOBRONCHIAL ULTRASOUND;  Surgeon: Chilton Greathouse, MD;  Location: [facility];  Service: Pulmonary;  Laterality: N/A;       Home Medications    Prior to Admission medications   Medication Sig Start Date End Date Taking? Authorizing Provider  amoxicillin-clavulanate (AUGMENTIN) 875-125 MG tablet Take 1 tablet by mouth every 12 (twelve) hours. 08/17/23  Yes Particia Nearing, PA-C  amLODipine (NORVASC) 2.5 MG tablet TAKE 1 TABLET BY MOUTH EVERY DAY 04/06/23  Shelva Majestic, MD  atorvastatin (LIPITOR) 10 MG tablet TAKE 1 TABLET BY MOUTH EVERY DAY 04/06/23   Shelva Majestic, MD  clonazePAM (KLONOPIN) 0.5 MG tablet Take 1 tablet (0.5 mg total) by mouth at bedtime as needed. for sleep 08/25/22   Shelva Majestic, MD  cyanocobalamin (VITAMIN B12) 1000 MCG/ML injection INJECT 1 ML (1,000 MCG TOTAL) INTO THE SKIN EVERY 30 (THIRTY) DAYS. 04/06/23   Shelva Majestic, MD  Elastic Bandages & Supports [facility] COMPRESSION ARM SLEEVE) MISC 1 each by Does not apply route daily at 6 (six) AM. 02/23/23   Lula Olszewski, MD  gabapentin (NEURONTIN) 300 MG capsule  Take 300 mg by mouth daily as needed. 12/22/17   [provider]  omeprazole (PRILOSEC) 20 MG capsule TAKE 1 CAPSULE BY MOUTH EVERY DAY 07/07/23   Shelva Majestic, MD  ondansetron (ZOFRAN) 4 MG tablet Take 1 tablet (4 mg total) by mouth every 8 (eight) hours as needed for nausea or vomiting. Patient not taking: Reported on 07/24/2023 03/03/23   Cristie Hem, PA-C  predniSONE (DELTASONE) 10 MG tablet Take with breakfast:  6 tab day 1, 5 tab day 2-3, 4 tab day 4, 3 tab day 5, 2 tab day 6-7. 07/24/23   Dulce Sellar, NP  bisoprolol-hydrochlorothiazide (ZIAC) 2.5-6.25 MG per tablet Take 1 tablet by mouth daily. 10/28/11 11/28/11  Stacie Glaze, MD    Family History Family History  Problem Relation Age of Onset   Breast cancer Mother    Thyroid cancer Mother    Healthy Father    Colon cancer Neg Hx    Esophageal cancer Neg Hx    Rectal cancer Neg Hx    Stomach cancer Neg Hx    Colon polyps Neg Hx     Social History Social History   Tobacco Use   Smoking status: Never   Smokeless tobacco: Never  Vaping Use   Vaping status: Never Used  Substance Use Topics   Alcohol use: Yes    Alcohol/week: 0.0 - 4.0 standard drinks of alcohol    Comment: 3-4 times per month per pt   Drug use: No     Allergies   Patient has no known allergies.   Review of Systems Review of Systems Per HPI  Physical Exam Triage Vital Signs ED Triage Vitals  Encounter Vitals Group     BP 08/17/23 1635 (!) 166/98     Systolic BP Percentile --      Diastolic BP Percentile --      Pulse Rate 08/17/23 1635 75     Resp 08/17/23 1635 15     Temp 08/17/23 1635 97.9 F (36.6 C)     Temp src --      SpO2 08/17/23 1635 96 %     Weight --      Height --      Head Circumference --      Peak Flow --      Pain Score 08/17/23 1638 7     Pain Loc --      Pain Education --      Exclude from Growth Chart --    No data found.  Updated Vital Signs BP (!) 166/98   Pulse 75   Temp 97.9 F (36.6  C)   Resp 15   SpO2 96%   Visual Acuity Right Eye Distance:   Left Eye Distance:   Bilateral Distance:    Right Eye Near:   Left  Eye Near:    Bilateral Near:     Physical Exam Vitals and nursing note reviewed.  Constitutional:      Appearance: Normal appearance.  HENT:     Head: Atraumatic.     Nose: Nose normal.     Mouth/Throat:     Mouth: Mucous membranes are moist.     Pharynx: Oropharynx is clear.     Comments: Mild gingival erythema, edema to the right upper anterior molar Eyes:     Extraocular Movements: Extraocular movements intact.     Conjunctiva/sclera: Conjunctivae normal.  Cardiovascular:     Rate and Rhythm: Normal rate and regular rhythm.  Pulmonary:     Effort: Pulmonary effort is normal.     Breath sounds: Normal breath sounds.  Musculoskeletal:        General: Normal range of motion.     Cervical back: Normal range of motion and neck supple.  Skin:    General: Skin is warm and dry.  Neurological:     General: No focal deficit present.     Mental Status: He is oriented to person, place, and time.  Psychiatric:        Mood and Affect: Mood normal.        Thought Content: Thought content normal.        Judgment: Judgment normal.      UC Treatments / Results  Labs (all labs ordered are listed, but only abnormal results are displayed) Labs Reviewed - No data to display  EKG   Radiology No results found.  Procedures Procedures (including critical care time)  Medications Ordered in UC Medications - No data to display  Initial Impression / Assessment and Plan / UC Course  I have reviewed the triage vital signs and the nursing notes.  Pertinent labs & imaging results that were available during my care of the patient were reviewed by me and considered in my medical decision making (see chart for details).     Treat possible dental infection with Augmentin, discussed salt water gargles, over-the-counter pain relievers, Flonase for  possible sinus pressure.  Supportive home care and return precautions reviewed.   Final Clinical Impressions(s) / UC Diagnoses   Final diagnoses:  Dental infection  Sinus pain   Discharge Instructions   None    ED Prescriptions     Medication Sig Dispense Auth. Provider   amoxicillin-clavulanate (AUGMENTIN) 875-125 MG tablet Take 1 tablet by mouth every 12 (twelve) hours. 14 tablet Particia Nearing, [place]      PDMP not reviewed this encounter.   Particia Nearing, [place] 08/17/23 1738

## 2023-08-17 NOTE — ED Triage Notes (Signed)
Pt reports facial pain on the right side, with burning and pain. Pressure behind the right eye. Pt states he also has dental pain in this area so is unsure if the two are related.

## 2023-08-28 ENCOUNTER — Encounter: Payer: Self-pay | Admitting: Family Medicine

## 2023-08-28 ENCOUNTER — Ambulatory Visit (INDEPENDENT_AMBULATORY_CARE_PROVIDER_SITE_OTHER): Payer: BC Managed Care – PPO | Admitting: Family Medicine

## 2023-08-28 VITALS — BP 130/80 | HR 81 | Temp 97.3°F | Ht 71.0 in | Wt 199.4 lb

## 2023-08-28 DIAGNOSIS — E785 Hyperlipidemia, unspecified: Secondary | ICD-10-CM

## 2023-08-28 DIAGNOSIS — I10 Essential (primary) hypertension: Secondary | ICD-10-CM

## 2023-08-28 DIAGNOSIS — E538 Deficiency of other specified B group vitamins: Secondary | ICD-10-CM

## 2023-08-28 DIAGNOSIS — Z0000 Encounter for general adult medical examination without abnormal findings: Secondary | ICD-10-CM

## 2023-08-28 DIAGNOSIS — Z131 Encounter for screening for diabetes mellitus: Secondary | ICD-10-CM

## 2023-08-28 DIAGNOSIS — Z125 Encounter for screening for malignant neoplasm of prostate: Secondary | ICD-10-CM | POA: Diagnosis not present

## 2023-08-28 DIAGNOSIS — E663 Overweight: Secondary | ICD-10-CM

## 2023-08-28 DIAGNOSIS — I700 Atherosclerosis of aorta: Secondary | ICD-10-CM

## 2023-08-28 LAB — COMPREHENSIVE METABOLIC PANEL
ALT: 47 U/L (ref 0–53)
AST: 25 U/L (ref 0–37)
Albumin: 4.6 g/dL (ref 3.5–5.2)
Alkaline Phosphatase: 84 U/L (ref 39–117)
BUN: 19 mg/dL (ref 6–23)
CO2: 29 meq/L (ref 19–32)
Calcium: 9.3 mg/dL (ref 8.4–10.5)
Chloride: 105 meq/L (ref 96–112)
Creatinine, Ser: 0.92 mg/dL (ref 0.40–1.50)
GFR: 91.36 mL/min (ref >60.00)
Glucose, Bld: 100 mg/dL — ABNORMAL HIGH (ref 70–99)
Potassium: 4.2 meq/L (ref 3.5–5.1)
Sodium: 141 meq/L (ref 135–145)
Total Bilirubin: 0.7 mg/dL (ref 0.2–1.2)
Total Protein: 6.6 g/dL (ref 6.0–8.3)

## 2023-08-28 LAB — CBC WITH DIFFERENTIAL/PLATELET
Basophils Absolute: 0 10*3/uL (ref 0.0–0.1)
Basophils Relative: 0.4 % (ref 0.0–3.0)
Eosinophils Absolute: 0 10*3/uL (ref 0.0–0.7)
Eosinophils Relative: 0.9 % (ref 0.0–5.0)
HCT: 47.6 % (ref 39.0–52.0)
Hemoglobin: 17 g/dL (ref 13.0–17.0)
Lymphocytes Relative: 22.4 % (ref 12.0–46.0)
Lymphs Abs: 1.1 10*3/uL (ref 0.7–4.0)
MCHC: 35.7 g/dL (ref 30.0–36.0)
MCV: 93.5 fL (ref 78.0–100.0)
Monocytes Absolute: 0.4 10*3/uL (ref 0.1–1.0)
Monocytes Relative: 8.2 % (ref 3.0–12.0)
Neutro Abs: 3.4 10*3/uL (ref 1.4–7.7)
Neutrophils Relative %: 68.1 % (ref 43.0–77.0)
Platelets: 226 10*3/uL (ref 150.0–400.0)
RBC: 5.09 Mil/uL (ref 4.22–5.81)
RDW: 13 % (ref 11.5–15.5)
WBC: 4.9 10*3/uL (ref 4.0–10.5)

## 2023-08-28 LAB — LIPID PANEL
Cholesterol: 200 mg/dL (ref 0–200)
HDL: 40.3 mg/dL (ref >39.00)
LDL Cholesterol: 131 mg/dL — ABNORMAL HIGH (ref 0–99)
NonHDL: 159.9
Total CHOL/HDL Ratio: 5
Triglycerides: 147 mg/dL (ref 0.0–149.0)
VLDL: 29.4 mg/dL (ref 0.0–40.0)

## 2023-08-28 LAB — VITAMIN B12: Vitamin B-12: 387 pg/mL (ref 211–911)

## 2023-08-28 LAB — HEMOGLOBIN A1C: Hgb A1c MFr Bld: 5.6 % (ref 4.6–6.5)

## 2023-08-28 LAB — PSA: PSA: 2.15 ng/mL (ref 0.10–4.00)

## 2023-08-28 MED ORDER — CLONAZEPAM 0.5 MG PO TABS: 0.5000 mg | ORAL_TABLET | Freq: Every evening | ORAL | 5 refills | Status: DC | PRN

## 2023-08-28 NOTE — Patient Instructions (Addendum)
Please stop by lab before you go If you have mychart- we will send your results within 3 business days of [place] receiving them.  If you do not have mychart- we will call you about results within 5 business days of [place] receiving them.  *please also note that you will see labs on mychart as soon as they post. I will later go in and write notes on them- will say "notes from Dr. Durene Cal"   Recommended follow up: Return in about 1 year (around 08/27/2024) for physical or sooner if needed.Schedule b4 you leave.

## 2023-08-28 NOTE — Progress Notes (Signed)
Phone: [phone]   Subjective:  Patient presents today for their annual physical. Chief complaint-noted.   See problem oriented charting- ROS- full  review of systems was completed and negative  Per full ROS sheet completed by patient- some lingering back pain and elbow pain but improving, some poor sleep, reflux issues  The following were reviewed and entered/updated in epic: Past Medical History:  Diagnosis Date   Allergy    Blood transfusion without reported diagnosis    Cough variant asthma 10/12/2007   no per pt 06-19-16   Elevated LFTs    Esophageal reflux    on meds   GERD (gastroesophageal reflux disease)    Herniated nucleus pulposus, L3-4 left 06/20/2019   Hiatal hernia    Hyperlipidemia    Hypertension    on meds   Irritable bowel syndrome    LATERAL EPICONDYLITIS 03/07/2010   Qualifier: Diagnosis of  By: Caryl Never MD, Bruce  left shoulder arthrits per pt 06-19-16   Liver lesion    Lumbar radiculopathy 06/20/2019   Lung mass    Personal history of colonic polyps 09/28/2009    ADENOMATOUS POLYP   PONV (postoperative nausea and vomiting)    Schatzki's ring    Vitamin B 12 deficiency    Patient Active Problem List   Diagnosis Date Noted   Herniated nucleus pulposus, L3-4 left 06/20/2019    Priority: High   History of small bowel obstruction 04/11/2016    Priority: High   Aortic atherosclerosis (HCC) 08/25/2022    Priority: Medium    Pulmonary nodules 06/03/2016    Priority: Medium    Insomnia 05/10/2015    Priority: Medium    Essential hypertension 05/10/2015    Priority: Medium    Hyperlipidemia 05/12/2007    Priority: Medium    GERD 05/12/2007    Priority: Medium    Hemangioma of liver 11/24/2017    Priority: Low   Gallstones 05/10/2015    Priority: Low   History of colonic polyps 10/03/2010    Priority: Low   B12 deficiency 09/05/2009    Priority: Low   IBS 08/31/2009    Priority: Low   ALLERGIC RHINITIS, SEASONAL 07/24/2009    Priority:  Low   External hemorrhoids 06/07/2008    Priority: Low   Septic olecranon bursitis, right 03/05/2023   Past Surgical History:  Procedure Laterality Date   BACK SURGERY  07/12/2019   Dr.Stern   COLONOSCOPY  07/03/2016   COLONOSCOPY  2021   KN-MAC-suprep(exc)-TA-3 yr recall   Exploration Laparotomy and Small Bowel resection     INCISION AND DRAINAGE Right 03/05/2023   Procedure: RIGHT ELBOW INCISION AND DRAINAGE;  Surgeon: Tarry Kos, MD;  Location: [facility];  Service: Orthopedics;  Laterality: Right;   LEG SURGERY     left   LUMBAR LAMINECTOMY     x3 last one June 2017   microdisctomy  07/12/2019   L3-L4   POLYPECTOMY     SPINE SURGERY     UPPER GASTROINTESTINAL ENDOSCOPY  07/03/2016   VENTRAL HERNIA REPAIR     VIDEO BRONCHOSCOPY Bilateral 06/10/2016   Procedure: VIDEO BRONCHOSCOPY WITH FLUORO;  Surgeon: Chilton Greathouse, MD;  Location: [facility];  Service: Cardiopulmonary;  Laterality: Bilateral;   VIDEO BRONCHOSCOPY WITH ENDOBRONCHIAL ULTRASOUND N/A 09/08/2016   Procedure: VIDEO BRONCHOSCOPY WITH ENDOBRONCHIAL ULTRASOUND;  Surgeon: Chilton Greathouse, MD;  Location: [facility];  Service: Pulmonary;  Laterality: N/A;    Family History  Problem Relation Age of Onset   Breast  cancer Mother    Thyroid cancer Mother    Healthy Father    Colon cancer Neg Hx    Esophageal cancer Neg Hx    Rectal cancer Neg Hx    Stomach cancer Neg Hx    Colon polyps Neg Hx     Medications- reviewed and updated Current Outpatient Medications  Medication Sig Dispense Refill   amLODipine (NORVASC) 2.5 MG tablet TAKE 1 TABLET BY MOUTH EVERY DAY 90 tablet 1   atorvastatin (LIPITOR) 10 MG tablet TAKE 1 TABLET BY MOUTH EVERY DAY 90 tablet 3   cyanocobalamin (VITAMIN B12) 1000 MCG/ML injection INJECT 1 ML (1,000 MCG TOTAL) INTO THE SKIN EVERY 30 (THIRTY) DAYS. 3 mL 1   omeprazole (PRILOSEC) 20 MG capsule TAKE 1 CAPSULE BY MOUTH EVERY DAY 90 capsule 3   clonazePAM (KLONOPIN) 0.5 MG  tablet Take 1 tablet (0.5 mg total) by mouth at bedtime as needed. for sleep (Patient not taking: Reported on 08/28/2023) 30 tablet 5   No current facility-administered medications for this visit.    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 Oropharynx normal Neck: no thyromegaly CV: RRR no murmurs rubs or gallops Lungs: CTAB no crackles, wheeze, rhonchi Abdomen: soft/nontender/nondistended/normal bowel sounds. No rebound or guarding. Diastasis recti noted Ext: no edema Skin: warm, dry Neuro: grossly normal, moves all extremities, PERRLA Declines genitourinary and rectal exam   Assessment and Plan  57 y.o. male presenting for annual physical.  Health Maintenance counseling: 1. Anticipatory guidance: Patient counseled regarding regular dental exams -q6 months, eye exams -yearly,  avoiding smoking and second hand smoke , limiting alcohol to 2 beverages per day - max 6 a week, no illicit drugs .   2. Risk factor reduction:  Advised patient of need for regular exercise and diet rich and fruits and vegetables to reduce risk of heart attack and stroke.  Exercise- 15k steps at work still.  Diet/weight management-down 2 lbs from last year- reasonably healthy diet. Mild weight loss advised Wt Readings from Last 3 Encounters:  08/28/23 199 lb 6.4 oz (90.4 kg)  07/24/23 204 lb 6.4 oz (92.7 kg)  03/05/23 197 lb 8.5 oz (89.6 kg)  3. Immunizations/screenings/ancillary studies-fully up to date- declines COVID 19.   Immunization History  Administered Date(s) Administered   Influenza Inj Mdck Quad With  Preservative 07/29/2019   Influenza Split 07/14/2011   Influenza, Seasonal, Injecte, Preservative Fre 07/05/2023   Influenza,inj,Quad PF,6+ Mos 06/27/2013, 07/30/2015, 06/17/2016, 08/19/2017, 06/24/2018, 08/20/2020, 08/25/2022   PFIZER(Purple Top)SARS-COV-2 Vaccination 04/26/2020, 05/17/2020   Td 06/23/1999, 08/25/2008   Tdap 06/21/2016   Zoster Recombinant(Shingrix) 07/11/2022, 10/18/2022   4. Prostate cancer screening-  low risk prior trend- update psa today . Nocturia stable once a night Lab Results  Component Value Date   PSA 1.53 08/25/2022   PSA 1.45 08/22/2021   PSA 1.25 08/20/2020   5. Colon cancer screening - Colonoscopy April 2024 with 3-year repeat with Dr. Lavon Paganini 6. Skin cancer screening- saw dermatology in February- scheduled again in February  dermatology specialists.  advised regular sunscreen use. Denies worrisome, changing, or new skin lesions.  7. Smoking associated screening (lung cancer screening, AAA screen 65-75, UA)- never smoker 8. STD screening -  only active with wife  Status of chronic or acute concerns   # Recent dental infection-treated at urgent care with Augmentin on 08/17/2023- in the end thought it was more sinusitis- Augmentin would treat that as well- he feels much better.   #Low back pain-history of neurosurgery Dr. Venetia Maxon July 12, 2019 with microdiscectomy L3-L4.  Patient remains on gabapentin through neurosurgery -Flare of back pain in early November which required prednisone- very helpful   # Septic olecranon bursitis-failed 2 rounds of antibiotics and required surgery on March 05, 2023 with Dr. Roda Shutters  #hypertension S: medication: Amlodipine 2.5 mg. Was high with sinus and back issues on last 2 visits BP Readings from Last 3 Encounters:  08/28/23 130/80  08/17/23 (!) 166/98  07/24/23 (!) 156/89  A/P: stable- continue current medicines    #hyperlipidemia- peak LDL 191 #aortic atherosclerosis  S: Medication:Atorvastatin 10 mg Lab Results   Component Value Date   CHOL 194 08/25/2022   HDL 48.20 08/25/2022   LDLCALC 107 (H) 08/25/2022   LDLDIRECT 145.0 07/22/2016   TRIG 191.0 (H) 08/25/2022   CHOLHDL 4 08/25/2022  A/P: really good improvement in LDL from peak down from 191 to 107 last year on statin- with aortic atherosclerosis we opted to at least push for under 100- if not at goal may go up to atorvastatin 20 mg- we did discuss some risk of developing diabetes on statins  # GERD S:Medication: On Dexilant  60 mg with worsening in 2023 but was able to go back down to omeprazole 20 mg daily- sometimes needs twice A/P: doing well with only omeprazole- occasional twice a day still    #B12 deficiency S: Patient receives monthly B12 injections at home.  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  Lab/Order associations: fasting   ICD-10-CM   1. Preventative health care  Z00.00     2. Essential hypertension  I10     3. Hyperlipidemia, unspecified hyperlipidemia type  E78.5     4. B12 deficiency  E53.8     5. Screening for diabetes mellitus  Z13.1     6. Overweight  E66.3     7. Screening for prostate cancer  Z12.5       No orders of the defined types were placed in this encounter.   Return precautions advised.  Tana Conch, MD

## 2023-09-01 ENCOUNTER — Other Ambulatory Visit: Payer: Self-pay

## 2023-09-01 DIAGNOSIS — R972 Elevated prostate specific antigen [PSA]: Secondary | ICD-10-CM

## 2023-09-29 ENCOUNTER — Other Ambulatory Visit: Payer: Self-pay | Admitting: Family Medicine

## 2023-09-29 ENCOUNTER — Ambulatory Visit (INDEPENDENT_AMBULATORY_CARE_PROVIDER_SITE_OTHER): Payer: BC Managed Care – PPO

## 2023-09-29 ENCOUNTER — Ambulatory Visit
Admission: RE | Admit: 2023-09-29 | Discharge: 2023-09-29 | Disposition: A | Discharge status: Home / Self Care | Payer: BC Managed Care – PPO | Source: Ambulatory Visit | Attending: Nurse Practitioner | Admitting: Nurse Practitioner

## 2023-09-29 VITALS — BP 139/87 | HR 94 | Temp 98.8°F | Resp 17

## 2023-09-29 DIAGNOSIS — R059 Cough, unspecified: Secondary | ICD-10-CM | POA: Diagnosis not present

## 2023-09-29 DIAGNOSIS — J189 Pneumonia, unspecified organism: Secondary | ICD-10-CM

## 2023-09-29 DIAGNOSIS — R051 Acute cough: Secondary | ICD-10-CM

## 2023-09-29 MED ORDER — AMOXICILLIN-POT CLAVULANATE 875-125 MG PO TABS: 1.0000 | ORAL_TABLET | Freq: Two times a day (BID) | ORAL | 0 refills | Status: AC

## 2023-09-29 MED ORDER — BENZONATATE 100 MG PO CAPS: 100.0000 mg | ORAL_CAPSULE | Freq: Three times a day (TID) | ORAL | 0 refills | Status: DC | PRN

## 2023-09-29 MED ORDER — AZITHROMYCIN 250 MG PO TABS: ORAL_TABLET | ORAL | 0 refills | Status: DC

## 2023-09-29 NOTE — ED Provider Notes (Signed)
 [facility]    CSN: [id number] Arrival date & time: 09/29/23  0854      History   Chief Complaint Chief Complaint  Patient presents with   Cough    And congestion - Entered by patient    HPI Bradley [name] is a 58 y.o. male.   Patient presents today with 4 to 5-day history of bodyaches and chills, congested cough, shortness of breath and chest pain from coughing.  Also has a sore throat from coughing, decreased appetite, headache, and fatigue.  No known fevers, nasal congestion, runny nose, ear pain, abdominal pain, vomiting/diarrhea.  Has been taking Robitussin DM without much improvement in symptoms.  Denies history of chronic lung disease or antibiotic use in the past 90 days.     Past Medical History:  Diagnosis Date   Allergy    Blood transfusion without reported diagnosis    Cough variant asthma 10/12/2007   no per pt 06-19-16   Elevated LFTs    Esophageal reflux    on meds   GERD (gastroesophageal reflux disease)    Herniated nucleus pulposus, L3-4 left 06/20/2019   Hiatal hernia    Hyperlipidemia    Hypertension    on meds   Irritable bowel syndrome    LATERAL EPICONDYLITIS 03/07/2010   Qualifier: Diagnosis of  By: Micheal MD, Bruce  left shoulder arthrits per pt 06-19-16   Liver lesion    Lumbar radiculopathy 06/20/2019   Lung mass    Personal history of colonic polyps 09/28/2009    ADENOMATOUS POLYP   PONV (postoperative nausea and vomiting)    Schatzki's ring    Vitamin B 12 deficiency     Patient Active Problem List   Diagnosis Date Noted   Septic olecranon bursitis, right 03/05/2023   Aortic atherosclerosis (HCC) 08/25/2022   Herniated nucleus pulposus, L3-4 left 06/20/2019   Hemangioma of liver 11/24/2017   Pulmonary nodules 06/03/2016   History of small bowel obstruction 04/11/2016   Insomnia 05/10/2015   Gallstones 05/10/2015   Essential hypertension 05/10/2015   History of colonic polyps 10/03/2010   B12 deficiency 09/05/2009    IBS 08/31/2009   ALLERGIC RHINITIS, SEASONAL 07/24/2009   External hemorrhoids 06/07/2008   Hyperlipidemia 05/12/2007   GERD 05/12/2007    Past Surgical History:  Procedure Laterality Date   BACK SURGERY  07/12/2019   Dr.Stern   COLONOSCOPY  07/03/2016   COLONOSCOPY  2021   KN-MAC-suprep(exc)-TA-3 yr recall   Exploration Laparotomy and Small Bowel resection     INCISION AND DRAINAGE Right 03/05/2023   Procedure: RIGHT ELBOW INCISION AND DRAINAGE;  Surgeon: Jerri Kay HERO, MD;  Location: [facility];  Service: Orthopedics;  Laterality: Right;   LEG SURGERY     left   LUMBAR LAMINECTOMY     x3 last one June 2017   microdisctomy  07/12/2019   L3-L4   POLYPECTOMY     SPINE SURGERY     UPPER GASTROINTESTINAL ENDOSCOPY  07/03/2016   VENTRAL HERNIA REPAIR     VIDEO BRONCHOSCOPY Bilateral 06/10/2016   Procedure: VIDEO BRONCHOSCOPY WITH FLUORO;  Surgeon: Lonna Coder, MD;  Location: [facility];  Service: Cardiopulmonary;  Laterality: Bilateral;   VIDEO BRONCHOSCOPY WITH ENDOBRONCHIAL ULTRASOUND N/A 09/08/2016   Procedure: VIDEO BRONCHOSCOPY WITH ENDOBRONCHIAL ULTRASOUND;  Surgeon: Lonna Coder, MD;  Location: [facility];  Service: Pulmonary;  Laterality: N/A;       Home Medications    Prior to Admission medications   Medication Sig Start  Date End Date Taking? Authorizing Provider  amoxicillin -clavulanate (AUGMENTIN ) 875-125 MG tablet Take 1 tablet by mouth 2 (two) times daily for 7 days. 09/29/23 10/06/23 Yes Chandra Harlene LABOR, NP  azithromycin  (ZITHROMAX ) 250 MG tablet Take (2) tablets by mouth on day 1, then take (1) tablet by mouth on days 2-5. 09/29/23  Yes Chandra Harlene LABOR, NP  benzonatate  (TESSALON ) 100 MG capsule Take 1 capsule (100 mg total) by mouth 3 (three) times daily as needed for cough. Do not take with alcohol or while driving or operating heavy machinery.  May cause drowsiness. 09/29/23  Yes Chandra Harlene LABOR, NP  amLODipine  (NORVASC ) 2.5 MG tablet TAKE  1 TABLET BY MOUTH EVERY DAY 09/29/23   Katrinka Garnette KIDD, MD  atorvastatin  (LIPITOR) 10 MG tablet TAKE 1 TABLET BY MOUTH EVERY DAY 04/06/23   Katrinka Garnette KIDD, MD  clonazePAM  (KLONOPIN ) 0.5 MG tablet Take 1 tablet (0.5 mg total) by mouth at bedtime as needed. for sleep 08/28/23   Katrinka Garnette KIDD, MD  cyanocobalamin  (VITAMIN B12) 1000 MCG/ML injection INJECT 1 ML (1,000 MCG TOTAL) INTO THE SKIN EVERY 30 (THIRTY) DAYS. 04/06/23   Katrinka Garnette KIDD, MD  omeprazole  (PRILOSEC) 20 MG capsule TAKE 1 CAPSULE BY MOUTH EVERY DAY 07/07/23   Katrinka Garnette KIDD, MD  bisoprolol -hydrochlorothiazide  (ZIAC ) 2.5-6.25 MG per tablet Take 1 tablet by mouth daily. 10/28/11 11/28/11  Mavis Norleen BRAVO, MD    Family History Family History  Problem Relation Age of Onset   Breast cancer Mother    Thyroid cancer Mother    Healthy Father    Colon cancer Neg Hx    Esophageal cancer Neg Hx    Rectal cancer Neg Hx    Stomach cancer Neg Hx    Colon polyps Neg Hx     Social History Social History   Tobacco Use   Smoking status: Never   Smokeless tobacco: Never  Vaping Use   Vaping status: Never Used  Substance Use Topics   Alcohol use: Yes    Alcohol/week: 6.0 standard drinks of alcohol    Types: 6 Standard drinks or equivalent per week    Comment: 3-4 times per month per pt   Drug use: No     Allergies   Patient has no known allergies.   Review of Systems Review of Systems Per HPI  Physical Exam Triage Vital Signs ED Triage Vitals  Encounter Vitals Group     BP 09/29/23 0900 139/87     Systolic BP Percentile --      Diastolic BP Percentile --      Pulse Rate 09/29/23 0900 94     Resp 09/29/23 0900 17     Temp 09/29/23 0900 98.8 F (37.1 C)     Temp Source 09/29/23 0900 Oral     SpO2 09/29/23 0900 95 %     Weight --      Height --      Head Circumference --      Peak Flow --      Pain Score 09/29/23 0901 0     Pain Loc --      Pain Education --      Exclude from Growth Chart --    No data  found.  Updated Vital Signs BP 139/87 (BP Location: Right Arm)   Pulse 94   Temp 98.8 F (37.1 C) (Oral)   Resp 17   SpO2 95%   Visual Acuity Right Eye Distance:   Left Eye Distance:  Bilateral Distance:    Right Eye Near:   Left Eye Near:    Bilateral Near:     Physical Exam Vitals and nursing note reviewed.  Constitutional:      General: He is not in acute distress.    Appearance: Normal appearance. He is not ill-appearing or toxic-appearing.  HENT:     Head: Normocephalic and atraumatic.     Right Ear: Tympanic membrane, ear canal and external ear normal.     Left Ear: Tympanic membrane, ear canal and external ear normal.     Nose: Congestion present. No rhinorrhea.     Mouth/Throat:     Mouth: Mucous membranes are moist.     Pharynx: Oropharynx is clear. No oropharyngeal exudate or posterior oropharyngeal erythema.  Eyes:     General: No scleral icterus.    Extraocular Movements: Extraocular movements intact.  Cardiovascular:     Rate and Rhythm: Normal rate and regular rhythm.  Pulmonary:     Effort: Pulmonary effort is normal. No respiratory distress.     Breath sounds: Examination of the left-upper field reveals rhonchi. Examination of the left-middle field reveals rhonchi. Examination of the left-lower field reveals rhonchi. Rhonchi present. No wheezing or rales.  Musculoskeletal:     Cervical back: Normal range of motion and neck supple.  Lymphadenopathy:     Cervical: No cervical adenopathy.  Skin:    General: Skin is warm and dry.     Coloration: Skin is not jaundiced or pale.     Findings: No erythema or rash.  Neurological:     Mental Status: He is alert and oriented to person, place, and time.  Psychiatric:        Behavior: Behavior is cooperative.      UC Treatments / Results  Labs (all labs ordered are listed, but only abnormal results are displayed) Labs Reviewed - No data to display  EKG   Radiology DG Chest 2 View Result Date:  09/29/2023 CLINICAL DATA:  Cough for 5 days. EXAM: CHEST - 2 VIEW COMPARISON:  Chest radiograph dated 09/08/2016. FINDINGS: Focal area of airspace opacity in the posterior left lower lobe most consistent with pneumonia. Clinical correlation and follow-up to resolution recommended. The right lung is clear. No pleural effusion or pneumothorax. The cardiac silhouette is within normal limits. No acute osseous pathology. IMPRESSION: Left lower lobe pneumonia.  Follow-up to resolution recommended. Electronically Signed   By: Vanetta Chou M.D.   On: 09/29/2023 09:46    Procedures Procedures (including critical care time)  Medications Ordered in UC Medications - No data to display  Initial Impression / Assessment and Plan / UC Course  I have reviewed the triage vital signs and the nursing notes.  Pertinent labs & imaging results that were available during my care of the patient were reviewed by me and considered in my medical decision making (see chart for details).   Patient is well-appearing, normotensive, afebrile, not tachycardic, not tachypneic, oxygenating well on room air.    1. Acute cough 2. Pneumonia of left lower lobe due to infectious organism Vitals and examination are reassuring Chest x-ray shows pneumonia of left lower lobe  Given history of pulmonary nodules, will treat patient with Augmentin  and azithromycin  Recommended follow up with PCP in 6 weeks to ensure full resolution Other supportive care discussed including Mucinex and cough suppressant medication Strict ER precautions discussed  The patient was given the opportunity to ask questions.  All questions answered to their satisfaction.  The  patient is in agreement to this plan.   Final Clinical Impressions(s) / UC Diagnoses   Final diagnoses:  Acute cough  Pneumonia of left lower lobe due to infectious organism     Discharge Instructions      You had pneumonia in your left lower lung.  Please take the azithromycin   and Augmentin  as prescribed to treat it.  You can take the Tessalon  Perles every 8 hours as needed for dry cough.  Also recommend drinking plenty of water and Mucinex 600 mg twice daily.  If symptoms do not improve significantly in the next 48 hours, please return here for recheck or follow-up with PCP.  Also recommend close follow-up with PCP within 6 weeks to ensure full resolution on x-ray imaging.     ED Prescriptions     Medication Sig Dispense Auth. Provider   azithromycin  (ZITHROMAX ) 250 MG tablet Take (2) tablets by mouth on day 1, then take (1) tablet by mouth on days 2-5. 6 tablet Chandra Raisin A, NP   amoxicillin -clavulanate (AUGMENTIN ) 875-125 MG tablet Take 1 tablet by mouth 2 (two) times daily for 7 days. 14 tablet Chandra Raisin A, NP   benzonatate  (TESSALON ) 100 MG capsule Take 1 capsule (100 mg total) by mouth 3 (three) times daily as needed for cough. Do not take with alcohol or while driving or operating heavy machinery.  May cause drowsiness. 21 capsule Chandra Raisin LABOR, NP      PDMP not reviewed this encounter.   Chandra Raisin LABOR, NP 09/29/23 [phone]

## 2023-09-29 NOTE — Discharge Instructions (Signed)
 You had pneumonia in your left lower lung.  Please take the azithromycin  and Augmentin  as prescribed to treat it.  You can take the Tessalon  Perles every 8 hours as needed for dry cough.  Also recommend drinking plenty of water and Mucinex 600 mg twice daily.  If symptoms do not improve significantly in the next 48 hours, please return here for recheck or follow-up with PCP.  Also recommend close follow-up with PCP within 6 weeks to ensure full resolution on x-ray imaging.

## 2023-09-29 NOTE — ED Triage Notes (Signed)
 Pt reports cough and congestion x 5 days, fatigue, malaise feeling.

## 2023-10-05 ENCOUNTER — Other Ambulatory Visit: Payer: BC Managed Care – PPO

## 2023-10-08 ENCOUNTER — Encounter: Payer: Self-pay | Admitting: Family Medicine

## 2023-10-12 ENCOUNTER — Ambulatory Visit: Payer: BC Managed Care – PPO | Admitting: Physician Assistant

## 2023-10-12 VITALS — BP 130/86 | HR 78 | Temp 97.7°F | Ht 71.0 in | Wt 204.5 lb

## 2023-10-12 DIAGNOSIS — R053 Chronic cough: Secondary | ICD-10-CM | POA: Diagnosis not present

## 2023-10-12 DIAGNOSIS — R052 Subacute cough: Secondary | ICD-10-CM

## 2023-10-12 MED ORDER — BENZONATATE 100 MG PO CAPS: 100.0000 mg | ORAL_CAPSULE | Freq: Three times a day (TID) | ORAL | 1 refills | Status: DC | PRN

## 2023-10-12 MED ORDER — METHYLPREDNISOLONE ACETATE 80 MG/ML IJ SUSP
80.0000 mg | Freq: Once | INTRAMUSCULAR | Status: AC
Start: 2023-10-12 — End: 2023-10-12
  Administered 2023-10-12: 80 mg via INTRAMUSCULAR

## 2023-10-12 MED ORDER — PREDNISONE 20 MG PO TABS: 20.0000 mg | ORAL_TABLET | Freq: Two times a day (BID) | ORAL | 0 refills | Status: DC

## 2023-10-12 MED ORDER — AZELASTINE HCL 0.1 % NA SOLN: 2.0000 | Freq: Two times a day (BID) | NASAL | 0 refills | Status: DC

## 2023-10-12 NOTE — Progress Notes (Signed)
Bradley [name] is a 58 y.o. male here for a follow up of a pre-existing problem.  History of Present Illness:   Chief Complaint  Patient presents with   Cough    Pt c/o cough x 1 month, expectorating white sputum, having coughing spells, worse at night.   Cough  Patient complains of a chronic cough that has persisted for the past month. He was recently diagnosed with pneumonia on 09/29/23 -- CXR confirmed LEFT LOWER LOBE PNA . No new symptoms is indicated at this time.  He completed his antibiotics course but continues to complain of a cough. He is also experiencing accompanying sputum production that is white in color and post nasal drainage.  His symptoms tend to worsen over night. Cough capsules tend to somewhat relief his cough. He does report compliance with omeprazole 20 mg daily but denies taking any allergy medications.  He denies any ear pain.  Past Medical History:  Diagnosis Date   Allergy    Blood transfusion without reported diagnosis    Cough variant asthma 10/12/2007   no per pt 06-19-16   Elevated LFTs    Esophageal reflux    on meds   GERD (gastroesophageal reflux disease)    Herniated nucleus pulposus, L3-4 left 06/20/2019   Hiatal hernia    Hyperlipidemia    Hypertension    on meds   Irritable bowel syndrome    LATERAL EPICONDYLITIS 03/07/2010   Qualifier: Diagnosis of  By: Caryl Never MD, Bruce  left shoulder arthrits per pt 06-19-16   Liver lesion    Lumbar radiculopathy 06/20/2019   Lung mass    Personal history of colonic polyps 09/28/2009    ADENOMATOUS POLYP   PONV (postoperative nausea and vomiting)    Schatzki's ring    Vitamin B 12 deficiency      Social History   Tobacco Use   Smoking status: Never   Smokeless tobacco: Never  Vaping Use   Vaping status: Never Used  Substance Use Topics   Alcohol use: Yes    Alcohol/week: 6.0 standard drinks of alcohol    Types: 6 Standard drinks or equivalent per week    Comment: 3-4 times per month per pt    Drug use: No    Past Surgical History:  Procedure Laterality Date   BACK SURGERY  07/12/2019   Dr.Stern   COLONOSCOPY  07/03/2016   COLONOSCOPY  2021   KN-MAC-suprep(exc)-TA-3 yr recall   Exploration Laparotomy and Small Bowel resection     INCISION AND DRAINAGE Right 03/05/2023   Procedure: RIGHT ELBOW INCISION AND DRAINAGE;  Surgeon: Tarry Kos, MD;  Location: [facility];  Service: Orthopedics;  Laterality: Right;   LEG SURGERY     left   LUMBAR LAMINECTOMY     x3 last one June 2017   microdisctomy  07/12/2019   L3-L4   POLYPECTOMY     SPINE SURGERY     UPPER GASTROINTESTINAL ENDOSCOPY  07/03/2016   VENTRAL HERNIA REPAIR     VIDEO BRONCHOSCOPY Bilateral 06/10/2016   Procedure: VIDEO BRONCHOSCOPY WITH FLUORO;  Surgeon: Chilton Greathouse, MD;  Location: [facility];  Service: Cardiopulmonary;  Laterality: Bilateral;   VIDEO BRONCHOSCOPY WITH ENDOBRONCHIAL ULTRASOUND N/A 09/08/2016   Procedure: VIDEO BRONCHOSCOPY WITH ENDOBRONCHIAL ULTRASOUND;  Surgeon: Chilton Greathouse, MD;  Location: [facility];  Service: Pulmonary;  Laterality: N/A;    Family History  Problem Relation Age of Onset   Breast cancer Mother    Thyroid cancer Mother  Healthy Father    Colon cancer Neg Hx    Esophageal cancer Neg Hx    Rectal cancer Neg Hx    Stomach cancer Neg Hx    Colon polyps Neg Hx     No Known Allergies  Current Medications:   Current Outpatient Medications:    amLODipine (NORVASC) 2.5 MG tablet, TAKE 1 TABLET BY MOUTH EVERY DAY, Disp: 90 tablet, Rfl: 1   atorvastatin (LIPITOR) 10 MG tablet, TAKE 1 TABLET BY MOUTH EVERY DAY, Disp: 90 tablet, Rfl: 3   clonazePAM (KLONOPIN) 0.5 MG tablet, Take 1 tablet (0.5 mg total) by mouth at bedtime as needed. for sleep, Disp: 30 tablet, Rfl: 5   cyanocobalamin (VITAMIN B12) 1000 MCG/ML injection, INJECT 1 ML (1,000 MCG TOTAL) INTO THE SKIN EVERY 30 (THIRTY) DAYS., Disp: 3 mL, Rfl: 1   omeprazole (PRILOSEC) 20 MG capsule, TAKE 1  CAPSULE BY MOUTH EVERY DAY, Disp: 90 capsule, Rfl: 3   Review of Systems:   Review of Systems  HENT:  Positive for congestion. Negative for ear pain.   Respiratory:  Positive for cough and sputum production.     Vitals:   Vitals:   10/12/23 1519  BP: 130/86  Pulse: 78  Temp: 97.7 F (36.5 C)  TempSrc: Temporal  SpO2: 99%  Weight: 204 lb 8 oz (92.8 kg)  Height: 5\' 11"  (1.803 m)     Body mass index is 28.52 kg/m.  Physical Exam:   Physical Exam Vitals and nursing note reviewed.  Constitutional:      General: He is not in acute distress.    Appearance: He is well-developed. He is not ill-appearing or toxic-appearing.  HENT:     Head: Normocephalic and atraumatic.     Right Ear: Tympanic membrane, ear canal and external ear normal. Tympanic membrane is not erythematous, retracted or bulging.     Left Ear: Tympanic membrane, ear canal and external ear normal. Tympanic membrane is not erythematous, retracted or bulging.     Nose: Nose normal.     Right Sinus: No maxillary sinus tenderness or frontal sinus tenderness.     Left Sinus: No maxillary sinus tenderness or frontal sinus tenderness.     Mouth/Throat:     Pharynx: Uvula midline. No posterior oropharyngeal erythema.  Eyes:     General: Lids are normal.     Conjunctiva/sclera: Conjunctivae normal.  Neck:     Trachea: Trachea normal.  Cardiovascular:     Rate and Rhythm: Normal rate and regular rhythm.     Heart sounds: Normal heart sounds, S1 normal and S2 normal.  Pulmonary:     Effort: Pulmonary effort is normal.     Breath sounds: Normal breath sounds. No decreased breath sounds, wheezing, rhonchi or rales.  Lymphadenopathy:     Cervical: No cervical adenopathy.  Skin:    General: Skin is warm and dry.  Neurological:     Mental Status: He is alert.  Psychiatric:        Speech: Speech normal.        Behavior: Behavior normal. Behavior is cooperative.     Assessment and Plan:   Subacute cough No red  flags on exam.   Advised as follows: -Depo-medrol shot today -Start prednisone tomorrow -Use cough capsules as needed -Start nasal spray to dry up post-nasal drip -Trial 12-hour delsym over the counter cough syrup (generic is fine!)  -Reach out if symptom(s) do not improve or if worsen Discussed taking medications as prescribed.  Reviewed  return precautions including new or worsening fever, SOB, new or worsening cough or other concerns.  Push fluids and rest.  I recommend that patient follow-up if symptoms worsen or persist despite treatment x 7-10 days, sooner if needed.    Jarold Motto, PA-C  I,Safa M Kadhim,acting as a scribe for Jarold Motto, PA.,have documented all relevant documentation on the behalf of Jarold Motto, PA,as directed by  Jarold Motto, PA while in the presence of Jarold Motto, [place].   I, Jarold Motto, [place], have reviewed all documentation for this visit. The documentation on 10/12/23 for the exam, diagnosis, procedures, and orders are all accurate and complete.

## 2023-10-12 NOTE — Patient Instructions (Signed)
It was great to see you!  Depo-medrol shot today  Start prednisone tomorrow  Use cough capsules as needed  Start nasal spray to dry up post-nasal drip  Trial 12-hour delsym over the counter cough syrup (generic is fine!)   Reach out if symptom(s) do not improve or if worsen  Take care,  Jarold Motto PA-C

## 2023-10-13 ENCOUNTER — Other Ambulatory Visit: Payer: Self-pay | Admitting: Family Medicine

## 2023-11-02 DIAGNOSIS — L821 Other seborrheic keratosis: Secondary | ICD-10-CM | POA: Diagnosis not present

## 2023-11-02 DIAGNOSIS — L578 Other skin changes due to chronic exposure to nonionizing radiation: Secondary | ICD-10-CM | POA: Diagnosis not present

## 2023-11-02 DIAGNOSIS — D485 Neoplasm of uncertain behavior of skin: Secondary | ICD-10-CM | POA: Diagnosis not present

## 2023-11-02 DIAGNOSIS — L814 Other melanin hyperpigmentation: Secondary | ICD-10-CM | POA: Diagnosis not present

## 2023-11-02 DIAGNOSIS — D1801 Hemangioma of skin and subcutaneous tissue: Secondary | ICD-10-CM | POA: Diagnosis not present

## 2023-11-03 ENCOUNTER — Other Ambulatory Visit: Payer: Self-pay | Admitting: Physician Assistant

## 2023-11-13 ENCOUNTER — Other Ambulatory Visit: Payer: Self-pay

## 2023-11-13 ENCOUNTER — Encounter: Payer: Self-pay | Admitting: Family Medicine

## 2023-11-13 DIAGNOSIS — J189 Pneumonia, unspecified organism: Secondary | ICD-10-CM

## 2024-01-28 ENCOUNTER — Ambulatory Visit

## 2024-01-28 ENCOUNTER — Ambulatory Visit: Admitting: Family Medicine

## 2024-01-28 ENCOUNTER — Encounter: Payer: Self-pay | Admitting: Family Medicine

## 2024-01-28 VITALS — BP 130/80 | HR 80 | Temp 97.2°F | Ht 71.0 in | Wt 198.2 lb

## 2024-01-28 DIAGNOSIS — I10 Essential (primary) hypertension: Secondary | ICD-10-CM | POA: Diagnosis not present

## 2024-01-28 DIAGNOSIS — M25511 Pain in right shoulder: Secondary | ICD-10-CM | POA: Diagnosis not present

## 2024-01-28 DIAGNOSIS — M25512 Pain in left shoulder: Secondary | ICD-10-CM

## 2024-01-28 DIAGNOSIS — M25551 Pain in right hip: Secondary | ICD-10-CM | POA: Diagnosis not present

## 2024-01-28 DIAGNOSIS — M7712 Lateral epicondylitis, left elbow: Secondary | ICD-10-CM | POA: Diagnosis not present

## 2024-01-28 DIAGNOSIS — J189 Pneumonia, unspecified organism: Secondary | ICD-10-CM | POA: Diagnosis not present

## 2024-01-28 DIAGNOSIS — R972 Elevated prostate specific antigen [PSA]: Secondary | ICD-10-CM | POA: Diagnosis not present

## 2024-01-28 DIAGNOSIS — M160 Bilateral primary osteoarthritis of hip: Secondary | ICD-10-CM | POA: Diagnosis not present

## 2024-01-28 MED ORDER — PREDNISONE 20 MG PO TABS
ORAL_TABLET | ORAL | 0 refills | Status: DC
Start: 2024-01-28 — End: 2024-05-09

## 2024-01-28 NOTE — Patient Instructions (Addendum)
 X-ray before you leave- ordered previously chest plus lets do the right hip  Lets try prednisone - possible bursitis in right shoulder as well as right hip but I'm also concerned about arthritis in hip potentially- we may refer to Dr. Christiane Cowing depending on results when we get x-ray back (may be a ewek or two)  - also may trial physical therapy depending on how you do with prednisone - you wanted to hold off for now   Recommended follow up: Return for next already scheduled visit or sooner if needed.

## 2024-01-28 NOTE — Progress Notes (Signed)
 Phone [phone] In person visit   Subjective:   Bradley [name] is a 58 y.o. year old very pleasant male patient who presents for/with See problem oriented charting Chief Complaint  Patient presents with   Arthritis    Non fasting. Right hip, Right shoulder. Started in January. States ibuprofen kind of helps sometimes. Sometimes left shoulder and hands, but mainly on right.     Past Medical History-  Patient Active Problem List   Diagnosis Date Noted   Herniated nucleus pulposus, L3-4 left 06/20/2019    Priority: High   History of small bowel obstruction 04/11/2016    Priority: High   Aortic atherosclerosis (HCC) 08/25/2022    Priority: Medium    Pulmonary nodules 06/03/2016    Priority: Medium    Insomnia 05/10/2015    Priority: Medium    Essential hypertension 05/10/2015    Priority: Medium    Hyperlipidemia 05/12/2007    Priority: Medium    GERD 05/12/2007    Priority: Medium    Hemangioma of liver 11/24/2017    Priority: Low   Gallstones 05/10/2015    Priority: Low   History of colonic polyps 10/03/2010    Priority: Low   B12 deficiency 09/05/2009    Priority: Low   IBS 08/31/2009    Priority: Low   ALLERGIC RHINITIS, SEASONAL 07/24/2009    Priority: Low   External hemorrhoids 06/07/2008    Priority: Low   Septic olecranon bursitis, right 03/05/2023    Medications- reviewed and updated Current Outpatient Medications  Medication Sig Dispense Refill   amLODipine  (NORVASC ) 2.5 MG tablet TAKE 1 TABLET BY MOUTH EVERY DAY 90 tablet 1   atorvastatin  (LIPITOR) 10 MG tablet TAKE 1 TABLET BY MOUTH EVERY DAY 90 tablet 3   clonazePAM  (KLONOPIN ) 0.5 MG tablet Take 1 tablet (0.5 mg total) by mouth at bedtime as needed. for sleep 30 tablet 5   cyanocobalamin  (VITAMIN B12) 1000 MCG/ML injection INJECT 1 ML (1,000 MCG TOTAL) INTO THE SKIN EVERY 30 (THIRTY) DAYS. 3 mL 1   omeprazole  (PRILOSEC) 20 MG capsule TAKE 1 CAPSULE BY MOUTH EVERY DAY 90 capsule 3   predniSONE   (DELTASONE ) 20 MG tablet Take 2 pills for 3 days, 1 pill for 4 days 10 tablet 0   No current facility-administered medications for this visit.     Objective:  BP 130/80   Pulse 80   Temp (!) 97.2 F (36.2 C) (Temporal)   Ht 5\' 11"  (1.803 m)   Wt 198 lb 3.2 oz (89.9 kg)   SpO2 98%   BMI 27.64 kg/m  Gen: NAD, resting comfortably CV: RRR no murmurs rubs or gallops Lungs: CTAB no crackles, wheeze, rhonchi Ext: no edema Skin: warm, dry Neuro: Good strength in upper and lower extremities MSK: Good range of motion in the hip.  During Gallaway test he grabs at his right lateral hip-also similar with external and internal rotation of the hip but no groin pain reported.  Mild pain with palpation over greater trochanter  Patient with good range of motion of the shoulders but positive signs for impingement bilaterally-on the right the empty can test is the most bothersome maneuver and greater than the left but on the left Neer and Hawkin test show greater pain.  Has some pain along the posterior aspect of right shoulder with palpation     Assessment and Plan    # multiple joint pains S: Patient reports starting in January (no fall or injury) he noted right hip  and right shoulder pain and hand pain- along with left elbow.  Ibuprofen will give some relief at times.  Sometimes the left shoulder (known arthritis he reports)  and hands hurt as well but the right is certainly the worst area. If does anything active may ache for several days.  - lifted something out of truck last week and right shoulder flared up in particular- heavy lifting is the biggest trigger. Pain goes all day. Pain is not worse in morning. No morning stiffness. History of back issues- ongoing pain- but not worse lately  Right lateral hip. (Wakes hip up if sleeps on it). Hip more constant Right>left shoulder (reports known arthritis left). Shoulders will get better until another trigger Right hand- improving Left hip  minimal Left tennis elbow  Sleeps best in recliner  A/P: Patient's biggest concern is his right hip pain-location wise this concerns me for greater trochanteric bursitis but with exam maneuvers to provoke arthritic pain he consistently grabs that his lateral hip-I am going to get x-rays to evaluate for arthritis  He reports known arthritis in the left shoulder but with intermittent flares in both shoulders.  I wonder if he could have some arthritis in the right shoulder as well versus subacromial bursitis  Right hand with some mild pain with palpation of MCP and 4th and 5th finger.  Suspect arthritis.  Also has some pain with resisted pronation at left elbow concerning for tennis elbow-discussed could try counterforce brace or sleeve  For all of these issues we opted to trial a course of prednisone  given multiple areas affected and has already tried NSAIDs.  He will monitor his blood pressure on this and is aware of side effects of prednisone  -Also discussed physical therapy but he wants to hold off for now -We did not decide to image anything outside of the hip which is the most prominent issue -Patient is seeing Dr. Christiane Cowing in the past and would be open to referral back to him -No morning stiffness noted-doubt rheumatological disease including something like polymyalgia rheumatica  #hypertension S: medication: Amlodipine  2.5 mg -usually at home 120-130/70-85 BP Readings from Last 3 Encounters:  01/28/24 130/80  10/12/23 130/86  09/29/23 139/87  A/P: stable- continue current medicines.  I think he can tolerate prednisone   # Also needs repeat chest x-ray from last pneumonia completed.  Also noted after visit due for repeat PSA-encouraged him to complete this as well    Recommended follow up: Return for next already scheduled visit or sooner if needed. Future Appointments  Date Time Provider Department Center  08/30/2024  8:00 AM Almira Jaeger, MD [facility]    Lab/Order  associations:   ICD-10-CM   1. Right hip pain  M25.551 DG HIP UNILAT W OR W/O PELVIS 2-3 VIEWS RIGHT    2. Acute pain of both shoulders  M25.511    M25.512     3. Left tennis elbow  M77.12     4. Essential hypertension  I10       Meds ordered this encounter  Medications   predniSONE  (DELTASONE ) 20 MG tablet    Sig: Take 2 pills for 3 days, 1 pill for 4 days    Dispense:  10 tablet    Refill:  0    Time Spent: 35 minutes of total time (2:33 PM- 3:08 PM) was spent on the date of the encounter performing the following actions: chart review prior to seeing the patient, obtaining history, performing a medically necessary exam, counseling  on the workup and treatment plan, placing orders, and documenting in our EHR.   Return precautions advised.  Clarisa Crooked, MD

## 2024-01-29 ENCOUNTER — Encounter: Payer: Self-pay | Admitting: Family Medicine

## 2024-01-29 LAB — PSA: PSA: 3.23 ng/mL (ref 0.10–4.00)

## 2024-02-01 ENCOUNTER — Other Ambulatory Visit: Payer: Self-pay

## 2024-02-01 DIAGNOSIS — R972 Elevated prostate specific antigen [PSA]: Secondary | ICD-10-CM

## 2024-02-02 NOTE — Telephone Encounter (Signed)
 See below. Question regarding urology has been answered.

## 2024-02-04 ENCOUNTER — Ambulatory Visit: Payer: Self-pay | Admitting: Family Medicine

## 2024-02-05 ENCOUNTER — Other Ambulatory Visit: Payer: Self-pay

## 2024-02-05 DIAGNOSIS — R972 Elevated prostate specific antigen [PSA]: Secondary | ICD-10-CM | POA: Diagnosis not present

## 2024-02-05 DIAGNOSIS — N400 Enlarged prostate without lower urinary tract symptoms: Secondary | ICD-10-CM | POA: Diagnosis not present

## 2024-02-05 MED ORDER — GABAPENTIN 300 MG PO CAPS: ORAL_CAPSULE | ORAL | 3 refills | Status: DC

## 2024-02-09 ENCOUNTER — Other Ambulatory Visit: Payer: Self-pay | Admitting: Urology

## 2024-02-09 DIAGNOSIS — R972 Elevated prostate specific antigen [PSA]: Secondary | ICD-10-CM

## 2024-02-11 ENCOUNTER — Encounter: Payer: Self-pay | Admitting: Urology

## 2024-02-12 ENCOUNTER — Other Ambulatory Visit: Payer: Self-pay | Admitting: Urology

## 2024-02-12 DIAGNOSIS — R972 Elevated prostate specific antigen [PSA]: Secondary | ICD-10-CM

## 2024-03-21 ENCOUNTER — Inpatient Hospital Stay
Admission: RE | Admit: 2024-03-21 | Discharge: 2024-03-21 | Disposition: A | Discharge status: Home / Self Care | Source: Ambulatory Visit | Attending: Urology

## 2024-03-21 ENCOUNTER — Ambulatory Visit
Admission: RE | Admit: 2024-03-21 | Discharge: 2024-03-21 | Disposition: A | Discharge status: Home / Self Care | Source: Ambulatory Visit | Attending: Urology

## 2024-03-21 DIAGNOSIS — R972 Elevated prostate specific antigen [PSA]: Secondary | ICD-10-CM

## 2024-03-21 DIAGNOSIS — N4289 Other specified disorders of prostate: Secondary | ICD-10-CM | POA: Diagnosis not present

## 2024-03-21 MED ORDER — GADOPICLENOL 0.5 MMOL/ML IV SOLN
9.0000 mL | Freq: Once | INTRAVENOUS | Status: AC | PRN
  Administered 2024-03-21: 9 mL via INTRAVENOUS

## 2024-03-22 ENCOUNTER — Other Ambulatory Visit: Payer: Self-pay | Admitting: Family Medicine

## 2024-04-15 ENCOUNTER — Other Ambulatory Visit: Payer: Self-pay | Admitting: Family Medicine

## 2024-05-05 ENCOUNTER — Encounter: Payer: Self-pay | Admitting: Family Medicine

## 2024-05-09 ENCOUNTER — Ambulatory Visit: Admitting: Family Medicine

## 2024-05-09 VITALS — BP 148/90 | HR 76 | Temp 98.2°F | Resp 18 | Ht 71.0 in | Wt 204.5 lb

## 2024-05-09 DIAGNOSIS — I10 Essential (primary) hypertension: Secondary | ICD-10-CM | POA: Diagnosis not present

## 2024-05-09 DIAGNOSIS — M7021 Olecranon bursitis, right elbow: Secondary | ICD-10-CM

## 2024-05-09 NOTE — Progress Notes (Signed)
 Subjective:     Patient ID: Bradley [name], male    DOB: Apr 24, 1966, 58 y.o.   MRN: [id number]  Chief Complaint  Patient presents with   Elbow Injury    Right elbow swelling and pain that started about 1 month ago, getting worse     HPI Discussed the use of AI scribe software for clinical note transcription with the patient, who gave verbal consent to proceed.  History of Present Illness Bradley [name] is a 58 year old male who presents with a painful and swollen right elbow.  He has a painful and swollen right elbow, described as a 'little sack'. The pain is not as severe as last year and is currently minimal. No fever is present, and the elbow no longer feels hot, unlike during a previous infection. He has been taking ibuprofen for the pain and swelling. There is no history of gout or recent injury to the elbow.  Last year, he had an infection in the elbow that required surgical intervention. At that time, even a light touch, such as a towel or wind, would cause significant pain. The current symptoms have been present for about a month without significant improvement or worsening.  He has a history of hypertension, with blood pressures typically around 130/73 mmHg at home. He is currently taking amlodipine  2.5 mg for blood pressure management.    Health Maintenance Due  Topic Date Due   Hepatitis B Vaccines 19-59 Average Risk (1 of 3 - 19+ 3-dose series) Never done   INFLUENZA VACCINE  04/22/2024    Past Medical History:  Diagnosis Date   Allergy    Blood transfusion without reported diagnosis    Cough variant asthma 10/12/2007   no per pt 06-19-16   Elevated LFTs    Esophageal reflux    on meds   GERD (gastroesophageal reflux disease)    Herniated nucleus pulposus, L3-4 left 06/20/2019   Hiatal hernia    Hyperlipidemia    Hypertension    on meds   Irritable bowel syndrome    LATERAL EPICONDYLITIS 03/07/2010   Qualifier: Diagnosis of  By: Micheal MD, Bruce   left shoulder arthrits per pt 06-19-16   Liver lesion    Lumbar radiculopathy 06/20/2019   Lung mass    Personal history of colonic polyps 09/28/2009    ADENOMATOUS POLYP   PONV (postoperative nausea and vomiting)    Schatzki's ring    Vitamin B 12 deficiency     Past Surgical History:  Procedure Laterality Date   BACK SURGERY  07/12/2019   Dr.Stern   COLON SURGERY     COLONOSCOPY  07/03/2016   COLONOSCOPY  2021   KN-MAC-suprep(exc)-TA-3 yr recall   Exploration Laparotomy and Small Bowel resection     HERNIA REPAIR     INCISION AND DRAINAGE Right 03/05/2023   Procedure: RIGHT ELBOW INCISION AND DRAINAGE;  Surgeon: Jerri Kay HERO, MD;  Location: [facility];  Service: Orthopedics;  Laterality: Right;   LEG SURGERY     left   LUMBAR LAMINECTOMY     x3 last one June 2017   microdisctomy  07/12/2019   L3-L4   POLYPECTOMY     SPINE SURGERY     UPPER GASTROINTESTINAL ENDOSCOPY  07/03/2016   VENTRAL HERNIA REPAIR     VIDEO BRONCHOSCOPY Bilateral 06/10/2016   Procedure: VIDEO BRONCHOSCOPY WITH FLUORO;  Surgeon: Lonna Coder, MD;  Location: [facility];  Service: Cardiopulmonary;  Laterality: Bilateral;  VIDEO BRONCHOSCOPY WITH ENDOBRONCHIAL ULTRASOUND N/A 09/08/2016   Procedure: VIDEO BRONCHOSCOPY WITH ENDOBRONCHIAL ULTRASOUND;  Surgeon: Lonna Coder, MD;  Location: [facility];  Service: Pulmonary;  Laterality: N/A;     Current Outpatient Medications:    amLODipine  (NORVASC ) 2.5 MG tablet, TAKE 1 TABLET BY MOUTH EVERY DAY, Disp: 90 tablet, Rfl: 1   atorvastatin  (LIPITOR) 10 MG tablet, TAKE 1 TABLET BY MOUTH EVERY DAY, Disp: 90 tablet, Rfl: 3   clonazePAM  (KLONOPIN ) 0.5 MG tablet, Take 1 tablet (0.5 mg total) by mouth at bedtime as needed. for sleep, Disp: 30 tablet, Rfl: 5   cyanocobalamin  (VITAMIN B12) 1000 MCG/ML injection, INJECT 1 ML (1,000 MCG TOTAL) INTO THE SKIN EVERY 30 (THIRTY) DAYS., Disp: 3 mL, Rfl: 1   omeprazole  (PRILOSEC) 20 MG capsule, TAKE 1 CAPSULE  BY MOUTH EVERY DAY, Disp: 90 capsule, Rfl: 3   levofloxacin  (LEVAQUIN ) 750 MG tablet, Take 750 mg by mouth daily. (Patient not taking: Reported on 05/09/2024), Disp: , Rfl:   No Known Allergies ROS neg/noncontributory except as noted HPI/below      Objective:     BP (!) 148/90 (BP Location: Left Arm, Patient Position: Sitting, Cuff Size: Normal)   Pulse 76   Temp 98.2 F (36.8 C) (Temporal)   Resp 18   Ht 5' 11 (1.803 m)   Wt 204 lb 8 oz (92.8 kg)   SpO2 97%   BMI 28.52 kg/m  Wt Readings from Last 3 Encounters:  05/09/24 204 lb 8 oz (92.8 kg)  01/28/24 198 lb 3.2 oz (89.9 kg)  10/12/23 204 lb 8 oz (92.8 kg)    Physical Exam   Gen: WDWN NAD HEENT: NCAT, conjunctiva not injected, sclera nonicteric CARDIAC: RRR, S1S2+, no murmur.  EXT:  no edema MSK: R elbow bursa sl enlarged.  Not red nor warm.  Not tender.   NEURO: A&O x3.  CN II-XII intact.  PSYCH: normal mood. Good eye contact     Assessment & Plan:  Olecranon bursitis of right elbow  Essential hypertension  Assessment and Plan Assessment & Plan Right olecranon bursitis   Chronic right olecranon bursitis presents with swelling and mild pain, without signs of infection such as fever or increased warmth. Symptoms are less severe than a previous infection in the same area. Differential diagnosis includes scar tissue and aggravated bursa due to pressure. No evidence of acute infection. He prefers to avoid needle aspiration and antibiotics. Continue ibuprofen for pain management and consider Voltaren  gel for local anti-inflammatory effect. Avoid needle aspiration or antibiotics. Schedule a follow-up with Dr. Jerri to assess the need for further intervention. Consider compression wrapping if desired. Declines labs for now.  Monitor closely.  Any changes, needs to be eval again.   Essential hypertension   Hypertension with recent readings of 135/76 and 148/90, managed with amlodipine  2.5 mg. Blood pressure typically runs ok at  home, around 130/73. Continue amlodipine  2.5 mg daily and monitor blood pressure regularly.    Return if symptoms worsen or fail to improve.  Bradley CHRISTELLA Carrel, MD

## 2024-05-09 NOTE — Patient Instructions (Signed)
 Voltaren  gel  Ibuprofen or aleve  Worse, let us  know.  See Dr. Jerri

## 2024-05-13 DIAGNOSIS — R972 Elevated prostate specific antigen [PSA]: Secondary | ICD-10-CM | POA: Diagnosis not present

## 2024-05-14 ENCOUNTER — Other Ambulatory Visit: Payer: Self-pay | Admitting: Family Medicine

## 2024-05-20 DIAGNOSIS — R972 Elevated prostate specific antigen [PSA]: Secondary | ICD-10-CM | POA: Diagnosis not present

## 2024-05-20 DIAGNOSIS — N400 Enlarged prostate without lower urinary tract symptoms: Secondary | ICD-10-CM | POA: Diagnosis not present

## 2024-05-24 ENCOUNTER — Ambulatory Visit (INDEPENDENT_AMBULATORY_CARE_PROVIDER_SITE_OTHER): Admitting: Orthopaedic Surgery

## 2024-05-24 DIAGNOSIS — M71121 Other infective bursitis, right elbow: Secondary | ICD-10-CM | POA: Diagnosis not present

## 2024-05-24 NOTE — Progress Notes (Signed)
 Patient ID: Bradley [name], male   DOB: 1966-05-30, 58 y.o.   MRN: [id number]  Bradley [name] is somebody I know from last year for an I&D of right olecranon septic bursitis.  He states that in the last couple weeks it swelled up again and got red and scabbed over but as part of another procedure he got a penicillin injection and a course of antibiotics which resolved the elbow issue.  He denies any constitutional symptoms.  Examination of the right elbow is benign.  He has painless range of motion.  No signs of infection.  From my standpoint I think he had a septic bursitis that was treated by the antibiotics.  For now he does not need any additional intervention.  He is to keep a close eye on it and if things get worse he is to come back to see me ASAP.

## 2024-07-25 ENCOUNTER — Encounter: Payer: Self-pay | Admitting: Radiology

## 2024-08-30 ENCOUNTER — Encounter: Payer: Self-pay | Admitting: Family Medicine

## 2024-08-30 ENCOUNTER — Ambulatory Visit: Payer: BC Managed Care – PPO | Admitting: Family Medicine

## 2024-08-30 ENCOUNTER — Ambulatory Visit: Payer: Self-pay | Admitting: Family Medicine

## 2024-08-30 VITALS — BP 142/80 | HR 78 | Temp 97.8°F | Ht 71.0 in | Wt 200.2 lb

## 2024-08-30 DIAGNOSIS — Z0000 Encounter for general adult medical examination without abnormal findings: Secondary | ICD-10-CM | POA: Diagnosis not present

## 2024-08-30 DIAGNOSIS — E663 Overweight: Secondary | ICD-10-CM

## 2024-08-30 DIAGNOSIS — Z131 Encounter for screening for diabetes mellitus: Secondary | ICD-10-CM

## 2024-08-30 DIAGNOSIS — E538 Deficiency of other specified B group vitamins: Secondary | ICD-10-CM | POA: Diagnosis not present

## 2024-08-30 DIAGNOSIS — E785 Hyperlipidemia, unspecified: Secondary | ICD-10-CM | POA: Diagnosis not present

## 2024-08-30 DIAGNOSIS — I10 Essential (primary) hypertension: Secondary | ICD-10-CM | POA: Diagnosis not present

## 2024-08-30 DIAGNOSIS — Z125 Encounter for screening for malignant neoplasm of prostate: Secondary | ICD-10-CM | POA: Diagnosis not present

## 2024-08-30 LAB — LIPID PANEL
Cholesterol: 184 mg/dL (ref 0–200)
HDL: 50.2 mg/dL (ref >39.00)
LDL Cholesterol: 110 mg/dL — ABNORMAL HIGH (ref 0–99)
NonHDL: 133.93
Total CHOL/HDL Ratio: 4
Triglycerides: 122 mg/dL (ref 0.0–149.0)
VLDL: 24.4 mg/dL (ref 0.0–40.0)

## 2024-08-30 LAB — COMPREHENSIVE METABOLIC PANEL WITH GFR
ALT: 28 U/L (ref 0–53)
AST: 20 U/L (ref 0–37)
Albumin: 4.8 g/dL (ref 3.5–5.2)
Alkaline Phosphatase: 77 U/L (ref 39–117)
BUN: 15 mg/dL (ref 6–23)
CO2: 28 meq/L (ref 19–32)
Calcium: 9.5 mg/dL (ref 8.4–10.5)
Chloride: 102 meq/L (ref 96–112)
Creatinine, Ser: 0.85 mg/dL (ref 0.40–1.50)
GFR: 94.76 mL/min (ref >60.00)
Glucose, Bld: 87 mg/dL (ref 70–99)
Potassium: 3.9 meq/L (ref 3.5–5.1)
Sodium: 141 meq/L (ref 135–145)
Total Bilirubin: 1.2 mg/dL (ref 0.2–1.2)
Total Protein: 6.6 g/dL (ref 6.0–8.3)

## 2024-08-30 LAB — VITAMIN B12: Vitamin B-12: 302 pg/mL (ref 211–911)

## 2024-08-30 LAB — CBC WITH DIFFERENTIAL/PLATELET
Basophils Absolute: 0 K/uL (ref 0.0–0.1)
Basophils Relative: 0.4 % (ref 0.0–3.0)
Eosinophils Absolute: 0 K/uL (ref 0.0–0.7)
Eosinophils Relative: 0.7 % (ref 0.0–5.0)
HCT: 43.9 % (ref 39.0–52.0)
Hemoglobin: 15.2 g/dL (ref 13.0–17.0)
Lymphocytes Relative: 17.2 % (ref 12.0–46.0)
Lymphs Abs: 1 K/uL (ref 0.7–4.0)
MCHC: 34.6 g/dL (ref 30.0–36.0)
MCV: 90.6 fl (ref 78.0–100.0)
Monocytes Absolute: 0.5 K/uL (ref 0.1–1.0)
Monocytes Relative: 8 % (ref 3.0–12.0)
Neutro Abs: 4.2 K/uL (ref 1.4–7.7)
Neutrophils Relative %: 73.7 % (ref 43.0–77.0)
Platelets: 211 K/uL (ref 150.0–400.0)
RBC: 4.85 Mil/uL (ref 4.22–5.81)
RDW: 13.1 % (ref 11.5–15.5)
WBC: 5.6 K/uL (ref 4.0–10.5)

## 2024-08-30 LAB — PSA: PSA: 2.89 ng/mL (ref 0.10–4.00)

## 2024-08-30 LAB — HEMOGLOBIN A1C: Hgb A1c MFr Bld: 5.3 % (ref 4.6–6.5)

## 2024-08-30 NOTE — Patient Instructions (Addendum)
 blood pressure slightly high today and last visit- asked him to do some home monitoring over next 1 week and update me with daily readings- if still high at home consider 5 mg dose   Please stop by lab before you go If you have mychart- we will send your results within 3 business days of us  receiving them.  If you do not have mychart- we will call you about results within 5 business days of us  receiving them.  *please also note that you will see labs on mychart as soon as they post. I will later go in and write notes on them- will say notes from Dr. Katrinka   Recommended follow up: Return in about 6 months (around 02/28/2025) for followup or sooner if needed.Schedule b4 you leave. Or at latest year for physical if blood pressure looks good at home

## 2024-08-30 NOTE — Progress Notes (Signed)
 Phone: [phone]   Subjective:  Patient presents today for their annual physical. Chief complaint-noted.   See problem oriented charting- ROS- full  review of systems was completed and negative  except for topics noted under acute/chronic concerns  The following were reviewed and entered/updated in epic: Past Medical History:  Diagnosis Date   Allergy    Blood transfusion without reported diagnosis    Cough variant asthma 10/12/2007   no per pt 06-19-16   Elevated LFTs    Esophageal reflux    on meds   GERD (gastroesophageal reflux disease)    Herniated nucleus pulposus, L3-4 left 06/20/2019   Hiatal hernia    Hyperlipidemia    Hypertension    on meds   Irritable bowel syndrome    LATERAL EPICONDYLITIS 03/07/2010   Qualifier: Diagnosis of  By: Micheal MD, Bruce  left shoulder arthrits per pt 06-19-16   Liver lesion    Lumbar radiculopathy 06/20/2019   Lung mass    Personal history of colonic polyps 09/28/2009    ADENOMATOUS POLYP   PONV (postoperative nausea and vomiting)    Schatzki's ring    Vitamin B 12 deficiency    Patient Active Problem List   Diagnosis Date Noted   Herniated nucleus pulposus, L3-4 left 06/20/2019    Priority: High   History of small bowel obstruction 04/11/2016    Priority: High   Aortic atherosclerosis 08/25/2022    Priority: Medium    Pulmonary nodules 06/03/2016    Priority: Medium    Insomnia 05/10/2015    Priority: Medium    Essential hypertension 05/10/2015    Priority: Medium    Hyperlipidemia 05/12/2007    Priority: Medium    GERD 05/12/2007    Priority: Medium    Hemangioma of liver 11/24/2017    Priority: Low   Gallstones 05/10/2015    Priority: Low   History of colonic polyps 10/03/2010    Priority: Low   B12 deficiency 09/05/2009    Priority: Low   IBS 08/31/2009    Priority: Low   ALLERGIC RHINITIS, SEASONAL 07/24/2009    Priority: Low   External hemorrhoids 06/07/2008    Priority: Low   Septic olecranon  bursitis, right 03/05/2023   Past Surgical History:  Procedure Laterality Date   BACK SURGERY  07/12/2019   Dr.Stern   COLON SURGERY     COLONOSCOPY  07/03/2016   COLONOSCOPY  2021   KN-MAC-suprep(exc)-TA-3 yr recall   Exploration Laparotomy and Small Bowel resection     HERNIA REPAIR     INCISION AND DRAINAGE Right 03/05/2023   Procedure: RIGHT ELBOW INCISION AND DRAINAGE;  Surgeon: Jerri Kay HERO, MD;  Location: [facility];  Service: Orthopedics;  Laterality: Right;   LEG SURGERY     left   LUMBAR LAMINECTOMY     x3 last one June 2017   microdisctomy  07/12/2019   L3-L4   POLYPECTOMY     SPINE SURGERY     UPPER GASTROINTESTINAL ENDOSCOPY  07/03/2016   VENTRAL HERNIA REPAIR     VIDEO BRONCHOSCOPY Bilateral 06/10/2016   Procedure: VIDEO BRONCHOSCOPY WITH FLUORO;  Surgeon: Lonna Coder, MD;  Location: [facility];  Service: Cardiopulmonary;  Laterality: Bilateral;   VIDEO BRONCHOSCOPY WITH ENDOBRONCHIAL ULTRASOUND N/A 09/08/2016   Procedure: VIDEO BRONCHOSCOPY WITH ENDOBRONCHIAL ULTRASOUND;  Surgeon: Lonna Coder, MD;  Location: [facility];  Service: Pulmonary;  Laterality: N/A;    Family History  Problem Relation Age of Onset   Breast cancer Mother  Thyroid cancer Mother    Healthy Father    Colon cancer Neg Hx    Esophageal cancer Neg Hx    Rectal cancer Neg Hx    Stomach cancer Neg Hx    Colon polyps Neg Hx     Medications- reviewed and updated Current Outpatient Medications  Medication Sig Dispense Refill   amLODipine  (NORVASC ) 2.5 MG tablet TAKE 1 TABLET BY MOUTH EVERY DAY 90 tablet 1   atorvastatin  (LIPITOR) 10 MG tablet TAKE 1 TABLET BY MOUTH EVERY DAY 90 tablet 3   cyanocobalamin  (VITAMIN B12) 1000 MCG/ML injection INJECT 1 ML (1,000 MCG TOTAL) INTO THE SKIN EVERY 30 (THIRTY) DAYS. 3 mL 1   omeprazole  (PRILOSEC) 20 MG capsule TAKE 1 CAPSULE BY MOUTH EVERY DAY 90 capsule 3   No current facility-administered medications for this visit.     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 Oropharynx normal Neck: no thyromegaly CV: RRR no murmurs rubs or gallops Lungs: CTAB no crackles, wheeze, rhonchi Abdomen: soft/nontender/nondistended/normal bowel sounds. No rebound or guarding.  Ext: no edema Skin: warm, dry Neuro: grossly normal, moves all extremities, PERRLA   Assessment and Plan  58 y.o. male presenting for annual physical.  Health Maintenance counseling: 1. Anticipatory guidance: Patient counseled regarding regular dental exams -q6 months, eye exams - yearly,  avoiding smoking and second hand smoke , limiting alcohol to 2 beverages per day - 6 a week or less, no illicit drugs .   2. Risk factor reduction:  Advised patient of need for regular exercise and diet rich and fruits and vegetables to reduce risk of heart attack and stroke.  Exercise-15,000 steps per day at work still.  Diet/weight management-weight stable from last year- reports got even lower week before thanksgiving but went to [place] and then had bday an had some weight gain.  Wt Readings from Last 3 Encounters:  08/30/24 200 lb 3.2 oz (90.8 kg)  05/09/24 204 lb 8 oz (92.8 kg)  01/28/24 198 lb 3.2 oz (89.9 kg)  3. Immunizations/screenings/ancillary studies-up-to-date.  Holding  off on COVID and Prevnar 20 Immunization History  Administered Date(s) Administered   Influenza Inj Mdck Quad With Preservative 07/29/2019   Influenza Split 07/14/2011   Influenza, Mdck, Trivalent,PF 6+ MOS(egg free) 07/25/2024   Influenza,  Seasonal, Injecte, Preservative Fre 07/05/2023   Influenza,inj,Quad PF,6+ Mos 06/27/2013, 07/30/2015, 06/17/2016, 08/19/2017, 06/24/2018, 08/20/2020, 08/25/2022   PFIZER(Purple Top)SARS-COV-2 Vaccination 04/26/2020, 05/17/2020   Td 06/23/1999, 08/25/2008   Tdap 06/21/2016   Zoster Recombinant(Shingrix) 07/11/2022, 10/18/2022  4. Prostate cancer screening- referred to urology in 2025 with increasing PSA but ultimately had reassuring biopsy with Dr. Carolee. PSA today hed prefer not to return of the urology unless absolutely necessary Lab Results  Component Value Date   PSA 3.23 01/28/2024   PSA 2.15 08/28/2023   PSA 1.53 08/25/2022   5. Colon cancer screening - April 2024 colonoscopy with 3-year repeat with Dr. Nandigam 6. Skin cancer screening-sees dermatology specialists yearly typically.  advised regular sunscreen use. Denies worrisome, changing, or new skin lesions.  7. Smoking associated screening (lung cancer screening, AAA screen 65-75, UA)-never smoker 8. STD screening -only active with wife  Status of chronic or acute concerns   #Low back pain-history of neurosurgery Dr. Unice July 12, 2019 with microdiscectomy L3-L4.  Patient on gabapentin  through neurosurgery in past- now off. Still numbness in left leg  #hypertension S: medication: Amlodipine  2.5 mg Home readings #s: no recent checks but has cuff BP Readings from Last 3 Encounters:  08/30/24 (!) 142/80  05/09/24 (!) 148/90  01/28/24 130/80  A/P: blood pressure slightly high today and last visit- asked him to do some home monitoring over next 1 week and update me with daily readings- if still high at home consider 5 mg dose   #hyperlipidemia- peak LDL 191 #aortic atherosclerosis  S: Medication:Atorvastatin  10 mg--> 20 mg in December 2024 planned but my team did not send in as intended Lab Results  Component Value Date   CHOL 200 08/28/2023   HDL 40.30 08/28/2023   LDLCALC 131 (H) 08/28/2023   LDLDIRECT 145.0 07/22/2016    TRIG 147.0 08/28/2023   CHOLHDL 5 08/28/2023  A/P: would prefer LDL closer to 100- update lipids and if not at goal may go up to 20 mg dose   # GERD S:Medication: On Dexilant   60 mg with worsening in 2023 but was able to go back down to omeprazole  20 mg daily- hsant needed twice daily lately A/P: reasonable control- continue current medications     #B12 deficiency S: Patient receives monthly B12 injections at home.  Nephew is an Magazine Features Editor Value Date   VITAMINB12 387 08/28/2023  A/P: reasonable last year- update today   # Insomnia- imperfect contorl- not on anything lately other than zquill at times  Recommended follow up: Return in about 6 months (around 02/28/2025) for followup or sooner if needed.Schedule b4 you leave. Or at latest year for physical if blood pressure looks good at home   Lab/Order associations: fasting   ICD-10-CM   1. Preventative health care  Z00.00     2. Screening for diabetes mellitus  Z13.1 Hemoglobin A1c    3. Overweight  E66.3 Hemoglobin A1c    4. B12 deficiency  E53.8 Vitamin B12    5. Screening for prostate cancer  Z12.5 PSA    6. Essential hypertension  I10 Lipid panel    CBC with Differential/Platelet    Comprehensive metabolic panel    7. Hyperlipidemia, unspecified hyperlipidemia type  E78.5 Lipid panel    CBC with Differential/Platelet    Comprehensive metabolic panel      No orders of the defined types were placed in this encounter.   Return precautions advised.  Garnette Lukes, MD

## 2024-08-31 ENCOUNTER — Other Ambulatory Visit: Payer: Self-pay | Admitting: Family Medicine

## 2024-08-31 ENCOUNTER — Other Ambulatory Visit: Payer: Self-pay

## 2024-08-31 MED ORDER — ATORVASTATIN CALCIUM 20 MG PO TABS: 20.0000 mg | ORAL_TABLET | Freq: Every day | ORAL | 3 refills | Status: AC

## 2024-08-31 NOTE — Progress Notes (Signed)
 Atorvastatin  20mg  sent. 10mg  discontinued.

## 2024-09-02 NOTE — Telephone Encounter (Signed)
 Please review message from patient and advise. Tks

## 2024-09-09 ENCOUNTER — Encounter: Payer: Self-pay | Admitting: Family Medicine

## 2024-09-12 MED ORDER — AMLODIPINE BESYLATE 5 MG PO TABS: 5.0000 mg | ORAL_TABLET | Freq: Every day | ORAL | 3 refills | Status: AC

## 2024-09-21 ENCOUNTER — Other Ambulatory Visit: Payer: Self-pay | Admitting: Family Medicine

## 2025-02-28 ENCOUNTER — Ambulatory Visit: Admitting: Family Medicine

## 2025-08-31 ENCOUNTER — Encounter: Admitting: Family Medicine
